# Patient Record
Sex: Male | Born: 1945
Health system: Southern US, Community
[De-identification: ages and names within clinical notes are randomized; demographics above are authoritative.]

## PROBLEM LIST (undated history)

## (undated) DIAGNOSIS — M129 Arthropathy, unspecified: Secondary | ICD-10-CM

## (undated) DIAGNOSIS — M1990 Unspecified osteoarthritis, unspecified site: Secondary | ICD-10-CM

## (undated) DIAGNOSIS — E785 Hyperlipidemia, unspecified: Secondary | ICD-10-CM

## (undated) DIAGNOSIS — G20 Parkinson's disease: Secondary | ICD-10-CM

## (undated) DIAGNOSIS — G20A1 Parkinson's disease without dyskinesia, without mention of fluctuations: Secondary | ICD-10-CM

## (undated) DIAGNOSIS — I10 Essential (primary) hypertension: Secondary | ICD-10-CM

## (undated) HISTORY — DX: Hyperlipidemia, unspecified: E78.5

## (undated) HISTORY — DX: Unspecified osteoarthritis, unspecified site: M19.90

## (undated) HISTORY — DX: Essential (primary) hypertension: I10

## (undated) HISTORY — DX: Arthropathy, unspecified: M12.9

## (undated) HISTORY — PX: OTHER SURGICAL HISTORY: SHX169

---

## 1977-01-14 HISTORY — PX: HERNIA REPAIR: SHX51

## 1997-01-14 HISTORY — PX: SPINE SURGERY: SHX786

## 1998-10-01 ENCOUNTER — Ambulatory Visit (HOSPITAL_COMMUNITY): Admission: RE | Admit: 1998-10-01 | Discharge: 1998-10-01 | Payer: Self-pay | Admitting: Neurosurgery

## 2004-06-27 ENCOUNTER — Emergency Department (HOSPITAL_COMMUNITY): Admission: EM | Admit: 2004-06-27 | Discharge: 2004-06-27 | Payer: Self-pay | Admitting: Emergency Medicine

## 2008-01-25 ENCOUNTER — Encounter: Payer: Self-pay | Admitting: Family Medicine

## 2008-08-01 DIAGNOSIS — M129 Arthropathy, unspecified: Secondary | ICD-10-CM

## 2008-08-01 DIAGNOSIS — I10 Essential (primary) hypertension: Secondary | ICD-10-CM

## 2008-08-01 DIAGNOSIS — M1990 Unspecified osteoarthritis, unspecified site: Secondary | ICD-10-CM

## 2008-08-01 HISTORY — DX: Essential (primary) hypertension: I10

## 2008-08-01 HISTORY — DX: Arthropathy, unspecified: M12.9

## 2008-08-01 HISTORY — DX: Unspecified osteoarthritis, unspecified site: M19.90

## 2008-11-21 ENCOUNTER — Ambulatory Visit: Payer: Self-pay | Admitting: Family Medicine

## 2008-11-21 DIAGNOSIS — E785 Hyperlipidemia, unspecified: Secondary | ICD-10-CM | POA: Insufficient documentation

## 2008-11-21 HISTORY — DX: Hyperlipidemia, unspecified: E78.5

## 2008-11-22 LAB — CONVERTED CEMR LAB
Albumin: 4.2 g/dL (ref 3.5–5.2)
Alkaline Phosphatase: 53 units/L (ref 39–117)
Basophils Relative: 0.1 % (ref 0.0–3.0)
Bilirubin, Direct: 0.1 mg/dL (ref 0.0–0.3)
Calcium: 9.4 mg/dL (ref 8.4–10.5)
Chloride: 103 meq/L (ref 96–112)
Creatinine, Ser: 0.8 mg/dL (ref 0.4–1.5)
Eosinophils Absolute: 0.1 10*3/uL (ref 0.0–0.7)
GFR calc non Af Amer: 104.11 mL/min (ref >60)
Glucose, Bld: 99 mg/dL (ref 70–99)
HCT: 43.4 % (ref 39.0–52.0)
HDL: 53.7 mg/dL (ref >39.00)
MCV: 95.2 fL (ref 78.0–100.0)
Monocytes Absolute: 0.5 10*3/uL (ref 0.1–1.0)
Monocytes Relative: 9.7 % (ref 3.0–12.0)
RDW: 12.1 % (ref 11.5–14.6)
TSH: 0.87 microintl units/mL (ref 0.35–5.50)
Total Bilirubin: 1 mg/dL (ref 0.3–1.2)
Total CHOL/HDL Ratio: 4
Total Protein: 7.5 g/dL (ref 6.0–8.3)
Triglycerides: 92 mg/dL (ref 0.0–149.0)
VLDL: 18.4 mg/dL (ref 0.0–40.0)
WBC: 5 10*3/uL (ref 4.5–10.5)

## 2009-01-09 ENCOUNTER — Telehealth: Payer: Self-pay | Admitting: Family Medicine

## 2009-02-08 ENCOUNTER — Telehealth: Payer: Self-pay | Admitting: Family Medicine

## 2009-03-01 ENCOUNTER — Ambulatory Visit: Payer: Self-pay | Admitting: Family Medicine

## 2009-03-03 ENCOUNTER — Telehealth: Payer: Self-pay | Admitting: Family Medicine

## 2009-12-22 ENCOUNTER — Ambulatory Visit: Payer: Self-pay | Admitting: Family Medicine

## 2009-12-28 LAB — CONVERTED CEMR LAB
ALT: 21 units/L (ref 0–53)
AST: 22 units/L (ref 0–37)
Alkaline Phosphatase: 60 units/L (ref 39–117)
BUN: 20 mg/dL (ref 6–23)
Basophils Absolute: 0 10*3/uL (ref 0.0–0.1)
Basophils Relative: 0.4 % (ref 0.0–3.0)
CO2: 29 meq/L (ref 19–32)
Chloride: 105 meq/L (ref 96–112)
Cholesterol: 178 mg/dL (ref 0–200)
Creatinine, Ser: 0.8 mg/dL (ref 0.4–1.5)
Eosinophils Absolute: 0.1 10*3/uL (ref 0.0–0.7)
GFR calc non Af Amer: 103.74 mL/min (ref >60.00)
Glucose, Bld: 90 mg/dL (ref 70–99)
HCT: 40.7 % (ref 39.0–52.0)
Potassium: 5 meq/L (ref 3.5–5.1)
RDW: 12.9 % (ref 11.5–14.6)
TSH: 1 microintl units/mL (ref 0.35–5.50)
Total Bilirubin: 0.8 mg/dL (ref 0.3–1.2)
Total CHOL/HDL Ratio: 4
Total Protein: 6.5 g/dL (ref 6.0–8.3)
VLDL: 16.8 mg/dL (ref 0.0–40.0)
WBC: 4.8 10*3/uL (ref 4.5–10.5)

## 2010-01-03 ENCOUNTER — Ambulatory Visit: Payer: Self-pay | Admitting: Family Medicine

## 2010-01-16 ENCOUNTER — Telehealth: Payer: Self-pay | Admitting: Family Medicine

## 2010-02-13 NOTE — Progress Notes (Signed)
Summary: REQ FOR REFILL ON MED  Phone Note Call from Patient   Caller: Patient [phone] Reason for Call: Refill Medication Summary of Call: Pt called to adv that he needs a refill on med :  lisinopril 10mg  .....[facility] [phone]..... [facility] adv [facility] that he would need to call drs office to get RX refilled??  Initial call taken by: Debbra Riding,  February 08, 2009 8:52 AM    Prescriptions: LISINOPRIL 10 MG TABS (LISINOPRIL) once daily  #90 x 3   Entered by:   Sid Falcon LPN   Authorized by:   Evelena Peat MD   Signed by:   Sid Falcon LPN on 47/82/9562   Method used:   Electronically to        [facility] [phone][facility] (retail)       [address]       [place], [place]  [address]       Ph: [phone] or [phone]       Fax: [phone]   RxID:   [id number]

## 2010-02-13 NOTE — Progress Notes (Signed)
Summary: ? about meds  Phone Note Call from Patient   Caller: Patient Call For: Evelena Peat MD Summary of Call: C/o diarrhea from Amoxicillin- has taken for several days and sinuses feel better. Can he stop abx? ph- [phone] Initial call taken by: Raechel Ache, RN,  March 03, 2009 8:54 AM  Follow-up for Phone Call        yes Follow-up by: Evelena Peat MD,  March 03, 2009 9:16 AM  Additional Follow-up for Phone Call Additional follow up Details #1::        Phone Call Completed Additional Follow-up by: Raechel Ache, RN,  March 03, 2009 9:21 AM

## 2010-02-13 NOTE — Assessment & Plan Note (Signed)
Summary: SINUSITIS? // RS   Vital Signs:  Patient profile:   65 year old male Temp:     98.2 degrees F oral BP sitting:   120 / 72  (left arm) Cuff size:   regular  Vitals Entered By: Sid Falcon LPN (March 01, 2009 3:08 PM) CC: Sinusitis symptoms X 2 plus weeks   History of Present Illness: Acute visit. Almost two-week history of facial pain and sinus congestion symptoms and postnasal drip. Yellow to bloody tinged mucus intermittently. No fever. Generalized malaise. Has taken Benadryl and Tylenol without much improvement. Wife being scheduled for total hip replacement in March  Allergies (verified): No Known Drug Allergies  Past History:  Past Medical History: Last updated: 11/21/2008 Erectile Dysfunction  Hypertension Osteoarthritis Arthritis Chicken pox Hyperlipidemia PMH reviewed for relevance  Review of Systems      See HPI  Physical Exam  General:  Well-developed,well-nourished,in no acute distress; alert,appropriate and cooperative throughout examination Eyes:  no injection.   Ears:  External ear exam shows no significant lesions or deformities.  Otoscopic examination reveals clear canals, tympanic membranes are intact bilaterally without bulging, retraction, inflammation or discharge. Hearing is grossly normal bilaterally. Nose:  no visible nasal discharge. Nonspecific mucosal erythema Mouth:  Oral mucosa and oropharynx without lesions or exudates.  Teeth in good repair. Neck:  No deformities, masses, or tenderness noted. Lungs:  Normal respiratory effort, chest expands symmetrically. Lungs are clear to auscultation, no crackles or wheezes. Heart:  normal rate, regular rhythm, and no murmur.     Impression & Recommendations:  Problem # 1:  SINUSITIS, ACUTE (ICD-461.9)  His updated medication list for this problem includes:    Amoxicillin-pot Clavulanate 875-125 Mg Tabs (Amoxicillin-pot clavulanate) ..... One by mouth two times a day for 10  days  Complete Medication List: 1)  Lisinopril 10 Mg Tabs (Lisinopril) .... Once daily 2)  Meloxicam 15 Mg Tabs (Meloxicam) .... As needed 3)  Amoxicillin-pot Clavulanate 875-125 Mg Tabs (Amoxicillin-pot clavulanate) .... One by mouth two times a day for 10 days  Patient Instructions: 1)  Consider over-the-counter use of Netti pot for saline irrigation of sinuses. 2)  Acute sinusitis symptoms for less than 10 days are not helped by antibiotics. Use warm moist compresses, and over the counter decongestants( only as directed). Call if no improvement in 5-7 days, sooner if increasing pain, fever, or new symptoms.  Prescriptions: AMOXICILLIN-POT CLAVULANATE 875-125 MG TABS (AMOXICILLIN-POT CLAVULANATE) one by mouth two times a day for 10 days  #20 x 0   Entered and Authorized by:   Evelena Peat MD   Signed by:   Evelena Peat MD on 03/01/2009   Method used:   [facility]ronically to        [facility] [phone][facility] (retail)       [address]       [place], [place]  [address]       Ph: [phone] or [phone]       Fax: [phone]   RxID:   [id number]

## 2010-02-15 NOTE — Assessment & Plan Note (Signed)
Summary: sinuses/ccm   Vital Signs:  Patient profile:   65 year old male Weight:      186 pounds Temp:     98.1 degrees F oral BP sitting:   112 / 78  (left arm) Cuff size:   regular  Vitals Entered By: Sid Falcon LPN (January 03, 2010 1:50 PM)  History of Present Illness: Some bloody discharge.  Saline nasal without relief.       This is a 65 year old man who presents with URI symptoms.  The patient complains of nasal congestion, purulent nasal discharge, productive cough, and earache.  The patient denies fever, stiff neck, and dyspnea.  The patient also reports muscle aches and severe fatigue.  The patient denies response to antihistamine.  Risk factors for Strep sinusitis include tooth pain.     Allergies (verified): No Known Drug Allergies  Past History:  Past Medical History: Last updated: 11/21/2008 Erectile Dysfunction  Hypertension Osteoarthritis Arthritis Chicken pox Hyperlipidemia PMH reviewed for relevance  Physical Exam  General:  Well-developed,well-nourished,in no acute distress; alert,appropriate and cooperative throughout examination Ears:  External ear exam shows no significant lesions or deformities.  Otoscopic examination reveals clear canals, tympanic membranes are intact bilaterally without bulging, retraction, inflammation or discharge. Hearing is grossly normal bilaterally. Nose:  erythema and yellowish d/c. Mouth:  Oral mucosa and oropharynx without lesions or exudates.  Teeth in good repair. Neck:  No deformities, masses, or tenderness noted. Lungs:  Normal respiratory effort, chest expands symmetrically. Lungs are clear to auscultation, no crackles or wheezes. Heart:  Normal rate and regular rhythm. S1 and S2 normal without gallop, murmur, click, rub or other extra sounds.   Impression & Recommendations:  Problem # 1:  SINUSITIS, ACUTE (ICD-461.9)  His updated medication list for this problem includes:    Amoxicillin-pot Clavulanate  875-125 Mg Tabs (Amoxicillin-pot clavulanate) ..... One by mouth two times a day with food for 10 days  Complete Medication List: 1)  Lisinopril 10 Mg Tabs (Lisinopril) .... Once daily 2)  Meloxicam 15 Mg Tabs (Meloxicam) .... As needed 3)  Amoxicillin-pot Clavulanate 875-125 Mg Tabs (Amoxicillin-pot clavulanate) .... One by mouth two times a day with food for 10 days  Anticoagulation Management Assessment/Plan:            Patient Instructions: 1)  Acute sinusitis symptoms for less than 10 days are not helped by antibiotics. Use warm moist compresses, and over the counter decongestants( only as directed). Call if no improvement in 5-7 days, sooner if increasing pain, fever, or new symptoms.  Prescriptions: AMOXICILLIN-POT CLAVULANATE 875-125 MG TABS (AMOXICILLIN-POT CLAVULANATE) one by mouth two times a day with food for 10 days  #20 x 0   Entered and Authorized by:   Evelena Peat MD   Signed by:   Evelena Peat MD on 01/03/2010   Method used:   [facility]ronically to        [facility] [phone][facility] (retail)       [address]       [place], [place]  [address]       Ph: [phone] or [phone]       Fax: [phone]   RxID:   [id number]    Orders Added: 1)  Est. Patient Level III [347[address]]

## 2010-02-15 NOTE — Assessment & Plan Note (Signed)
Summary: CPX (PT WILL COME IN FASTING) // RS   Vital Signs:  Patient profile:   65 year old male Height:      73.5 inches Weight:      189 pounds BMI:     24.69 O2 Sat:      97 % Temp:     97.6 degrees F Pulse rate:   58 / minute BP sitting:   126 / 80  (left arm) Cuff size:   regular  Vitals Entered By: Pura Spice, RN (December 22, 2009 8:45 AM) CC: cpx needs refills fasting for labs    History of Present Illness: Here for CPE.  PMH, SH, and FH reviewed. Pt refuses colonoscopy screening and understands risk of not screeing.  No regular exercise.   Clinical Review Panels:  Prevention   Last PSA:  0.44 (11/21/2008)  Immunizations   Last Tetanus Booster:  Historical (01/14/2006)  Lipid Management   Cholesterol:  188 (11/21/2008)   LDL (bad choesterol):  116 (11/21/2008)   HDL (good cholesterol):  53.70 (11/21/2008)  Diabetes Management   Creatinine:  0.8 (11/21/2008)  CBC   WBC:  5.0 (11/21/2008)   RBC:  4.56 (11/21/2008)   Hgb:  14.6 (11/21/2008)   Hct:  43.4 (11/21/2008)   Platelets:  230.0 (11/21/2008)   MCV  95.2 (11/21/2008)   MCHC  33.7 (11/21/2008)   RDW  12.1 (11/21/2008)   PMN:  60.5 (11/21/2008)   Lymphs:  28.6 (11/21/2008)   Monos:  9.7 (11/21/2008)   Eosinophils:  1.1 (11/21/2008)   Basophil:  0.1 (11/21/2008)  Complete Metabolic Panel   Glucose:  99 (11/21/2008)   Sodium:  142 (11/21/2008)   Potassium:  4.7 (11/21/2008)   Chloride:  103 (11/21/2008)   CO2:  31 (11/21/2008)   BUN:  15 (11/21/2008)   Creatinine:  0.8 (11/21/2008)   Albumin:  4.2 (11/21/2008)   Total Protein:  7.5 (11/21/2008)   Calcium:  9.4 (11/21/2008)   Total Bili:  1.0 (11/21/2008)   Alk Phos:  53 (11/21/2008)   SGPT (ALT):  20 (11/21/2008)   SGOT (AST):  26 (11/21/2008)   Allergies (verified): No Known Drug Allergies  Contraindications/Deferment of Procedures/Staging:    Test/Procedure: FLU VAX    Reason for deferment: patient declined   Past  History:  Past Medical History: Last updated: 11/21/2008 Erectile Dysfunction  Hypertension Osteoarthritis Arthritis Chicken pox Hyperlipidemia  Past Surgical History: Last updated: 08/01/2008 Inguinal Hernia Repair- 1979  lumbosacral dsk surgery- 1999   Family History: Last updated: 11/21/2008 Family history Leukemia  Family history Stroke  Family History of Alcoholism/Addiction Family History of Arthritis Family History Hypertension  Social History: Last updated: 11/21/2008 Never Smoked Alcohol use-no Occupation:  Engineer, mining Regular exercise-yes  Risk Factors: Exercise: yes (11/21/2008)  Risk Factors: Smoking Status: never (08/01/2008) PMH-FH-SH reviewed for relevance  Review of Systems  The patient denies anorexia, fever, weight loss, weight gain, vision loss, decreased hearing, hoarseness, chest pain, syncope, dyspnea on exertion, peripheral edema, prolonged cough, headaches, hemoptysis, abdominal pain, melena, hematochezia, severe indigestion/heartburn, hematuria, incontinence, genital sores, muscle weakness, suspicious skin lesions, transient blindness, difficulty walking, depression, unusual weight change, abnormal bleeding, enlarged lymph nodes, and testicular masses.         has occasional urine urgency but no incontinence. Arthritic pains mostly on the feet and hands. Occasional leg cramps  Physical Exam  General:  Well-developed,well-nourished,in no acute distress; alert,appropriate and cooperative throughout examination Head:  Normocephalic and atraumatic without obvious abnormalities.  No apparent alopecia or balding. Eyes:  No corneal or conjunctival inflammation noted. EOMI. Perrla. Funduscopic exam benign, without hemorrhages, exudates or papilledema. Vision grossly normal. Ears:  External ear exam shows no significant lesions or deformities.  Otoscopic examination reveals clear canals, tympanic membranes are intact bilaterally without  bulging, retraction, inflammation or discharge. Hearing is grossly normal bilaterally. Mouth:  Oral mucosa and oropharynx without lesions or exudates.  Teeth in good repair. Neck:  No deformities, masses, or tenderness noted. Lungs:  Normal respiratory effort, chest expands symmetrically. Lungs are clear to auscultation, no crackles or wheezes. Heart:  Normal rate and regular rhythm. S1 and S2 normal without gallop, murmur, click, rub or other extra sounds. Abdomen:  Bowel sounds positive,abdomen soft and non-tender without masses, organomegaly or hernias noted. Rectal:  No external abnormalities noted. Normal sphincter tone. No rectal masses or tenderness. Prostate:  Prostate gland firm and smooth, no enlargement, nodularity, tenderness, mass, asymmetry or induration. Msk:  No deformity or scoliosis noted of thoracic or lumbar spine.   Extremities:  patient has some hypertrophic changes left foot-related arthritis Neurologic:  alert & oriented X3, cranial nerves II-XII intact, and strength normal in all extremities.   Skin:  no rashes and no suspicious lesions.   Cervical Nodes:  No lymphadenopathy noted Psych:  normally interactive, good eye contact, not anxious appearing, and not depressed appearing.     Impression & Recommendations:  Problem # 1:  ROUTINE GENERAL MEDICAL [email or web address]@[email or web address]  CARE FACL (ICD-V70.0) obtain screening labs.  Discussed Zostavax and he is not interested.  He refuses colonoscopy. Orders: Venipuncture (40347) Specimen Handling (42595) TLB-Lipid Panel (80061-LIPID) TLB-BMP (Basic Metabolic Panel-BMET) (80048-METABOL) TLB-CBC Platelet - w/Differential (85025-CBCD) TLB-Hepatic/Liver Function Pnl (80076-HEPATIC) TLB-TSH (Thyroid Stimulating Hormone) (84443-TSH) TLB-PSA (Prostate Specific Antigen) (84153-PSA)  Complete Medication List: 1)  Lisinopril 10 Mg Tabs (Lisinopril) .... Once daily 2)  Meloxicam 15 Mg Tabs (Meloxicam) .... As needed  Patient Instructions: 1)   Please schedule a follow-up appointment in 1 year.  Prescriptions: MELOXICAM 15 MG TABS (MELOXICAM) as needed  #30 x 6   Entered and Authorized by:   Evelena Peat MD   Signed by:   Evelena Peat MD on 12/22/2009   Method used:   [facility] to        [facility] [phone][facility] (retail)       [address]       [place], [place]  [address]       Ph: [phone] or [phone]       Fax: [phone]   RxID:   [id number] LISINOPRIL 10 MG TABS (LISINOPRIL) once daily  #90 x 3   Entered and Authorized by:   Evelena Peat MD   Signed by:   Evelena Peat MD on 12/22/2009   Method used:   Electronically to        [facility] [phone]* (retail)       [address]       [place], [place]  [address]       Ph: [phone] or [phone]       Fax: [phone]   RxID:   [id number]    Orders Added: 1)  Est. Patient 40-64 years [99396] 2)  Venipuncture [85277] 3)  Specimen Handling [99000] 4)  TLB-Lipid Panel [80061-LIPID] 5)  TLB-BMP (Basic Metabolic Panel-BMET) [80048-METABOL] 6)  TLB-CBC Platelet - w/Differential [85025-CBCD] 7)  TLB-Hepatic/Liver Function Pnl [80076-HEPATIC] 8)  TLB-TSH (Thyroid Stimulating Hormone) [84443-TSH] 9)  TLB-PSA (Prostate Specific Antigen) [82423-NTI]

## 2010-02-15 NOTE — Progress Notes (Signed)
Summary: please advise  of sinuses  Phone Note Call from Patient Call back at Work Phone [phone]   Caller: Patient---live call Summary of Call: was given amoxil for sinuses. not any better. still coughing and draining down throat. please advise.      [facility] Initial call taken by: Warnell Forester,  January 16, 2010 11:32 AM  Follow-up for Phone Call        residual cough is very common following acute sinusitis and further antibiotics might not help.  IF he is having any persistent bloody nasal drainage, purulent secretions, or facial pain start Levaquin 500 mg by mouth once daily for 7 days and needs to be seen if no better after that. Follow-up by: Evelena Peat MD,  January 16, 2010 6:12 PM  Additional Follow-up for Phone Call Additional follow up Details #1::        Pt informed on personally identified VM, Rx called in Additional Follow-up by: Sid Falcon LPN,  January 17, 2010 12:43 PM    New/Updated Medications: LEVAQUIN 500 MG TABS (LEVOFLOXACIN) one tab daily for 7 days Prescriptions: LEVAQUIN 500 MG TABS (LEVOFLOXACIN) one tab daily for 7 days  #7 x 0   Entered by:   Sid Falcon LPN   Authorized by:   Evelena Peat MD   Signed by:   Sid Falcon LPN on 62/13/0865   Method used:   Electronically to        [facility] [phone]* (retail)       [address]       [place], [place]  [address]       Ph: [phone] or [phone]       Fax: [phone]   RxID:   [id number]

## 2011-01-01 ENCOUNTER — Other Ambulatory Visit: Payer: Self-pay | Admitting: *Deleted

## 2011-01-01 MED ORDER — LISINOPRIL 10 MG PO TABS: 10.0000 mg | ORAL_TABLET | Freq: Every day | ORAL | Status: DC

## 2011-01-29 ENCOUNTER — Other Ambulatory Visit (INDEPENDENT_AMBULATORY_CARE_PROVIDER_SITE_OTHER): Payer: BC Managed Care – PPO

## 2011-01-29 DIAGNOSIS — Z0000 Encounter for general adult medical examination without abnormal findings: Secondary | ICD-10-CM

## 2011-01-29 LAB — CBC WITH DIFFERENTIAL/PLATELET
Lymphocytes Relative: 33.9 % (ref 12.0–46.0)
Lymphs Abs: 1.7 10*3/uL (ref 0.7–4.0)
MCV: 93.7 fl (ref 78.0–100.0)
Monocytes Absolute: 0.7 10*3/uL (ref 0.1–1.0)
Monocytes Relative: 13.1 % — ABNORMAL HIGH (ref 3.0–12.0)
Neutro Abs: 2.5 10*3/uL (ref 1.4–7.7)
Neutrophils Relative %: 50.8 % (ref 43.0–77.0)
WBC: 5 10*3/uL (ref 4.5–10.5)

## 2011-01-29 LAB — POCT URINALYSIS DIPSTICK
Bilirubin, UA: NEGATIVE
Blood, UA: NEGATIVE
Glucose, UA: NEGATIVE
Ketones, UA: NEGATIVE
Leukocytes, UA: NEGATIVE
Nitrite, UA: NEGATIVE
Protein, UA: NEGATIVE
Spec Grav, UA: 1.02
Urobilinogen, UA: 0.2
pH, UA: 7

## 2011-01-29 LAB — TSH: TSH: 1.32 u[IU]/mL (ref 0.35–5.50)

## 2011-01-29 LAB — HEPATIC FUNCTION PANEL
ALT: 22 U/L (ref 0–53)
AST: 25 U/L (ref 0–37)
Albumin: 4 g/dL (ref 3.5–5.2)
Alkaline Phosphatase: 63 U/L (ref 39–117)
Bilirubin, Direct: 0.1 mg/dL (ref 0.0–0.3)
Total Bilirubin: 0.7 mg/dL (ref 0.3–1.2)
Total Protein: 6.8 g/dL (ref 6.0–8.3)

## 2011-01-29 LAB — BASIC METABOLIC PANEL
BUN: 18 mg/dL (ref 6–23)
Creatinine, Ser: 1 mg/dL (ref 0.4–1.5)
Sodium: 141 mEq/L (ref 135–145)

## 2011-01-29 LAB — LIPID PANEL
HDL: 49.4 mg/dL (ref >39.00)
LDL Cholesterol: 123 mg/dL — ABNORMAL HIGH (ref 0–99)
Total CHOL/HDL Ratio: 4
Triglycerides: 112 mg/dL (ref 0.0–149.0)
VLDL: 22.4 mg/dL (ref 0.0–40.0)

## 2011-01-29 LAB — PSA: PSA: 0.54 ng/mL (ref 0.10–4.00)

## 2011-02-05 ENCOUNTER — Other Ambulatory Visit: Payer: Self-pay | Admitting: Family Medicine

## 2011-02-07 ENCOUNTER — Encounter: Payer: Self-pay | Admitting: Family Medicine

## 2011-02-07 ENCOUNTER — Ambulatory Visit (INDEPENDENT_AMBULATORY_CARE_PROVIDER_SITE_OTHER): Payer: BC Managed Care – PPO | Admitting: Family Medicine

## 2011-02-07 VITALS — BP 150/80 | HR 80 | Temp 97.5°F | Resp 12 | Ht 71.75 in | Wt 187.0 lb

## 2011-02-07 DIAGNOSIS — Z0000 Encounter for general adult medical examination without abnormal findings: Secondary | ICD-10-CM

## 2011-02-07 DIAGNOSIS — E785 Hyperlipidemia, unspecified: Secondary | ICD-10-CM

## 2011-02-07 DIAGNOSIS — M25511 Pain in right shoulder: Secondary | ICD-10-CM

## 2011-02-07 DIAGNOSIS — R3915 Urgency of urination: Secondary | ICD-10-CM

## 2011-02-07 DIAGNOSIS — M25519 Pain in unspecified shoulder: Secondary | ICD-10-CM

## 2011-02-07 DIAGNOSIS — M1990 Unspecified osteoarthritis, unspecified site: Secondary | ICD-10-CM

## 2011-02-07 DIAGNOSIS — I10 Essential (primary) hypertension: Secondary | ICD-10-CM

## 2011-02-07 MED ORDER — LISINOPRIL 20 MG PO TABS: 20.0000 mg | ORAL_TABLET | Freq: Every day | ORAL | Status: DC

## 2011-02-07 NOTE — Progress Notes (Signed)
Subjective:    Patient ID: Bryce Wood, male    DOB: 06-Jun-1945, 66 y.o.   MRN: [id number]  HPI  Patient seen for complete physical and also to evaluate for some medical problems.  His history of hyperlipidemia, hypertension, and osteoarthritis. He remains on meloxicam 15 mg daily and lisinopril 10 mg daily. Not monitoring blood pressures. Has had some mild headache past couple days. No chest pains or dizziness.  Right shoulder pain for months. Does a lot of lifting. No injury. Increased night pain. Pain occasionally radiates toward deltoid. Pain with internal rotation and abduction. No clear weakness.  No associated neck pain.  Urinary urgency over the past couple of years. No burning with urination. No obstructive symptoms. No history of BPH.  No fever or chills.  Intermittent left foot pain. He has bony hypertrophic changes of the foot and has previously seen podiatrist with x-rays. Osteoarthritic changes. Managing with nonsteroidal. He is reluctant to consider surgery.  Health maintenance issues addressed. He refuses colonoscopy. Needs Pneumovax for next year. Is not sure if he has coverage for shingles vaccine but has previously had chickenpox.  Past Medical History  Diagnosis Date  . HYPERLIPIDEMIA 11/21/2008  . HYPERTENSION 08/01/2008  . OSTEOARTHRITIS 08/01/2008  . ARTHRITIS 08/01/2008   Past Surgical History  Procedure Date  . Hernia repair 1979  . Spine surgery 1999    disk    reports that he has never smoked. He does not have any smokeless tobacco history on file. His alcohol and drug histories not on file. family history includes Alcohol abuse in his other; Arthritis in his other; Cancer (age of onset:65) in his paternal uncle; Hypertension in his other; Leukemia in his other; and Stroke in his other. No Known Allergies    Review of Systems  Constitutional: Negative for fever, activity change, appetite change and fatigue.  HENT: Negative for ear pain, congestion and  trouble swallowing.   Eyes: Negative for pain and visual disturbance.  Respiratory: Negative for cough, shortness of breath and wheezing.   Cardiovascular: Negative for chest pain and palpitations.  Gastrointestinal: Negative for nausea, vomiting, abdominal pain, diarrhea, constipation, blood in stool, abdominal distention and rectal pain.  Genitourinary: Negative for dysuria, hematuria and testicular pain.  Musculoskeletal: Positive for arthralgias (Right shoulder). Negative for joint swelling.  Skin: Negative for rash.  Neurological: Negative for dizziness, syncope and headaches.  Hematological: Negative for adenopathy.  Psychiatric/Behavioral: Negative for confusion and dysphoric mood.       Objective:   Physical Exam  Constitutional: He is oriented to person, place, and time. He appears well-developed and well-nourished. No distress.  HENT:  Head: Normocephalic and atraumatic.  Right Ear: External ear normal.  Left Ear: External ear normal.  Mouth/Throat: Oropharynx is clear and moist.  Eyes: Conjunctivae and EOM are normal. Pupils are equal, round, and reactive to light.  Neck: Normal range of motion. Neck supple. No thyromegaly present.  Cardiovascular: Normal rate, regular rhythm and normal heart sounds.   No murmur heard. Pulmonary/Chest: No respiratory distress. He has no wheezes. He has no rales.  Abdominal: Soft. Bowel sounds are normal. He exhibits no distension and no mass. There is no tenderness. There is no rebound and no guarding.  Genitourinary: Rectum normal and prostate normal.  Musculoskeletal: He exhibits no edema.       No upper extremity muscle atrophy. Full range of motion right shoulder but pain with internal rotation and abduction. No definite weakness. No a.c. joint tenderness. No biceps tenderness.  Lymphadenopathy:    He has no cervical adenopathy.  Neurological: He is alert and oriented to person, place, and time. He displays normal reflexes. No cranial  nerve deficit.  Skin: No rash noted.  Psychiatric: He has a normal mood and affect.          Assessment & Plan:  #1 health maintenance. Labs reviewed with patient and favorable. Check on coverage for shingles vaccine. Patient refuses colonoscopy. Pneumovax next year #2 urinary urgency. Recent urine dipstick unremarkable. Discussed possible medication options but is not interested #3 left foot pain. Hypertrophic osteoarthritic changes. He will consider orthopedic referral if pain progresses  #4 hypertension. Poorly controlled. Titrate lisinopril 20 mg daily and reassess blood pressure one month #5 right shoulder pain. Suspect rotator cuff tendinitis. No definite weakness.  Discussed risks and benefits of corticosteroid injection and patient consented.  After prepping skin with betadine, injected 40 mg depomedrol and 2 cc of plain xylocaine with 23 gauge one and one half inch needle using posterior lateral approach and pt tolerate well. Reassess one month and x-rays then if no improvement

## 2011-03-13 ENCOUNTER — Encounter: Payer: Self-pay | Admitting: Family Medicine

## 2011-03-13 ENCOUNTER — Ambulatory Visit (INDEPENDENT_AMBULATORY_CARE_PROVIDER_SITE_OTHER): Payer: BC Managed Care – PPO | Admitting: Family Medicine

## 2011-03-13 DIAGNOSIS — M19072 Primary osteoarthritis, left ankle and foot: Secondary | ICD-10-CM

## 2011-03-13 DIAGNOSIS — I10 Essential (primary) hypertension: Secondary | ICD-10-CM

## 2011-03-13 DIAGNOSIS — M19079 Primary osteoarthritis, unspecified ankle and foot: Secondary | ICD-10-CM

## 2011-03-13 NOTE — Progress Notes (Signed)
  Subjective:    Patient ID: Bryce Wood, male    DOB: [birth year], 65 y.o.   MRN: [id number]  HPI  Followup hypertension. Recent titration lisinopril 20 mg daily. Tolerating well. Blood pressure is well controlled by home readings. Mostly 120s systolic and 70s diastolic. No dizziness. No headaches.  Patient has hypertrophic osteoarthritis changes left midfoot.  Has previously seen podiatrist. Starting to have more pain with ambulation. Works on his feet several hours per day. Requesting orthopedic referral.  Past Medical History  Diagnosis Date  . HYPERLIPIDEMIA 11/21/2008  . HYPERTENSION 08/01/2008  . OSTEOARTHRITIS 08/01/2008  . ARTHRITIS 08/01/2008   Past Surgical History  Procedure Date  . Hernia repair 1979  . Spine surgery 1999    disk    reports that he has never smoked. He does not have any smokeless tobacco history on file. His alcohol and drug histories not on file. family history includes Alcohol abuse in his other; Arthritis in his other; Cancer (age of onset:65) in his paternal uncle; Hypertension in his other; Leukemia in his other; and Stroke in his other. No Known Allergies   Review of Systems  Constitutional: Negative for fatigue.  Eyes: Negative for visual disturbance.  Respiratory: Negative for cough, chest tightness and shortness of breath.   Cardiovascular: Negative for chest pain, palpitations and leg swelling.  Neurological: Negative for dizziness, syncope, weakness, light-headedness and headaches.       Objective:   Physical Exam  Constitutional: He appears well-developed and well-nourished.  Cardiovascular: Normal rate and regular rhythm.   No murmur heard. Pulmonary/Chest: Effort normal. No respiratory distress. He has no wheezes. He has no rales.  Musculoskeletal:       Patient has hypertrophic bony changes left midfoot. No erythema. No warmth. Nontender.          Assessment & Plan:  #1 hypertension improved continue lisinopril 20 mg daily  continue home monitoring #2 hypertrophic ?osteoarthritis changes left foot refer to orthopedist

## 2011-03-20 ENCOUNTER — Encounter: Payer: Self-pay | Admitting: Family Medicine

## 2011-03-20 ENCOUNTER — Ambulatory Visit (INDEPENDENT_AMBULATORY_CARE_PROVIDER_SITE_OTHER): Payer: BC Managed Care – PPO | Admitting: Family Medicine

## 2011-03-20 VITALS — BP 138/90 | Temp 98.0°F | Wt 190.0 lb

## 2011-03-20 DIAGNOSIS — J329 Chronic sinusitis, unspecified: Secondary | ICD-10-CM

## 2011-03-20 MED ORDER — AMOXICILLIN 875 MG PO TABS: 875.0000 mg | ORAL_TABLET | Freq: Two times a day (BID) | ORAL | Status: DC

## 2011-03-20 NOTE — Patient Instructions (Signed)

## 2011-03-20 NOTE — Progress Notes (Signed)
  Subjective:    Patient ID: Bryce Wood, male    DOB: [birth year], 66 y.o.   MRN: [id number]  HPI  Acute visit. Patient relates progressive sinus symptoms. He's had almost 2 weeks of frontal sinus pressure occasional bloody nasal discharge. Occasional yellow tinged mucus. Mild intermittent headaches. Has taken Allegra without much relief. Inconsistent use of nasal saline. Minimal cough. No fever or chills. Has not tried any decongestants  Review of Systems  Constitutional: Negative for fever and chills.  HENT: Positive for congestion and sinus pressure. Negative for sore throat and voice change.   Respiratory: Negative for cough and shortness of breath.   Neurological: Positive for headaches.       Objective:   Physical Exam  Constitutional: He appears well-developed and well-nourished.  HENT:  Right Ear: External ear normal.  Left Ear: External ear normal.  Mouth/Throat: Oropharynx is clear and moist.  Neck: Neck supple.  Cardiovascular: Normal rate and regular rhythm.   Pulmonary/Chest: Effort normal and breath sounds normal. No respiratory distress. He has no wheezes. He has no rales.          Assessment & Plan:  Acute sinusitis. Amoxicillin 875 mg twice daily for 10 days. Continue saline nasal irrigation

## 2011-03-28 ENCOUNTER — Telehealth: Payer: Self-pay | Admitting: Family Medicine

## 2011-03-28 MED ORDER — AZITHROMYCIN 250 MG PO TABS
ORAL_TABLET | ORAL | Status: AC
Start: 2011-03-28 — End: 2011-04-02

## 2011-03-28 NOTE — Telephone Encounter (Signed)
Pt was give Amoxil on 3-6, #20 BID Please advise

## 2011-03-28 NOTE — Telephone Encounter (Signed)
Pt informed and voiced understanding

## 2011-03-28 NOTE — Telephone Encounter (Signed)
Okay to try decongestant.  Change to Zithromax (Z-pack)

## 2011-03-28 NOTE — Telephone Encounter (Signed)
Was was in a week ago and was put on amoxicillin (AMOXIL) 875 MG tablet, pt has one pill left and still is not feeling better, pt is still experiencing a lot of congestion and would like to know he another rx could be called in for him. Pt also would like to know if he shouldl be taking a decongestant. Please contact  [facility]

## 2011-05-16 ENCOUNTER — Encounter: Payer: Self-pay | Admitting: Family Medicine

## 2011-05-16 ENCOUNTER — Ambulatory Visit (INDEPENDENT_AMBULATORY_CARE_PROVIDER_SITE_OTHER): Payer: BC Managed Care – PPO | Admitting: Family Medicine

## 2011-05-16 VITALS — BP 110/68 | Temp 97.8°F | Wt 190.0 lb

## 2011-05-16 DIAGNOSIS — M25511 Pain in right shoulder: Secondary | ICD-10-CM

## 2011-05-16 DIAGNOSIS — M25519 Pain in unspecified shoulder: Secondary | ICD-10-CM

## 2011-05-16 NOTE — Progress Notes (Addendum)
  Subjective:    Patient ID: Bryce Wood, male    DOB: 03-Oct-1945, 66 y.o.   MRN: [id number]  HPI  Recurrent right shoulder pain. No injury. Pain radiates toward the right deltoid. Worse with abduction. Back in January with his physical we had injected this with cortisone he had great improvement and has only recently had some recurrence. Occasional night pain. No recent injury. No radiculopathy features. No weakness. No alleviating factors. Exacerbated by reaching overhead. No numbness or right upper extremity weakness.   Review of Systems  Constitutional: Negative for fever and appetite change.  HENT: Negative for neck pain.   Hematological: Negative for adenopathy.       Objective:   Physical Exam  Constitutional: He appears well-developed and well-nourished.  Cardiovascular: Normal rate and regular rhythm.   Pulmonary/Chest: Effort normal and breath sounds normal. No respiratory distress. He has no wheezes. He has no rales.  Musculoskeletal:       Right shoulder reveals no visible swelling. No localized tenderness. Full range of motion. Pain with abduction greater than 80 and 90 with resistance. No biceps tenderness. No pain with internal rotation or external rotation          Assessment & Plan:  Recurrent right shoulder pain. Suspect recurrent tendinitis.  Discussed risks and benefits of corticosteroid injection and patient consented.  After prepping skin with betadine, injected 40 mg depomedrol and 2 cc of plain xylocaine with 22 gauge one and one half inch needle using posterior lateral approach and pt tolerate well.  Discussed some simple rotator cuff exercises for stretching and strengthening. X-rays if not greatly improved in 2 weeks   Ongoing shoulder pain.  Will refer to [facility] ortho group.

## 2011-06-07 ENCOUNTER — Ambulatory Visit (INDEPENDENT_AMBULATORY_CARE_PROVIDER_SITE_OTHER)
Admission: RE | Admit: 2011-06-07 | Discharge: 2011-06-07 | Disposition: A | Discharge status: Home / Self Care | Payer: BC Managed Care – PPO | Source: Ambulatory Visit | Attending: Family Medicine | Admitting: Family Medicine

## 2011-06-07 DIAGNOSIS — M25519 Pain in unspecified shoulder: Secondary | ICD-10-CM

## 2011-06-07 DIAGNOSIS — M25511 Pain in right shoulder: Secondary | ICD-10-CM

## 2011-06-11 NOTE — Progress Notes (Signed)
Addended by: Kristian Covey on: 06/11/2011 04:08 PM   Modules accepted: Orders

## 2011-07-04 ENCOUNTER — Other Ambulatory Visit: Payer: Self-pay | Admitting: Orthopedic Surgery

## 2011-07-04 DIAGNOSIS — M25511 Pain in right shoulder: Secondary | ICD-10-CM

## 2011-07-08 ENCOUNTER — Ambulatory Visit
Admission: RE | Admit: 2011-07-08 | Discharge: 2011-07-08 | Disposition: A | Discharge status: Home / Self Care | Payer: BC Managed Care – PPO | Source: Ambulatory Visit | Attending: Orthopedic Surgery | Admitting: Orthopedic Surgery

## 2011-07-08 DIAGNOSIS — M25511 Pain in right shoulder: Secondary | ICD-10-CM

## 2011-07-11 ENCOUNTER — Other Ambulatory Visit: Payer: BC Managed Care – PPO

## 2012-02-04 ENCOUNTER — Other Ambulatory Visit (INDEPENDENT_AMBULATORY_CARE_PROVIDER_SITE_OTHER): Payer: BC Managed Care – PPO

## 2012-02-04 DIAGNOSIS — D649 Anemia, unspecified: Secondary | ICD-10-CM

## 2012-02-04 DIAGNOSIS — N400 Enlarged prostate without lower urinary tract symptoms: Secondary | ICD-10-CM

## 2012-02-04 DIAGNOSIS — E039 Hypothyroidism, unspecified: Secondary | ICD-10-CM

## 2012-02-04 DIAGNOSIS — E785 Hyperlipidemia, unspecified: Secondary | ICD-10-CM

## 2012-02-04 DIAGNOSIS — Z0000 Encounter for general adult medical examination without abnormal findings: Secondary | ICD-10-CM

## 2012-02-04 DIAGNOSIS — I10 Essential (primary) hypertension: Secondary | ICD-10-CM

## 2012-02-04 LAB — LIPID PANEL: VLDL: 26.6 mg/dL (ref 0.0–40.0)

## 2012-02-04 LAB — POCT URINALYSIS DIPSTICK
Bilirubin, UA: NEGATIVE
Blood, UA: NEGATIVE
Glucose, UA: NEGATIVE
Urobilinogen, UA: 0.2
pH, UA: 7

## 2012-02-04 LAB — CBC WITH DIFFERENTIAL/PLATELET
Basophils Relative: 0.6 % (ref 0.0–3.0)
Eosinophils Relative: 1.6 % (ref 0.0–5.0)
MCV: 91.4 fl (ref 78.0–100.0)
Monocytes Absolute: 0.6 10*3/uL (ref 0.1–1.0)
Platelets: 281 10*3/uL (ref 150.0–400.0)
WBC: 5.9 10*3/uL (ref 4.5–10.5)

## 2012-02-04 LAB — HEPATIC FUNCTION PANEL
ALT: 20 U/L (ref 0–53)
Alkaline Phosphatase: 64 U/L (ref 39–117)
Bilirubin, Direct: 0 mg/dL (ref 0.0–0.3)
Total Bilirubin: 0.6 mg/dL (ref 0.3–1.2)

## 2012-02-04 LAB — BASIC METABOLIC PANEL
BUN: 19 mg/dL (ref 6–23)
CO2: 28 mEq/L (ref 19–32)
Calcium: 9.3 mg/dL (ref 8.4–10.5)
Creatinine, Ser: 0.8 mg/dL (ref 0.4–1.5)
Glucose, Bld: 98 mg/dL (ref 70–99)

## 2012-02-11 ENCOUNTER — Other Ambulatory Visit: Payer: Self-pay | Admitting: Family Medicine

## 2012-02-11 ENCOUNTER — Encounter: Payer: Self-pay | Admitting: Family Medicine

## 2012-02-11 ENCOUNTER — Ambulatory Visit (INDEPENDENT_AMBULATORY_CARE_PROVIDER_SITE_OTHER): Payer: BC Managed Care – PPO | Admitting: Family Medicine

## 2012-02-11 VITALS — BP 120/70 | HR 72 | Temp 97.6°F | Resp 12 | Ht 72.5 in | Wt 199.0 lb

## 2012-02-11 DIAGNOSIS — E785 Hyperlipidemia, unspecified: Secondary | ICD-10-CM

## 2012-02-11 DIAGNOSIS — Z23 Encounter for immunization: Secondary | ICD-10-CM

## 2012-02-11 DIAGNOSIS — Z0000 Encounter for general adult medical examination without abnormal findings: Secondary | ICD-10-CM

## 2012-02-11 DIAGNOSIS — I10 Essential (primary) hypertension: Secondary | ICD-10-CM

## 2012-02-11 MED ORDER — LISINOPRIL 20 MG PO TABS: 20.0000 mg | ORAL_TABLET | Freq: Every day | ORAL | Status: DC

## 2012-02-11 NOTE — Progress Notes (Signed)
Subjective:    Patient ID: Bryce Wood, male    DOB: [birth year], 67 y.o.   MRN: [id number]  HPI Patient here for complete physical. He has hypertension treated with lisinopril 20 mg daily. Blood pressure stable. Mild hyperlipidemia. No hx of CAD or PVD .  Osteoarthritis and takes glucosamine Chondroitin which helps.No Pneumovax.:  No hx of Shingles vaccine. He has declined colonoscopy screening in the past. Tetanus is up-to-date.  Past Medical History  Diagnosis Date  . HYPERLIPIDEMIA 11/21/2008  . HYPERTENSION 08/01/2008  . OSTEOARTHRITIS 08/01/2008  . ARTHRITIS 08/01/2008   Past Surgical History  Procedure Date  . Hernia repair 1979  . Spine surgery 1999    disk    reports that he has never smoked. He does not have any smokeless tobacco history on file. His alcohol and drug histories not on file. family history includes Alcohol abuse in his other; Arthritis in his other; Cancer (age of onset:65) in his paternal uncle; Hypertension in his other; Leukemia in his other; and Stroke in his other. No Known Allergies  1.  Risk factors based on Past Medical , Social, and Family history reviewed and as above. 2.  Limitations in physical activities None.  Works full time.  Walks some for exercise.  Low risk for falls. 3.  Depression/mood no mood or anxiety issues. 4.  Hearing No significant deficits. 5.  ADLs independent in all. 6.  Cognitive function (orientation to time and place, language, writing, speech,memory) no deficits. 7.  Home Safety no issues identified. 8.  Height, weight, and visual acuity. All stable. 9.  Counseling discussed reduction of sugars and starches and more regular exercise. 10. Recommendation of preventive services. Discussed colonoscopy, shingles vaccine and flu vaccine and he declines all. 11. Labs based on risk factors lipids, hepatic, PSA, TSH, CBC, BMP all reviewed with pt. 12. Care Plan as above.    Review of Systems  Constitutional: Negative for fever,  activity change, appetite change and fatigue.  HENT: Negative for ear pain, congestion and trouble swallowing.   Eyes: Negative for pain and visual disturbance.  Respiratory: Negative for cough, shortness of breath and wheezing.   Cardiovascular: Negative for chest pain and palpitations.  Gastrointestinal: Negative for nausea, vomiting, abdominal pain, diarrhea, constipation, blood in stool, abdominal distention and rectal pain.  Genitourinary: Negative for dysuria, hematuria and testicular pain.  Musculoskeletal: Negative for joint swelling and arthralgias.  Skin: Negative for rash.  Neurological: Negative for dizziness, syncope and headaches.  Hematological: Negative for adenopathy.  Psychiatric/Behavioral: Negative for confusion and dysphoric mood.       Objective:   Physical Exam  Constitutional: He is oriented to person, place, and time. He appears well-developed and well-nourished. No distress.  HENT:  Head: Normocephalic and atraumatic.  Right Ear: External ear normal.  Left Ear: External ear normal.  Mouth/Throat: Oropharynx is clear and moist.  Eyes: Conjunctivae normal and EOM are normal. Pupils are equal, round, and reactive to light.  Neck: Normal range of motion. Neck supple. No thyromegaly present.  Cardiovascular: Normal rate, regular rhythm and normal heart sounds.   No murmur heard. Pulmonary/Chest: No respiratory distress. He has no wheezes. He has no rales.  Abdominal: Soft. Bowel sounds are normal. He exhibits no distension and no mass. There is no tenderness. There is no rebound and no guarding.  Genitourinary: Rectum normal and prostate normal.  Musculoskeletal: He exhibits no edema.  Lymphadenopathy:    He has no cervical adenopathy.  Neurological: He is alert and  oriented to person, place, and time. He displays normal reflexes. No cranial nerve deficit.  Skin: No rash noted.  Psychiatric: He has a normal mood and affect.          Assessment & Plan:    Healthy 67 year old male. Pneumovax given. We've recommended yearly flu vaccine and he declines. He also declined shingles vaccine and colonoscopy. He declines Hemoccult cards. We discussed regular exercise.  Refill lisinopril for one year Hypertension-stable.  Refilled lisinopril for one year. Hyperlipidemia- discussed pros and cons of statin therapy and pt has decided he does not wish to pursue statins.

## 2012-02-11 NOTE — Patient Instructions (Addendum)
Let me know if you change your mind regarding colonoscopy screening

## 2012-07-13 ENCOUNTER — Ambulatory Visit (INDEPENDENT_AMBULATORY_CARE_PROVIDER_SITE_OTHER): Payer: BC Managed Care – PPO | Admitting: Family Medicine

## 2012-07-13 ENCOUNTER — Encounter: Payer: Self-pay | Admitting: Family Medicine

## 2012-07-13 VITALS — BP 130/76 | HR 76 | Temp 97.9°F | Ht 73.0 in | Wt 194.0 lb

## 2012-07-13 DIAGNOSIS — H8110 Benign paroxysmal vertigo, unspecified ear: Secondary | ICD-10-CM

## 2012-07-13 NOTE — Progress Notes (Signed)
  Subjective:    Patient ID: Bryce Wood, male    DOB: [birth year], 67 y.o.   MRN: [id number]  HPI Patient seen with vertigo symptoms. Onset couple days ago. Symptoms are early morning and then improves as the day goes on Very similar symptoms in the past Denies any headaches, confusion, ataxia, focal weakness, speech changes, or dysphagia. No recent hearing changes. Patient for years has taken Bonine which he thinks helps. Currently does not have any symptoms. Denies any recent chest pains. No orthostatic symptoms.  His only regular medication is lisinopril for hypertension. Blood pressures been well controlled  Past Medical History  Diagnosis Date  . HYPERLIPIDEMIA 11/21/2008  . HYPERTENSION 08/01/2008  . OSTEOARTHRITIS 08/01/2008  . ARTHRITIS 08/01/2008   Past Surgical History  Procedure Laterality Date  . Hernia repair  1979  . Spine surgery  1999    disk    reports that he has never smoked. He does not have any smokeless tobacco history on file. His alcohol and drug histories are not on file. family history includes Alcohol abuse in his other; Arthritis in his other; Cancer (age of onset: 13) in his paternal uncle; Hypertension in his other; Leukemia in his other; and Stroke in his other. No Known Allergies    Review of Systems  Constitutional: Negative for fatigue.  Eyes: Negative for visual disturbance.  Respiratory: Negative for cough, chest tightness and shortness of breath.   Cardiovascular: Negative for chest pain, palpitations and leg swelling.  Neurological: Positive for dizziness. Negative for syncope, weakness, light-headedness and headaches.       Objective:   Physical Exam  Constitutional: He is oriented to person, place, and time. He appears well-developed and well-nourished.  HENT:  Right Ear: External ear normal.  Left Ear: External ear normal.  Mouth/Throat: Oropharynx is clear and moist.  Eyes: Pupils are equal, round, and reactive to light.  Neck:  Neck supple.  No carotid bruits  Cardiovascular: Normal rate and regular rhythm.  Exam reveals no gallop.   Pulmonary/Chest: Effort normal and breath sounds normal. No respiratory distress. He has no wheezes. He has no rales.  Neurological: He is alert and oriented to person, place, and time. No cranial nerve deficit. Coordination normal.  Full-strength throughout. Normal gait. Normal cerebellar function          Assessment & Plan:  Transient vertigo. Suspect benign positional vertigo. Nonfocal exam. Reassurance. Consider vestibular rehabilitation if symptoms persist

## 2012-07-13 NOTE — Patient Instructions (Addendum)

## 2012-07-16 ENCOUNTER — Telehealth: Payer: Self-pay | Admitting: Family Medicine

## 2012-07-16 DIAGNOSIS — R42 Dizziness and giddiness: Secondary | ICD-10-CM

## 2012-07-16 NOTE — Telephone Encounter (Signed)
Pt is requesting ENT referral for vertigo.

## 2012-07-20 NOTE — Telephone Encounter (Signed)
Pt calling in to check status of requested referral.

## 2012-07-20 NOTE — Telephone Encounter (Signed)
Spoke to pt he is aware that the referral has been placed and that [facility] will be contacting him

## 2012-07-20 NOTE — Telephone Encounter (Signed)
Let pt know I have made referral.

## 2012-08-31 ENCOUNTER — Telehealth: Payer: Self-pay | Admitting: Family Medicine

## 2012-08-31 NOTE — Telephone Encounter (Signed)
Patient brought in a patient screening form 2wks ago. This had to be completed by MD and faxed to [facility]. Patient states we did this. However, he received a letter from [facility] stating the form was not completed correctly. I see no copy of this form in the chart, or anywhere. Please advise.

## 2012-08-31 NOTE — Telephone Encounter (Signed)
Will resend the form back to [employer]

## 2012-09-24 ENCOUNTER — Telehealth: Payer: Self-pay | Admitting: Family Medicine

## 2012-09-24 MED ORDER — MELOXICAM 15 MG PO TABS: 15.0000 mg | ORAL_TABLET | Freq: Every day | ORAL | Status: DC

## 2012-09-24 NOTE — Telephone Encounter (Signed)
Ok to refill Meloxicam 15 mg po once daily prn

## 2012-09-24 NOTE — Telephone Encounter (Signed)
Is it okay, currently not on patient current medication list. Mobic 15mg  take by mouth as needed

## 2012-09-24 NOTE — Telephone Encounter (Signed)
RX sent to pharmacy  

## 2012-09-24 NOTE — Telephone Encounter (Signed)
PT called and stated that he previously requested to end his meloxicam (MOBIC) 15 MG tablet prescription. But has since then, found that his arthritis has worsened. [facility] [address]. Please assist.

## 2012-10-14 ENCOUNTER — Encounter: Payer: Self-pay | Admitting: Family Medicine

## 2012-10-14 ENCOUNTER — Ambulatory Visit (INDEPENDENT_AMBULATORY_CARE_PROVIDER_SITE_OTHER): Payer: Medicare Other | Admitting: Family Medicine

## 2012-10-14 VITALS — BP 122/80 | HR 76 | Temp 98.1°F | Wt 195.0 lb

## 2012-10-14 DIAGNOSIS — IMO0002 Reserved for concepts with insufficient information to code with codable children: Secondary | ICD-10-CM

## 2012-10-14 DIAGNOSIS — M5416 Radiculopathy, lumbar region: Secondary | ICD-10-CM

## 2012-10-14 MED ORDER — MELOXICAM 15 MG PO TABS: 15.0000 mg | ORAL_TABLET | Freq: Every day | ORAL | Status: DC

## 2012-10-14 NOTE — Patient Instructions (Addendum)
Sciatica °Sciatica is pain, weakness, numbness, or tingling along the path of the sciatic nerve. The nerve starts in the lower back and runs down the back of each leg. The nerve controls the muscles in the lower leg and in the back of the knee, while also providing sensation to the back of the thigh, lower leg, and the sole of your foot. Sciatica is a symptom of another medical condition. For instance, nerve damage or certain conditions, such as a herniated disk or bone spur on the spine, pinch or put pressure on the sciatic nerve. This causes the pain, weakness, or other sensations normally associated with sciatica. Generally, sciatica only affects one side of the body. °CAUSES  °· Herniated or slipped disc. °· Degenerative disk disease. °· A pain disorder involving the narrow muscle in the buttocks (piriformis syndrome). °· Pelvic injury or fracture. °· Pregnancy. °· Tumor (rare). °SYMPTOMS  °Symptoms can vary from mild to very severe. The symptoms usually travel from the low back to the buttocks and down the back of the leg. Symptoms can include: °· Mild tingling or dull aches in the lower back, leg, or hip. °· Numbness in the back of the calf or sole of the foot. °· Burning sensations in the lower back, leg, or hip. °· Sharp pains in the lower back, leg, or hip. °· Leg weakness. °· Severe back pain inhibiting movement. °These symptoms may get worse with coughing, sneezing, laughing, or prolonged sitting or standing. Also, being overweight may worsen symptoms. °DIAGNOSIS  °Your caregiver will perform a physical exam to look for common symptoms of sciatica. He or she may ask you to do certain movements or activities that would trigger sciatic nerve pain. Other tests may be performed to find the cause of the sciatica. These may include: °· Blood tests. °· X-rays. °· Imaging tests, such as an MRI or CT scan. °TREATMENT  °Treatment is directed at the cause of the sciatic pain. Sometimes, treatment is not necessary  and the pain and discomfort goes away on its own. If treatment is needed, your caregiver may suggest: °· Over-the-counter medicines to relieve pain. °· Prescription medicines, such as anti-inflammatory medicine, muscle relaxants, or narcotics. °· Applying heat or ice to the painful area. °· Steroid injections to lessen pain, irritation, and inflammation around the nerve. °· Reducing activity during periods of pain. °· Exercising and stretching to strengthen your abdomen and improve flexibility of your spine. Your caregiver may suggest losing weight if the extra weight makes the back pain worse. °· Physical therapy. °· Surgery to eliminate what is pressing or pinching the nerve, such as a bone spur or part of a herniated disk. °HOME CARE INSTRUCTIONS  °· Only take over-the-counter or prescription medicines for pain or discomfort as directed by your caregiver. °· Apply ice to the affected area for 20 minutes, 3 4 times a day for the first 48 72 hours. Then try heat in the same way. °· Exercise, stretch, or perform your usual activities if these do not aggravate your pain. °· Attend physical therapy sessions as directed by your caregiver. °· Keep all follow-up appointments as directed by your caregiver. °· Do not wear high heels or shoes that do not provide proper support. °· Check your mattress to see if it is too soft. A firm mattress may lessen your pain and discomfort. °SEEK IMMEDIATE MEDICAL CARE IF:  °· You lose control of your bowel or bladder (incontinence). °· You have increasing weakness in the lower back,   pelvis, buttocks, or legs. °· You have redness or swelling of your back. °· You have a burning sensation when you urinate. °· You have pain that gets worse when you lie down or awakens you at night. °· Your pain is worse than you have experienced in the past. °· Your pain is lasting longer than 4 weeks. °· You are suddenly losing weight without reason. °MAKE SURE YOU: °· Understand these  instructions. °· Will watch your condition. °· Will get help right away if you are not doing well or get worse. °Document Released: 12/25/2000 Document Revised: 07/02/2011 Document Reviewed: 05/12/2011 °[employer]® Patient Information ©2014 [employer]. ° °

## 2012-10-15 NOTE — Progress Notes (Signed)
  Subjective:    Patient ID: Bryce Wood, male    DOB: 14-Nov-1945, 67 y.o.   MRN: [id number]  HPI Patient seen with intermittent dysesthesias and pain right lower leg. Location is right knee down to the foot. Symptoms have been present for about 2 weeks. He has had somewhat similar symptoms in the past. Denies any fever, chills, appetite or weight changes, dysuria. He describes a tingling sensation with location right lateral leg. Possibly some mild weakness with dorsiflexion on the right. Minimal lumbar back pain off and on. He has Mobic but has not taken of the past week or so.  Past Medical History  Diagnosis Date  . HYPERLIPIDEMIA 11/21/2008  . HYPERTENSION 08/01/2008  . OSTEOARTHRITIS 08/01/2008  . ARTHRITIS 08/01/2008   Past Surgical History  Procedure Laterality Date  . Hernia repair  1979  . Spine surgery  1999    disk    reports that he has never smoked. He does not have any smokeless tobacco history on file. His alcohol and drug histories are not on file. family history includes Alcohol abuse in his other; Arthritis in his other; Cancer (age of onset: 44) in his paternal uncle; Hypertension in his other; Leukemia in his other; Stroke in his other. No Known Allergies    Review of Systems  Constitutional: Negative for fever, activity change and appetite change.  Respiratory: Negative for cough and shortness of breath.   Cardiovascular: Negative for chest pain and leg swelling.  Gastrointestinal: Negative for vomiting and abdominal pain.  Genitourinary: Negative for dysuria, hematuria and flank pain.  Musculoskeletal: Positive for back pain. Negative for joint swelling.  Neurological: Positive for numbness.  Hematological: Negative for adenopathy.       Objective:   Physical Exam  Constitutional: He appears well-developed and well-nourished.  Cardiovascular: Normal rate and regular rhythm.   Pulmonary/Chest: Effort normal and breath sounds normal. No respiratory  distress. He has no wheezes. He has no rales.  Musculoskeletal: He exhibits no edema.  Straight leg raises are negative bilaterally  Neurological:  Knee reflexes are 2+ bilaterally. He has slightly diminished right ankle reflex compared to the left. Also very mild weakness with right dorsiflexion compared to the left, otherwise strength intact          Assessment & Plan:  Right lumbar radiculopathy symptoms. Suggested starting back Mobic 15 mg daily. Avoid active back flexion. Try some extension stretches. Touch base in 2-3 weeks if not improving

## 2012-11-02 ENCOUNTER — Telehealth: Payer: Self-pay | Admitting: Family Medicine

## 2012-11-02 NOTE — Telephone Encounter (Signed)
Pt was seen on 10/1 for right lumbar radiculitis. Pt is still having the problem. Pt would like to know the next step ?mri

## 2012-11-03 NOTE — Telephone Encounter (Signed)
We can set up MRI but if no weakness and symptoms are relatively mild, I would give this another few weeks as abnormal MRI finding would not change management unless the above noted.

## 2012-11-03 NOTE — Telephone Encounter (Signed)
Left message for patient to return call.

## 2012-11-04 NOTE — Telephone Encounter (Signed)
Pt informed

## 2012-11-16 ENCOUNTER — Encounter: Payer: Self-pay | Admitting: Family Medicine

## 2012-11-16 ENCOUNTER — Ambulatory Visit (INDEPENDENT_AMBULATORY_CARE_PROVIDER_SITE_OTHER): Payer: BC Managed Care – PPO | Admitting: Family Medicine

## 2012-11-16 VITALS — BP 134/80 | HR 66 | Temp 97.5°F | Wt 198.0 lb

## 2012-11-16 DIAGNOSIS — IMO0002 Reserved for concepts with insufficient information to code with codable children: Secondary | ICD-10-CM

## 2012-11-16 DIAGNOSIS — M5416 Radiculopathy, lumbar region: Secondary | ICD-10-CM

## 2012-11-16 NOTE — Progress Notes (Signed)
  Subjective:    Patient ID: Bryce Wood, male    DOB: [birth year], 67 y.o.   MRN: [id number]  HPI  Patient presents with persistent right lower extremity lumbar radiculopathy symptoms. Onset over 6 weeks ago. He has taken meloxicam which helps but minimally. Symptoms have been progressive. He has intermittent numbness from the right knee down to the foot. Question weakness off and on. He has chronic decreased ankle reflex on the right compared to the left. Remote surgery- question L4-L5 many years ago. Pain is with ambulation but also some at rest. He's tried some home stretches without much improvement. No loss of bladder or bowel control. No fever. No appetite or weight changes.  Past Medical History  Diagnosis Date  . HYPERLIPIDEMIA 11/21/2008  . HYPERTENSION 08/01/2008  . OSTEOARTHRITIS 08/01/2008  . ARTHRITIS 08/01/2008   Past Surgical History  Procedure Laterality Date  . Hernia repair  1979  . Spine surgery  1999    disk    reports that he has never smoked. He does not have any smokeless tobacco history on file. His alcohol and drug histories are not on file. family history includes Alcohol abuse in his other; Arthritis in his other; Cancer (age of onset: 98) in his paternal uncle; Hypertension in his other; Leukemia in his other; Stroke in his other. No Known Allergies   Review of Systems  Constitutional: Negative for fever, chills, appetite change, fatigue and unexpected weight change.  Musculoskeletal: Positive for back pain.  Neurological: Positive for weakness and numbness.       Objective:   Physical Exam  Constitutional: He appears well-developed and well-nourished.  Cardiovascular: Normal rate and regular rhythm.   Pulmonary/Chest: Effort normal and breath sounds normal. No respiratory distress. He has no wheezes. He has no rales.  Musculoskeletal: He exhibits no edema.  Straight leg raises are negative  Neurological:  Diminished right ankle reflex compared to  left. Symmetric knee reflexes. Full-strength with plantar flexion, dorsiflexion, and knee extension bilaterally          Assessment & Plan:  Progressive right lumbar radiculopathy symptoms with diminished right ankle reflex (?chronic). Plan- set up MRI to further assess

## 2012-11-16 NOTE — Patient Instructions (Signed)

## 2012-11-17 ENCOUNTER — Telehealth: Payer: Self-pay | Admitting: Family Medicine

## 2012-11-17 NOTE — Telephone Encounter (Signed)
Pt states he will need something for pain due to mri not being scheduled for another week . Pain in back and leg Pharm  [facility]

## 2012-11-17 NOTE — Telephone Encounter (Addendum)
Pt states dr Caryl Never advised pt [facility] had a more open machine for mri and pt is claustrophobic Can that be changed to [facility]? Pt would prefer.

## 2012-11-18 ENCOUNTER — Other Ambulatory Visit: Payer: Self-pay

## 2012-11-18 MED ORDER — HYDROCODONE-ACETAMINOPHEN 5-325 MG PO TABS: 1.0000 | ORAL_TABLET | Freq: Four times a day (QID) | ORAL | Status: DC | PRN

## 2012-11-18 NOTE — Telephone Encounter (Signed)
Vicodin 5/325 mg 1-2 po q 6 hours prn pain disp #30 with no refill.

## 2012-11-18 NOTE — Telephone Encounter (Signed)
Patient is aware that RX is ready for pick-up. °

## 2012-11-18 NOTE — Telephone Encounter (Signed)
I dont see that patient is currently taking any pain meds on his current medication list

## 2012-11-21 ENCOUNTER — Other Ambulatory Visit: Payer: BC Managed Care – PPO

## 2012-11-23 ENCOUNTER — Telehealth: Payer: Self-pay | Admitting: Family Medicine

## 2012-11-23 DIAGNOSIS — M5416 Radiculopathy, lumbar region: Secondary | ICD-10-CM

## 2012-11-23 NOTE — Telephone Encounter (Signed)
Call [facility] and have faxed over. Let pt know we have not received his report yet.

## 2012-11-23 NOTE — Telephone Encounter (Signed)
The report is in your folder.

## 2012-11-23 NOTE — Telephone Encounter (Signed)
Pt would like MRI results. Pt had mri at [facility] on 11-19-12

## 2012-11-23 NOTE — Telephone Encounter (Signed)
Patient notified. MRI reveals posterior focal right paracentral disc extrusion versus scar L3-L4 impinging right neural foramen and corresponding nerve roots. He has some degenerative changes at other levels. His pain remains quite severe. We discussed setting up neurosurgical referral and he agrees with this plan

## 2012-11-25 ENCOUNTER — Telehealth: Payer: Self-pay

## 2012-11-25 ENCOUNTER — Other Ambulatory Visit: Payer: BC Managed Care – PPO

## 2012-11-25 NOTE — Telephone Encounter (Signed)
Patient a VM on phone asking for you to give him a call in reference to a referral for his back. His number is [phone] and patient stated that you can call him later on today

## 2012-11-26 ENCOUNTER — Telehealth: Payer: Self-pay

## 2012-11-26 NOTE — Telephone Encounter (Signed)
I called and left message for pt to call back.  See what his specific concern is.  We have made referral to neurosurgeon and we need to make  Sure he has appt.

## 2012-11-26 NOTE — Telephone Encounter (Signed)
Geremy Rister just wants you to call him back at [phone]

## 2012-11-26 NOTE — Telephone Encounter (Signed)
Already called pt back and answered his questions.

## 2013-02-03 ENCOUNTER — Other Ambulatory Visit: Payer: Self-pay | Admitting: Family Medicine

## 2013-02-24 ENCOUNTER — Ambulatory Visit: Payer: BC Managed Care – PPO | Admitting: Family Medicine

## 2013-02-25 ENCOUNTER — Encounter: Payer: Self-pay | Admitting: Family Medicine

## 2013-02-25 ENCOUNTER — Ambulatory Visit (INDEPENDENT_AMBULATORY_CARE_PROVIDER_SITE_OTHER): Payer: BC Managed Care – PPO | Admitting: Family Medicine

## 2013-02-25 VITALS — BP 130/80 | HR 87 | Temp 97.3°F | Wt 196.0 lb

## 2013-02-25 DIAGNOSIS — G252 Other specified forms of tremor: Secondary | ICD-10-CM

## 2013-02-25 DIAGNOSIS — G250 Essential tremor: Secondary | ICD-10-CM

## 2013-02-25 NOTE — Progress Notes (Signed)
Pre visit review using our clinic review tool, if applicable. No additional management support is needed unless otherwise documented below in the visit note. 

## 2013-02-25 NOTE — Patient Instructions (Signed)
Tremor Tremor is a rhythmic, involuntary muscular contraction characterized by oscillations (to-and-fro movements) of a part of the body. The most common of all involuntary movements, tremor can affect various body parts such as the hands, head, facial structures, vocal cords, trunk, and legs; most tremors, however, occur in the hands. Tremor often accompanies neurological disorders associated with aging. Although the disorder is not life-threatening, it can be responsible for functional disability and social embarrassment. TREATMENT  There are many types of tremor and several ways in which tremor is classified. The most common classification is by behavioral context or position. There are five categories of tremor within this classification: resting, postural, kinetic, task-specific, and psychogenic. Resting or static tremor occurs when the muscle is at rest, for example when the hands are lying on the lap. This type of tremor is often seen in patients with Parkinson's disease. Postural tremor occurs when a patient attempts to maintain posture, such as holding the hands outstretched. Postural tremors include physiological tremor, essential tremor, tremor with basal ganglia disease (also seen in patients with Parkinson's disease), cerebellar postural tremor, tremor with peripheral neuropathy, post-traumatic tremor, and alcoholic tremor. Kinetic or intention (action) tremor occurs during purposeful movement, for example during finger-to-nose testing. Task-specific tremor appears when performing goal-oriented tasks such as handwriting, speaking, or standing. This group consists of primary writing tremor, vocal tremor, and orthostatic tremor. Psychogenic tremor occurs in both older and younger patients. The key feature of this tremor is that it dramatically lessens or disappears when the patient is distracted. PROGNOSIS There are some treatment options available for tremor; the appropriate treatment depends on  accurate diagnosis of the cause. Some tremors respond to treatment of the underlying condition, for example in some cases of psychogenic tremor treating the patient's underlying mental problem may cause the tremor to disappear. Also, patients with tremor due to Parkinson's disease may be treated with Levodopa drug therapy. Symptomatic drug therapy is available for several other tremors as well. For those cases of tremor in which there is no effective drug treatment, physical measures such as teaching the patient to brace the affected limb during the tremor are sometimes useful. Surgical intervention such as thalamotomy or deep brain stimulation may be useful in certain cases. Document Released: 12/21/2001 Document Revised: 03/25/2011 Document Reviewed: 12/31/2004 [facility] Patient Information 2014 [place].  Try to minimize caffeine intake. If tremor becomes more bothersome, we can explore other medication options.

## 2013-02-25 NOTE — Progress Notes (Signed)
   Subjective:    Patient ID: Bryce Wood, male    DOB: October 14, 1945, 68 y.o.   MRN: [id number]  HPI Patient seen with bilateral upper extremity tremor mostly involving the hands. First noted a few months ago. Apparently his mother and maternal grandmother had similar symptoms. He drinks caffeine in the form of about 2 cokes per day. No other medications that would cause tremor. He has not had any recent appetite or weight changes. No tachycardia. No diarrhea. No alcohol use. Denies any head or neck symptoms. No lower extremity tremor. No weakness. Denies any other neurologic symptoms. No clear exacerbating factors. No alleviating factors. Symptoms are relatively mild.  He has noticed that his tremor seems to be worse with fine motor skills such as turning fingernails Previous thyroid testing normal. He does not have any history of weight loss, diarrhea, tachycardia to suggest likely hyperthyroidism.  Past Medical History  Diagnosis Date  . HYPERLIPIDEMIA 11/21/2008  . HYPERTENSION 08/01/2008  . OSTEOARTHRITIS 08/01/2008  . ARTHRITIS 08/01/2008   Past Surgical History  Procedure Laterality Date  . Hernia repair  1979  . Spine surgery  1999    disk    reports that he has never smoked. He does not have any smokeless tobacco history on file. His alcohol and drug histories are not on file. family history includes Alcohol abuse in his other; Arthritis in his other; Cancer (age of onset: 94) in his paternal uncle; Hypertension in his other; Leukemia in his other; Stroke in his other. No Known Allergies    Review of Systems  Constitutional: Negative for appetite change, fatigue and unexpected weight change.  Respiratory: Negative for shortness of breath.   Cardiovascular: Negative for chest pain.  Musculoskeletal: Positive for arthralgias and back pain.  Neurological: Positive for tremors. Negative for syncope, weakness and numbness.  Hematological: Negative for adenopathy.         Objective:   Physical Exam  Constitutional: He is oriented to person, place, and time. He appears well-developed and well-nourished.  Neck: Neck supple. No thyromegaly present.  Cardiovascular: Normal rate and regular rhythm.   Pulmonary/Chest: Effort normal and breath sounds normal. No respiratory distress. He has no wheezes. He has no rales.  Musculoskeletal: He exhibits no edema.  Neurological: He is alert and oriented to person, place, and time. He has normal reflexes. No cranial nerve deficit.  Patient has very subtle tremor upper extremities involving the hands. This does not extinguish with fine motor use.  No focal weakness. No cogwheel rigidity. Gait is normal          Assessment & Plan:  Bilateral hand tremor. Symptoms relatively mild. This is very likely essential tremor with positive family history and history and exam as above.Marland Kitchen He does not have any clinical findings to suggest Parkinson's disease. Minimize caffeine use. We discussed possible medical treatment options including beta blockers and Mysoline at this point symptoms are mild and he wishes to observe

## 2013-03-19 ENCOUNTER — Other Ambulatory Visit (INDEPENDENT_AMBULATORY_CARE_PROVIDER_SITE_OTHER): Payer: BC Managed Care – PPO

## 2013-03-19 DIAGNOSIS — Z0000 Encounter for general adult medical examination without abnormal findings: Secondary | ICD-10-CM

## 2013-03-19 DIAGNOSIS — E785 Hyperlipidemia, unspecified: Secondary | ICD-10-CM

## 2013-03-19 DIAGNOSIS — I10 Essential (primary) hypertension: Secondary | ICD-10-CM

## 2013-03-19 DIAGNOSIS — R351 Nocturia: Secondary | ICD-10-CM

## 2013-03-19 LAB — CBC WITH DIFFERENTIAL/PLATELET
Basophils Absolute: 0 10*3/uL (ref 0.0–0.1)
Basophils Relative: 0.6 % (ref 0.0–3.0)
EOS ABS: 0.1 10*3/uL (ref 0.0–0.7)
Eosinophils Relative: 1 % (ref 0.0–5.0)
HEMATOCRIT: 41.5 % (ref 39.0–52.0)
Hemoglobin: 13.7 g/dL (ref 13.0–17.0)
Lymphocytes Relative: 24.7 % (ref 12.0–46.0)
Lymphs Abs: 1.4 10*3/uL (ref 0.7–4.0)
MCHC: 32.9 g/dL (ref 30.0–36.0)
MCV: 94.6 fl (ref 78.0–100.0)
MONO ABS: 0.6 10*3/uL (ref 0.1–1.0)
Monocytes Relative: 10.6 % (ref 3.0–12.0)
NEUTROS PCT: 63.1 % (ref 43.0–77.0)
Neutro Abs: 3.5 10*3/uL (ref 1.4–7.7)
PLATELETS: 269 10*3/uL (ref 150.0–400.0)
RBC: 4.39 Mil/uL (ref 4.22–5.81)
RDW: 13.9 % (ref 11.5–14.6)
WBC: 5.5 10*3/uL (ref 4.5–10.5)

## 2013-03-19 LAB — HEPATIC FUNCTION PANEL
ALT: 18 U/L (ref 0–53)
AST: 19 U/L (ref 0–37)
Albumin: 4 g/dL (ref 3.5–5.2)
Alkaline Phosphatase: 54 U/L (ref 39–117)
BILIRUBIN TOTAL: 0.8 mg/dL (ref 0.3–1.2)
Bilirubin, Direct: 0.1 mg/dL (ref 0.0–0.3)
TOTAL PROTEIN: 6.6 g/dL (ref 6.0–8.3)

## 2013-03-19 LAB — POCT URINALYSIS DIPSTICK
BILIRUBIN UA: NEGATIVE
Glucose, UA: NEGATIVE
Ketones, UA: NEGATIVE
LEUKOCYTES UA: NEGATIVE
Nitrite, UA: NEGATIVE
PH UA: 6
PROTEIN UA: NEGATIVE
RBC UA: NEGATIVE
Spec Grav, UA: 1.025
Urobilinogen, UA: 0.2

## 2013-03-19 LAB — BASIC METABOLIC PANEL
BUN: 18 mg/dL (ref 6–23)
CO2: 27 meq/L (ref 19–32)
CREATININE: 0.9 mg/dL (ref 0.4–1.5)
Calcium: 9.7 mg/dL (ref 8.4–10.5)
Chloride: 107 mEq/L (ref 96–112)
GFR: 87.4 mL/min (ref >60.00)
GLUCOSE: 88 mg/dL (ref 70–99)
Potassium: 4.9 mEq/L (ref 3.5–5.1)
SODIUM: 141 meq/L (ref 135–145)

## 2013-03-19 LAB — LIPID PANEL
CHOL/HDL RATIO: 4
Cholesterol: 199 mg/dL (ref 0–200)
HDL: 55.5 mg/dL (ref >39.00)
LDL Cholesterol: 122 mg/dL — ABNORMAL HIGH (ref 0–99)
TRIGLYCERIDES: 109 mg/dL (ref 0.0–149.0)
VLDL: 21.8 mg/dL (ref 0.0–40.0)

## 2013-03-19 LAB — PSA: PSA: 0.55 ng/mL (ref 0.10–4.00)

## 2013-03-19 LAB — TSH: TSH: 1.36 u[IU]/mL (ref 0.35–5.50)

## 2013-04-05 ENCOUNTER — Encounter: Payer: Self-pay | Admitting: Family Medicine

## 2013-04-05 ENCOUNTER — Ambulatory Visit (INDEPENDENT_AMBULATORY_CARE_PROVIDER_SITE_OTHER): Payer: BC Managed Care – PPO | Admitting: Family Medicine

## 2013-04-05 VITALS — BP 130/74 | HR 76 | Temp 97.7°F | Ht 72.0 in | Wt 195.0 lb

## 2013-04-05 DIAGNOSIS — Z0000 Encounter for general adult medical examination without abnormal findings: Secondary | ICD-10-CM

## 2013-04-05 NOTE — Progress Notes (Signed)
Pre visit review using our clinic review tool, if applicable. No additional management support is needed unless otherwise documented below in the visit note. 

## 2013-04-05 NOTE — Progress Notes (Signed)
Subjective:    Patient ID: Marguerite Jarboe, male    DOB: 1945/06/21, 68 y.o.   MRN: [id number]  HPI Patient here for complete physical. He has history of hypertension treated with lisinopril. He's had some issues with osteoarthritis and chronic left foot pain with hypertrophic bony changes related to that. Works on hard floors most of the day. He takes meloxicam 15 mg once daily which helps. He's had some recent lumbar radiculopathy symptoms and is seen orthopedic specialists and had recent epidurals. His back pain is slowly improving.  He has never had colonoscopy and has refused these. His immunizations otherwise are up to date with exception of no shingles vaccine he does not wish to pursue that either.  Past Medical History  Diagnosis Date  . HYPERLIPIDEMIA 11/21/2008  . HYPERTENSION 08/01/2008  . OSTEOARTHRITIS 08/01/2008  . ARTHRITIS 08/01/2008   Past Surgical History  Procedure Laterality Date  . Hernia repair  1979  . Spine surgery  1999    disk    reports that he has never smoked. He does not have any smokeless tobacco history on file. His alcohol and drug histories are not on file. family history includes Alcohol abuse in his other; Arthritis in his other; Cancer (age of onset: 39) in his paternal uncle; Hypertension in his other; Leukemia in his other; Stroke in his other. No Known Allergies    Review of Systems  Constitutional: Negative for fever, activity change, appetite change and fatigue.  HENT: Negative for congestion, ear pain and trouble swallowing.   Eyes: Negative for pain and visual disturbance.  Respiratory: Negative for cough, shortness of breath and wheezing.   Cardiovascular: Negative for chest pain and palpitations.  Gastrointestinal: Negative for nausea, vomiting, abdominal pain, diarrhea, constipation, blood in stool, abdominal distention and rectal pain.  Endocrine: Negative for polydipsia and polyuria.  Genitourinary: Negative for dysuria, hematuria and  testicular pain.  Musculoskeletal: Positive for arthralgias and back pain. Negative for joint swelling.  Skin: Negative for rash.  Neurological: Negative for dizziness, syncope and headaches.  Hematological: Negative for adenopathy.  Psychiatric/Behavioral: Negative for confusion and dysphoric mood.       Objective:   Physical Exam  Constitutional: He is oriented to person, place, and time. He appears well-developed and well-nourished. No distress.  HENT:  Head: Normocephalic and atraumatic.  Right Ear: External ear normal.  Left Ear: External ear normal.  Mouth/Throat: Oropharynx is clear and moist.  Eyes: Conjunctivae and EOM are normal. Pupils are equal, round, and reactive to light.  Neck: Normal range of motion. Neck supple. No thyromegaly present.  Cardiovascular: Normal rate, regular rhythm and normal heart sounds.   No murmur heard. Pulmonary/Chest: No respiratory distress. He has no wheezes. He has no rales.  Abdominal: Soft. Bowel sounds are normal. He exhibits no distension and no mass. There is no tenderness. There is no rebound and no guarding.  Genitourinary: Rectum normal and prostate normal.  Musculoskeletal: He exhibits no edema.  Patient has some hypertrophic bony changes on the medial aspect of left foot which been chronic for 20 years  Lymphadenopathy:    He has no cervical adenopathy.  Neurological: He is alert and oriented to person, place, and time. He displays normal reflexes. No cranial nerve deficit.  Skin: No rash noted.  Psychiatric: He has a normal mood and affect.          Assessment & Plan:  Complete physical. Labs reviewed. No major concerns. Offered colonoscopy and patient declines. Offered shingles  vaccine and patient declines. Recommend yearly flu vaccine.

## 2013-06-03 ENCOUNTER — Other Ambulatory Visit: Payer: Self-pay | Admitting: Family Medicine

## 2013-09-15 ENCOUNTER — Ambulatory Visit (INDEPENDENT_AMBULATORY_CARE_PROVIDER_SITE_OTHER): Payer: BC Managed Care – PPO | Admitting: Family Medicine

## 2013-09-15 ENCOUNTER — Encounter: Payer: Self-pay | Admitting: Family Medicine

## 2013-09-15 VITALS — BP 132/80 | HR 70 | Temp 98.0°F | Wt 194.0 lb

## 2013-09-15 DIAGNOSIS — M25569 Pain in unspecified knee: Secondary | ICD-10-CM

## 2013-09-15 DIAGNOSIS — M25562 Pain in left knee: Secondary | ICD-10-CM

## 2013-09-15 NOTE — Patient Instructions (Signed)
Continue with Meloxicam Follow up for any persistent knee pain or new symptoms such as swelling

## 2013-09-15 NOTE — Progress Notes (Signed)
   Subjective:    Patient ID: Bryce Wood, male    DOB: 1945-05-07, 68 y.o.   MRN: [id number]  Leg Pain    Acute visit for left knee pain. Onset was yesterday. He was noted in of his walk and had sudden pain along the posterior aspect of the left knee. Get some limp afterwards. His pain is improved but not resolved today. No swelling. No locking or giving way. No injury reported. He takes meloxicam daily for osteoarthritis pains. No alleviating factors  Past Medical History  Diagnosis Date  . HYPERLIPIDEMIA 11/21/2008  . HYPERTENSION 08/01/2008  . OSTEOARTHRITIS 08/01/2008  . ARTHRITIS 08/01/2008   Past Surgical History  Procedure Laterality Date  . Hernia repair  1979  . Spine surgery  1999    disk    reports that he has never smoked. He does not have any smokeless tobacco history on file. His alcohol and drug histories are not on file. family history includes Alcohol abuse in his other; Arthritis in his other; Cancer (age of onset: 42) in his paternal uncle; Hypertension in his other; Leukemia in his other; Stroke in his other. No Known Allergies    Review of Systems  Respiratory: Negative for shortness of breath.   Cardiovascular: Negative for leg swelling.  Neurological: Negative for weakness.       Objective:   Physical Exam  Constitutional: He appears well-developed and well-nourished.  Cardiovascular: Normal rate and regular rhythm.   Pulmonary/Chest: Effort normal and breath sounds normal. No respiratory distress. He has no wheezes. He has no rales.  Musculoskeletal:  Left knee reveals full range of motion. No erythema. No ecchymosis. No effusion. No localized tenderness. No hamstring or calf tenderness. No pain with dorsiflexion or plantarflexion. He denies any pain with knee extension or flexion against resistance. Ligament testing is normal. He has very prominent varicose veins left lower extremity which are chronic.          Assessment & Plan:  Sudden acute  left posterior knee pain yesterday while walking. Question pinched synovium versus posterior meniscus. He does not have any effusion and is much better today. We've recommended continued meloxicam and observation. Touch base in one to 2 weeks if not continuing to improve.

## 2013-09-15 NOTE — Progress Notes (Signed)
Pre visit review using our clinic review tool, if applicable. No additional management support is needed unless otherwise documented below in the visit note. 

## 2013-09-24 ENCOUNTER — Ambulatory Visit (INDEPENDENT_AMBULATORY_CARE_PROVIDER_SITE_OTHER): Payer: BC Managed Care – PPO | Admitting: Family Medicine

## 2013-09-24 ENCOUNTER — Ambulatory Visit (INDEPENDENT_AMBULATORY_CARE_PROVIDER_SITE_OTHER)
Admission: RE | Admit: 2013-09-24 | Discharge: 2013-09-24 | Disposition: A | Discharge status: Home / Self Care | Payer: BC Managed Care – PPO | Source: Ambulatory Visit | Attending: Family Medicine | Admitting: Family Medicine

## 2013-09-24 ENCOUNTER — Encounter: Payer: Self-pay | Admitting: Family Medicine

## 2013-09-24 VITALS — BP 130/82 | HR 64 | Temp 98.1°F | Wt 194.0 lb

## 2013-09-24 DIAGNOSIS — M25561 Pain in right knee: Secondary | ICD-10-CM

## 2013-09-24 DIAGNOSIS — M25562 Pain in left knee: Principal | ICD-10-CM

## 2013-09-24 DIAGNOSIS — M25569 Pain in unspecified knee: Secondary | ICD-10-CM

## 2013-09-24 NOTE — Progress Notes (Signed)
Pre visit review using our clinic review tool, if applicable. No additional management support is needed unless otherwise documented below in the visit note. 

## 2013-09-24 NOTE — Progress Notes (Signed)
   Subjective:    Patient ID: Bryce Wood, male    DOB: [birth year], 68 y.o.   MRN: [id number]  Knee Pain   Shoulder Pain  Pertinent negatives include no fever.   Progressive bilateral knee pains. He was seen recently with acute left knee pain.   That episode of pain was more posterior and is actually resolved. He's been working long hours on his feet lately. Progressive stiffness and bilateral knee pains mostly medial. No effusion. No erythema. No warmth. No specific injury. He takes meloxicam daily. He's not had any recent x-rays  Past Medical History  Diagnosis Date  . HYPERLIPIDEMIA 11/21/2008  . HYPERTENSION 08/01/2008  . OSTEOARTHRITIS 08/01/2008  . ARTHRITIS 08/01/2008   Past Surgical History  Procedure Laterality Date  . Hernia repair  1979  . Spine surgery  1999    disk    reports that he has never smoked. He does not have any smokeless tobacco history on file. His alcohol and drug histories are not on file. family history includes Alcohol abuse in his other; Arthritis in his other; Cancer (age of onset: 38) in his paternal uncle; Hypertension in his other; Leukemia in his other; Stroke in his other. No Known Allergies      Review of Systems  Constitutional: Negative for fever, chills, appetite change and unexpected weight change.  Respiratory: Negative for cough and shortness of breath.   Skin: Negative for rash.       Objective:   Physical Exam  Constitutional: He appears well-developed and well-nourished.  Cardiovascular: Normal rate and regular rhythm.   Musculoskeletal: He exhibits no edema.  He has good range of motion both knees. No erythema. No warmth. No ecchymosis. No specific point tenderness. No effusion          Assessment & Plan:  Progressive bilateral knee pain. ? degenerative osteoarthritis. He does not have evidence clinically to suggest acute inflammatory arthritis.  Obtain x-rays right and left knee. Consider orthopedic referral

## 2013-10-06 ENCOUNTER — Ambulatory Visit: Payer: BC Managed Care – PPO | Admitting: Family Medicine

## 2013-10-11 ENCOUNTER — Ambulatory Visit (INDEPENDENT_AMBULATORY_CARE_PROVIDER_SITE_OTHER): Payer: BC Managed Care – PPO | Admitting: Family Medicine

## 2013-10-11 ENCOUNTER — Encounter: Payer: Self-pay | Admitting: Family Medicine

## 2013-10-11 ENCOUNTER — Other Ambulatory Visit (INDEPENDENT_AMBULATORY_CARE_PROVIDER_SITE_OTHER): Payer: BC Managed Care – PPO

## 2013-10-11 VITALS — BP 126/82 | HR 71 | Ht 74.0 in | Wt 195.0 lb

## 2013-10-11 DIAGNOSIS — M25561 Pain in right knee: Secondary | ICD-10-CM

## 2013-10-11 DIAGNOSIS — M25562 Pain in left knee: Principal | ICD-10-CM

## 2013-10-11 DIAGNOSIS — M23201 Derangement of unspecified lateral meniscus due to old tear or injury, left knee: Secondary | ICD-10-CM

## 2013-10-11 DIAGNOSIS — M23302 Other meniscus derangements, unspecified lateral meniscus, unspecified knee: Secondary | ICD-10-CM

## 2013-10-11 DIAGNOSIS — M23209 Derangement of unspecified meniscus due to old tear or injury, unspecified knee: Secondary | ICD-10-CM | POA: Insufficient documentation

## 2013-10-11 DIAGNOSIS — M23207 Derangement of unspecified meniscus due to old tear or injury, left knee: Secondary | ICD-10-CM

## 2013-10-11 DIAGNOSIS — M25569 Pain in unspecified knee: Secondary | ICD-10-CM

## 2013-10-11 DIAGNOSIS — M23204 Derangement of unspecified medial meniscus due to old tear or injury, left knee: Secondary | ICD-10-CM

## 2013-10-11 DIAGNOSIS — M1710 Unilateral primary osteoarthritis, unspecified knee: Secondary | ICD-10-CM

## 2013-10-11 NOTE — Patient Instructions (Signed)
Good to meet you Ice 20 minutes 2 times daily. Usually after activity and before bed. Exercises 3 times a week.  Walk 3 times a week Consider cross training with biking or swimming.  Bronwen Betters, Dansko, or new balance greater  Take tylenol 650 mg three times a day is the best evidence based medicine we have for arthritis.  Glucosamine sulfate 750mg  twice a day is a supplement that has been shown to help moderate to severe arthritis. Vitamin D 2000 IU daily Fish oil 2 grams daily.  Tumeric 500mg  twice daily.  Capsaicin topically up to four times a day may also help with pain. Cortisone injections are an option if these interventions do not seem to make a difference or need more relief.  If cortisone injections do not help, there are different types of shots that may help but they take longer to take effect.  We can discuss this at follow up.  It's important that you continue to stay active. Controlling your weight is important.  Consider physical therapy to strengthen muscles around the joint that hurts to take pressure off of the joint itself. Water aerobics and cycling with low resistance are the best two types of exercise for arthritis. Come back and see me in 3 weeks.

## 2013-10-11 NOTE — Progress Notes (Signed)
[name] [address] [place], [place] [address] Phone: [phone] Subjective:    I'm seeing this patient by the request  of:  Eulas Post, MD   CC: Bilateral knee pain SEG:[id number] Bryce Wood is a 68 y.o. male coming in with complaint of bilateral knee pain. Patient states he has had this pain intermittently for quite some time it seems to be worsening over the course last month. Patient does remember one episode when he is walking he had severe pain in the posterior aspect of his left knee. Patient states the pain is worse in his left knee. Patient states that it is not stopping him from any activities but does notice some discomfort after a long day. Denies any numbness or tingling. Denies any radiation down the leg. That he notices. Patient does have a past medical history significant for varicose veins did seem to be worse after the injury but denies any redness of the skin or any difficulty with breathing or any history of clotting disorders. Patient describes the pain as more of a dull aching pain it does not stop him from any activities of daily living it may be improving with him taken meloxicam. Patient does have a past medical history also significant for degenerative disc disease with herniated disc status post microdiscectomy but states that this is a different pain as well. Patient does state that from time to time when he does a twisting sensation he can have severe pain. Patient rates the severity of 5/10 in severity. No nighttime awakening.  X-rays were ordered on September 24, 2013 for review by me today. X-rays show that patient does have mild osteoarthritic changes bilaterally mostly over the medial and patellofemoral compartments.     Past medical history, social, surgical and family history all reviewed in electronic medical record.   Review of Systems: No headache, visual changes, nausea, vomiting, diarrhea, constipation,  dizziness, abdominal pain, skin rash, fevers, chills, night sweats, weight loss, swollen lymph nodes, body aches, joint swelling, muscle aches, chest pain, shortness of breath, mood changes.   Objective Blood pressure 126/82, pulse 71, height 6\' 2"  (1.88 m), weight 195 lb (88.451 kg), SpO2 90.00%.  General: No apparent distress alert and oriented x3 mood and affect normal, dressed appropriately.  HEENT: Pupils equal, extraocular movements intact  Respiratory: Patient's speak in full sentences and does not appear short of breath  Cardiovascular: No lower extremity edema, non tender, no erythema patient does have severe varicose veins of the legs bilaterally left greater than right Skin: Warm dry intact with no signs of infection or rash on extremities or on axial skeleton.  Abdomen: Soft nontender  Neuro: Cranial nerves II through XII are intact, neurovascularly intact in all extremities with 2+ DTRs and 2+ pulses.  Lymph: No lymphadenopathy of posterior or anterior cervical chain or axillae bilaterally.  Gait normal with good balance and coordination.  MSK:  Non tender with full range of motion and good stability and symmetric strength and tone of shoulders, elbows, wrist, hip, and ankles bilaterally.  Knee: Bilaterally Inspection shows the patient does have severe varicose veins even proximal to the knees bilaterally. The patient is tenderness to the knees mostly of the medial aspect bilaterally her ROM full in flexion and extension and lower leg rotation. Ligaments with solid consistent endpoints including ACL, PCL, LCL, MCL. Positive Mcmurray's, Apley's, and Thessalonian tests on left side negative on right side. Non painful patellar compression. Patellar glide without crepitus. Patellar and  quadriceps tendons unremarkable. Hamstring and quadriceps strength is normal.    MSK US performed of: Left knee This study was ordered, performed, and interpreted by Charlann Boxer D.O.  Knee: All  structures visualized.  posterior medial meniscal does have a very large acute on chronic tear with mild hypoechoic changes and moderately displaced. Patient is tenderness area. Mild medial joint line narrowing noted. Anteromedial, anterolateral, and posterolateral menisci unremarkable without tearing, fraying, effusion, or displacement. Patellar Tendon unremarkable on long and transverse views without effusion. No abnormality of prepatellar bursa. LCL and MCL unremarkable on long and transverse views. No abnormality of origin of medial or lateral head of the gastrocnemius.  IMPRESSION:  Acute on chronic meniscal tear with moderate narrowing of the joint space.     Impression and Recommendations:     This case required medical decision making of moderate complexity.

## 2013-10-11 NOTE — Assessment & Plan Note (Signed)
Patient is going to try some over-the-counter medications overall. Patient does have significant osteophytic changes at multiple different joints I do think vitamin D could be playing a role. We discussed stain active in other exercises that could be pulled beneficial to his joints and walking. We discussed icing protocol and patient will come back again in 3-4 weeks for further evaluation.

## 2013-10-11 NOTE — Assessment & Plan Note (Signed)
Discussed with patient at great length. Patient does have what appears to be an acute on chronic meniscal tear. Patient was given home exercise program, topical anti-inflammatories, as well as over-the-counter medications I think will be beneficial. Patient will try to slowly decrease his meloxicam over the course of the next several weeks. Patient was given an icing protocol as well but I think would be helpful. Patient declined corticosteroid, formal physical therapy, or bracing today. We will see patient again in 3-4 weeks and is continuing to have any difficulties we'll consider some of these other alternative treatment options.

## 2013-11-02 ENCOUNTER — Ambulatory Visit (INDEPENDENT_AMBULATORY_CARE_PROVIDER_SITE_OTHER): Payer: BC Managed Care – PPO | Admitting: Family Medicine

## 2013-11-02 ENCOUNTER — Encounter: Payer: Self-pay | Admitting: Family Medicine

## 2013-11-02 VITALS — BP 140/72 | HR 80 | Ht 74.0 in | Wt 194.0 lb

## 2013-11-02 DIAGNOSIS — M23207 Derangement of unspecified meniscus due to old tear or injury, left knee: Secondary | ICD-10-CM

## 2013-11-02 DIAGNOSIS — M23204 Derangement of unspecified medial meniscus due to old tear or injury, left knee: Secondary | ICD-10-CM

## 2013-11-02 DIAGNOSIS — M23201 Derangement of unspecified lateral meniscus due to old tear or injury, left knee: Secondary | ICD-10-CM

## 2013-11-02 NOTE — Progress Notes (Signed)
  [name] [address], [place] [address] Phone: [phone] Subjective:     CC: Bilateral knee pain followup [id number] Bryce Wood is a 68 y.o. male coming in with complaint of bilateral knee pain. She was having significant more pain on his left knee previously. Patient did have a chronic meniscal tear and decided on conservative therapy. Patient has been doing icing, home exercises, as well as over-the-counter supplementation. Patient states that he is approximately 90% better. Patient has been able to play golf without any significant discomfort. Patient denies any clicking, popping or giving on him. Patient denies any nighttime awakening secondary to pain. Patient is no longer even taking any pain medications for that problem. Patient would like to get off the Tylenol completely and discussed even stopping the other supplementations. Patient denies any new symptoms.     Past medical history, social, surgical and family history all reviewed in electronic medical record.   Review of Systems: No headache, visual changes, nausea, vomiting, diarrhea, constipation, dizziness, abdominal pain, skin rash, fevers, chills, night sweats, weight loss, swollen lymph nodes, body aches, joint swelling, muscle aches, chest pain, shortness of breath, mood changes.   Objective Blood pressure 140/72, pulse 80, height 6\' 2"  (1.88 m), weight 194 lb (87.998 kg), SpO2 97.00%.  General: No apparent distress alert and oriented x3 mood and affect normal, dressed appropriately.  HEENT: Pupils equal, extraocular movements intact  Respiratory: Patient's speak in full sentences and does not appear short of breath  Cardiovascular: No lower extremity edema, non tender, no erythema patient does have severe varicose veins of the legs bilaterally left greater than right Skin: Warm dry intact with no signs of infection or rash on extremities or on axial skeleton.  Abdomen: Soft  nontender  Neuro: Cranial nerves II through XII are intact, neurovascularly intact in all extremities with 2+ DTRs and 2+ pulses.  Lymph: No lymphadenopathy of posterior or anterior cervical chain or axillae bilaterally.  Gait normal with good balance and coordination.  MSK:  Non tender with full range of motion and good stability and symmetric strength and tone of shoulders, elbows, wrist, hip, and ankles bilaterally.  Knee: Bilaterally Inspection shows the patient does have severe varicose veins even proximal to the knees bilaterally. Improved tenderness ROM full in flexion and extension and lower leg rotation. Ligaments with solid consistent endpoints including ACL, PCL, LCL, MCL. Negative Mcmurray's, Apley's, and Thessalonian tests  Non painful patellar compression. Patellar glide without crepitus. Patellar and quadriceps tendons unremarkable. Hamstring and quadriceps strength is normal.      Impression and Recommendations:     This case required medical decision making of moderate complexity.

## 2013-11-02 NOTE — Assessment & Plan Note (Signed)
Patient is doing remarkably well with conservative therapy. We discussed an icing regimen and continuing home exercises 3 times a week for another 6 weeks. We discussed proper shoe wear and avoid any significant twisting or deep flexion for another 2-3 weeks. I saw the patient as well he can follow up on an as-needed basis. Patient was given a tapered to get off Tylenol.

## 2013-11-02 NOTE — Patient Instructions (Signed)
Good to see you Ice is still your friend after activity.  Tylenol we will drop to 325mg  three times daily for 1 week and then 2 times daily for 1 week then daily for 1 week and then discontinue.  If pain worsens go baack to previous week.  Continue the vitamin D and the turmeric Discontinue the meloxicam and then only use as needed.  Continue the exercisers  See me again when you need me.

## 2014-01-04 ENCOUNTER — Encounter: Payer: Self-pay | Admitting: Family Medicine

## 2014-01-04 ENCOUNTER — Ambulatory Visit (INDEPENDENT_AMBULATORY_CARE_PROVIDER_SITE_OTHER): Payer: BC Managed Care – PPO | Admitting: Family Medicine

## 2014-01-04 VITALS — BP 110/70 | HR 73 | Ht 74.0 in | Wt 196.0 lb

## 2014-01-04 DIAGNOSIS — M2142 Flat foot [pes planus] (acquired), left foot: Secondary | ICD-10-CM

## 2014-01-04 DIAGNOSIS — M23204 Derangement of unspecified medial meniscus due to old tear or injury, left knee: Secondary | ICD-10-CM

## 2014-01-04 DIAGNOSIS — M23207 Derangement of unspecified meniscus due to old tear or injury, left knee: Secondary | ICD-10-CM

## 2014-01-04 DIAGNOSIS — M23201 Derangement of unspecified lateral meniscus due to old tear or injury, left knee: Secondary | ICD-10-CM

## 2014-01-04 DIAGNOSIS — M2141 Flat foot [pes planus] (acquired), right foot: Secondary | ICD-10-CM

## 2014-01-04 NOTE — Assessment & Plan Note (Signed)
Patient does have severe osteo-arthritis of the midfoot. Patient also has significant changes of the navicular bone that likely is contribute to possibly early avascular necrosis that occurred. Patient has seen other providers for this previously indeed discussed surgical intervention which patient wanted to avoid. We discussed different treatment options. Patient will try conservative therapy with icing, we discussed shoes, the patient does need wide shoes. We discussed avoiding being barefoot. We discussed over-the-counter medications he can help with the daily pain. Patient continues to have difficulty we can consider custom orthotics. We will discuss that further evaluation.  Spent greater than 25 minutes with patient face-to-face and had greater than 50% of counseling including as described above in assessment and plan.

## 2014-01-04 NOTE — Patient Instructions (Addendum)
Good to see you Ice bath to the foot for 10 minutes at night Continue the medicines as we discussed.  12 on Thursday to make orthotics.  For knee change seat bringing it down and raise handlebars.  Increase resistance 1-2 levels.  consider some weight lifting 2 times a week.  Try the pennsaid up to 2 times a day for any pain.  Happy holidays!

## 2014-01-04 NOTE — Assessment & Plan Note (Signed)
I believe the patient's pain in the knees is likely still to chronic meniscal tear but patient is compensating for the foot pain. Encourage him to continue the home exercises more on a regular basis as well as the icing protocol. Patient has any worsening symptoms I would consider an injection in the knee again. We discussed possible formal physical therapy which patient declined. Patient will come back and see me again if any exacerbation occurs.

## 2014-01-04 NOTE — Progress Notes (Signed)
[name] [address] [address], [place] [address] Phone: [phone] Subjective:    CC: Bilateral knee pain follow up  [id number] Bryce Wood is a 68 y.o. male coming in with complaint of bilateral knee pain. She was having significant more pain on his left knee previously. Patient did have a chronic meniscal tear and decided on conservative therapy. Patient was doing significantly better at first and then unfortunately started having worsening discomfort again. Patient states though it still not stopping him from activities of daily living. Just more of a sore sensation. Denies any clicking, popping or locking that causes him pain. Has not given out on him. Denies nighttime awakening. More of a dull throbbing aching sensation. X-rays only showed mild osteophytic changes of the knee.  Patient though is unfortunately having more of a left foot pain. Patient states that the pain is mostly over the medial aspect of the midfoot. Patient has seen other providers for this and told that has been arthritis. Patient has had custom orthotics previously with no significant improvement. Patient states that it is more of a dull throbbing pain that is worse when he is on his feet for long amount of time. Patient states there is a strong family history of this problem. Denies any numbness in the toes. Denies any swelling. Unable to walk barefoot without extreme pain. Puts the severity of 7 out of 10.     Past medical history, social, surgical and family history all reviewed in electronic medical record.   Review of Systems: No headache, visual changes, nausea, vomiting, diarrhea, constipation, dizziness, abdominal pain, skin rash, fevers, chills, night sweats, weight loss, swollen lymph nodes, body aches, joint swelling, muscle aches, chest pain, shortness of breath, mood changes.   Objective Blood pressure 110/70, pulse 73, height 6\' 2"  (1.88 m), weight 196 lb (88.905 kg),  SpO2 97 %.  General: No apparent distress alert and oriented x3 mood and affect normal, dressed appropriately.  HEENT: Pupils equal, extraocular movements intact  Respiratory: Patient's speak in full sentences and does not appear short of breath  Cardiovascular: No lower extremity edema, non tender, no erythema patient does have severe varicose veins of the legs bilaterally left greater than right Skin: Warm dry intact with no signs of infection or rash on extremities or on axial skeleton.  Abdomen: Soft nontender  Neuro: Cranial nerves II through XII are intact, neurovascularly intact in all extremities with 2+ DTRs and 2+ pulses.  Lymph: No lymphadenopathy of posterior or anterior cervical chain or axillae bilaterally.  Gait normal with good balance and coordination.  MSK:  Non tender with full range of motion and good stability and symmetric strength and tone of shoulders, elbows, wrist, hip, and ankles bilaterally.  Knee: Bilaterally Inspection shows the patient does have severe varicose veins even proximal to the knees bilaterally. No change from previous exam Continued tenderness mostly over the medial joint line bilaterally left greater than right ROM full in flexion and extension and lower leg rotation. Ligaments with solid consistent endpoints including ACL, PCL, LCL, MCL. Negative Mcmurray's, Apley's, and Thessalonian tests  Non painful patellar compression. Patellar glide without crepitus. Patellar and quadriceps tendons unremarkable. Hamstring and quadriceps strength is normal.  Foot exam shows the patient does have severe pes planus bilaterally. Patient also has severe osteophytic changes of the navicular bone as well as of the midfoot. No movement whatsoever in the midfoot. Neurovascularly intact distally.    Impression and Recommendations:  This case required medical decision making of moderate complexity.

## 2014-01-06 ENCOUNTER — Ambulatory Visit (INDEPENDENT_AMBULATORY_CARE_PROVIDER_SITE_OTHER): Payer: BC Managed Care – PPO | Admitting: Family Medicine

## 2014-01-06 ENCOUNTER — Encounter: Payer: Self-pay | Admitting: Family Medicine

## 2014-01-06 VITALS — BP 132/80 | HR 75 | Ht 74.0 in | Wt 195.0 lb

## 2014-01-06 DIAGNOSIS — M2141 Flat foot [pes planus] (acquired), right foot: Secondary | ICD-10-CM

## 2014-01-06 DIAGNOSIS — M2142 Flat foot [pes planus] (acquired), left foot: Secondary | ICD-10-CM

## 2014-01-06 NOTE — Assessment & Plan Note (Signed)
Severe changes mostly over the left foot. Patient was given custom orthotics today. Patient is going to increase wearing them slowly over the course of time. We discussed icing as well as with proper shoes patient should have. Patient will make these changes and come back and see me again in 3-4 weeks for further evaluation.

## 2014-01-06 NOTE — Progress Notes (Signed)
  [name] [address] [place], [place] [address] Phone: [phone] Subjective:    [id number] foot pain  [id number] Kylie Gros is a 68 y.o. male coming in with complaint of worsening foot pain. Patient was seen previously and did have severe pes planus of the foot as well as severe osteophytic changes of the midfoot. I do believe the patient had an avascular necrosis of the navicular bone and has significant difficulty since then. Patient was seen previously and attempted conservative therapy. Patient has not made any significant improvement. Continues to have significant pain that is now affecting his daily activities. Please see previous note for more information.     Past medical history, social, surgical and family history all reviewed in electronic medical record.   Review of Systems: No headache, visual changes, nausea, vomiting, diarrhea, constipation, dizziness, abdominal pain, skin rash, fevers, chills, night sweats, weight loss, swollen lymph nodes, body aches, joint swelling, muscle aches, chest pain, shortness of breath, mood changes.   Objective Blood pressure 132/80, pulse 75, height 6\' 2"  (1.88 m), weight 195 lb (88.451 kg), SpO2 97 %.  General: No apparent distress alert and oriented x3 mood and affect normal, dressed appropriately.  HEENT: Pupils equal, extraocular movements intact  Respiratory: Patient's speak in full sentences and does not appear short of breath  Cardiovascular: No lower extremity edema, non tender, no erythema patient does have severe varicose veins of the legs bilaterally left greater than right Skin: Warm dry intact with no signs of infection or rash on extremities or on axial skeleton.  Abdomen: Soft nontender  Neuro: Cranial nerves II through XII are intact, neurovascularly intact in all extremities with 2+ DTRs and 2+ pulses.  Lymph: No lymphadenopathy of posterior or anterior cervical chain or axillae  bilaterally.  Gait normal with good balance and coordination.  MSK:  Non tender with full range of motion and good stability and symmetric strength and tone of shoulders, elbows, wrist, hip, and ankles bilaterally.   Foot exam shows the patient does have severe pes planus bilaterally. Patient also has severe osteophytic changes of the navicular bone as well as of the midfoot.patient is tender to palpation over this area. No movement whatsoever in the midfoot. Neurovascularly intact distally.   Patient was fitted for a : standard, cushioned, semi-rigid orthotic. The orthotic was heated and afterward the patient stood on the orthotic blank positioned on the orthotic stand. The patient was positioned in subtalar neutral position and 10 degrees of ankle dorsiflexion in a weight bearing stance. After completion of molding, a stable base was applied to the orthotic blank. The blank was ground to a stable position for weight bearing. Size: 11 Base: Blue EVA Additional Posting and Padding: None The patient ambulated these, and they were very comfortable.  I spent 45 minutes with this patient, greater than 50% was face-to-face time counseling regarding the below diagnosis.     Impression and Recommendations:

## 2014-01-06 NOTE — Patient Instructions (Signed)
Good to see you The sports Gene Wear for 2 hours today and increase 1 hour daily until 8 hours then can wear full day In next 2 weeks if something not right drop them off and I will make changes .Happy New year!

## 2014-01-22 ENCOUNTER — Other Ambulatory Visit: Payer: Self-pay | Admitting: Family Medicine

## 2014-04-27 ENCOUNTER — Encounter: Payer: Self-pay | Admitting: Family Medicine

## 2014-06-21 ENCOUNTER — Other Ambulatory Visit (INDEPENDENT_AMBULATORY_CARE_PROVIDER_SITE_OTHER): Payer: Self-pay

## 2014-06-21 DIAGNOSIS — Z0000 Encounter for general adult medical examination without abnormal findings: Secondary | ICD-10-CM

## 2014-06-21 LAB — CBC WITH DIFFERENTIAL/PLATELET
BASOS PCT: 0.6 % (ref 0.0–3.0)
Basophils Absolute: 0 10*3/uL (ref 0.0–0.1)
EOS ABS: 0.1 10*3/uL (ref 0.0–0.7)
EOS PCT: 0.8 % (ref 0.0–5.0)
HEMATOCRIT: 42 % (ref 39.0–52.0)
Hemoglobin: 14 g/dL (ref 13.0–17.0)
LYMPHS PCT: 27.7 % (ref 12.0–46.0)
Lymphs Abs: 1.7 10*3/uL (ref 0.7–4.0)
MCHC: 33.3 g/dL (ref 30.0–36.0)
MCV: 91.8 fl (ref 78.0–100.0)
Monocytes Absolute: 0.7 10*3/uL (ref 0.1–1.0)
Monocytes Relative: 11.5 % (ref 3.0–12.0)
NEUTROS ABS: 3.6 10*3/uL (ref 1.4–7.7)
NEUTROS PCT: 59.4 % (ref 43.0–77.0)
PLATELETS: 277 10*3/uL (ref 150.0–400.0)
RBC: 4.57 Mil/uL (ref 4.22–5.81)
RDW: 13.1 % (ref 11.5–15.5)
WBC: 6.1 10*3/uL (ref 4.0–10.5)

## 2014-06-21 LAB — BASIC METABOLIC PANEL
BUN: 22 mg/dL (ref 6–23)
CO2: 27 mEq/L (ref 19–32)
Calcium: 9.6 mg/dL (ref 8.4–10.5)
Chloride: 105 mEq/L (ref 96–112)
Creatinine, Ser: 0.95 mg/dL (ref 0.40–1.50)
GFR: 83.9 mL/min (ref >60.00)
GLUCOSE: 98 mg/dL (ref 70–99)
Potassium: 5 mEq/L (ref 3.5–5.1)
SODIUM: 139 meq/L (ref 135–145)

## 2014-06-21 LAB — LIPID PANEL
CHOL/HDL RATIO: 4
Cholesterol: 188 mg/dL (ref 0–200)
HDL: 47.4 mg/dL (ref >39.00)
LDL Cholesterol: 104 mg/dL — ABNORMAL HIGH (ref 0–99)
NonHDL: 140.6
Triglycerides: 184 mg/dL — ABNORMAL HIGH (ref 0.0–149.0)
VLDL: 36.8 mg/dL (ref 0.0–40.0)

## 2014-06-21 LAB — HEPATIC FUNCTION PANEL
ALBUMIN: 4.4 g/dL (ref 3.5–5.2)
ALK PHOS: 61 U/L (ref 39–117)
ALT: 14 U/L (ref 0–53)
AST: 17 U/L (ref 0–37)
BILIRUBIN TOTAL: 0.6 mg/dL (ref 0.2–1.2)
Bilirubin, Direct: 0.1 mg/dL (ref 0.0–0.3)
TOTAL PROTEIN: 6.8 g/dL (ref 6.0–8.3)

## 2014-06-21 LAB — TSH: TSH: 1.35 u[IU]/mL (ref 0.35–4.50)

## 2014-06-21 LAB — PSA: PSA: 0.36 ng/mL (ref 0.10–4.00)

## 2014-06-24 ENCOUNTER — Telehealth: Payer: Self-pay | Admitting: Family Medicine

## 2014-06-24 ENCOUNTER — Ambulatory Visit (INDEPENDENT_AMBULATORY_CARE_PROVIDER_SITE_OTHER): Payer: BLUE CROSS/BLUE SHIELD | Admitting: Family Medicine

## 2014-06-24 ENCOUNTER — Encounter: Payer: Self-pay | Admitting: Family Medicine

## 2014-06-24 VITALS — BP 134/82 | HR 68 | Temp 97.8°F | Ht 73.0 in | Wt 191.8 lb

## 2014-06-24 DIAGNOSIS — Z0000 Encounter for general adult medical examination without abnormal findings: Secondary | ICD-10-CM

## 2014-06-24 MED ORDER — LISINOPRIL 20 MG PO TABS: 20.0000 mg | ORAL_TABLET | Freq: Every day | ORAL | Status: DC

## 2014-06-24 MED ORDER — MELOXICAM 15 MG PO TABS: 15.0000 mg | ORAL_TABLET | ORAL | Status: DC | PRN

## 2014-06-24 NOTE — Patient Instructions (Signed)
Let us know if you change your mind regarding colonoscopy, shingles vaccine, or Prevnar 13.

## 2014-06-24 NOTE — Telephone Encounter (Signed)
Please refill his meds for one year through local pharmacy.

## 2014-06-24 NOTE — Telephone Encounter (Signed)
Done

## 2014-06-24 NOTE — Telephone Encounter (Signed)
Pt said he will not be using the mail order service for his rx. He said he will stay with [facility] . Pt said he had a conversation with Dr Elease Hashimoto about mail order but he changed his mind

## 2014-06-24 NOTE — Progress Notes (Signed)
Pre visit review using our clinic review tool, if applicable. No additional management support is needed unless otherwise documented below in the visit note. 

## 2014-06-24 NOTE — Progress Notes (Signed)
   Subjective:    Patient ID: Bryce Wood, male    DOB: 08-04-1945, 69 y.o.   MRN: [id number]  HPI Patient seen for complete physical. He has hypertension treated with lisinopril.  Mild dyslipidemia. Osteoarthritis involving multiple joints. Currently stable on Tylenol. He supplements with meloxicam. He has not had previous colonoscopy or shingles vaccine and declines both. Also no history of Prevnar 13.  Has previously walked for exercise. Limited by osteoarthritis and looking at alternatives.  Past Medical History  Diagnosis Date  . HYPERLIPIDEMIA 11/21/2008  . HYPERTENSION 08/01/2008  . OSTEOARTHRITIS 08/01/2008  . ARTHRITIS 08/01/2008   Past Surgical History  Procedure Laterality Date  . Hernia repair  1979  . Spine surgery  1999    disk    reports that he has never smoked. He does not have any smokeless tobacco history on file. His alcohol and drug histories are not on file. family history includes Alcohol abuse in his other; Arthritis in his other; Cancer (age of onset: 53) in his paternal uncle; Hypertension in his other; Leukemia in his other; Stroke in his other. No Known Allergies    Review of Systems  Constitutional: Negative for fever, activity change, appetite change and fatigue.  HENT: Negative for congestion, ear pain and trouble swallowing.   Eyes: Negative for pain and visual disturbance.  Respiratory: Negative for cough, shortness of breath and wheezing.   Cardiovascular: Negative for chest pain and palpitations.  Gastrointestinal: Negative for nausea, vomiting, abdominal pain, diarrhea, constipation, blood in stool, abdominal distention and rectal pain.  Endocrine: Negative for polydipsia and polyuria.  Genitourinary: Negative for dysuria, hematuria and testicular pain.  Musculoskeletal: Positive for arthralgias. Negative for joint swelling.  Skin: Negative for rash.  Neurological: Negative for dizziness, syncope and headaches.  Hematological: Negative for  adenopathy.  Psychiatric/Behavioral: Negative for confusion and dysphoric mood.       Objective:   Physical Exam  Constitutional: He is oriented to person, place, and time. He appears well-developed and well-nourished. No distress.  HENT:  Head: Normocephalic and atraumatic.  Right Ear: External ear normal.  Left Ear: External ear normal.  Mouth/Throat: Oropharynx is clear and moist.  Eyes: Conjunctivae and EOM are normal. Pupils are equal, round, and reactive to light.  Neck: Normal range of motion. Neck supple. No thyromegaly present.  Cardiovascular: Normal rate, regular rhythm and normal heart sounds.   No murmur heard. Pulmonary/Chest: No respiratory distress. He has no wheezes. He has no rales.  Abdominal: Soft. Bowel sounds are normal. He exhibits no distension and no mass. There is no tenderness. There is no rebound and no guarding.  Musculoskeletal: He exhibits no edema.  Lymphadenopathy:    He has no cervical adenopathy.  Neurological: He is alert and oriented to person, place, and time. He displays normal reflexes. No cranial nerve deficit.  Skin: No rash noted.  Psychiatric: He has a normal mood and affect.          Assessment & Plan:  Complete physical. Labs reviewed. No major concerns. He has not had previous colonoscopy, shingles vaccine, or Prevnar 13. He declines all 3. We'll encourage him to let us know if he changes his mind

## 2014-06-24 NOTE — Telephone Encounter (Signed)
Noted  

## 2014-06-24 NOTE — Addendum Note (Signed)
Addended by: Santiago Bumpers on: 06/24/2014 01:42 PM   Modules accepted: Orders

## 2014-07-15 ENCOUNTER — Other Ambulatory Visit: Payer: Self-pay | Admitting: Family Medicine

## 2014-07-27 ENCOUNTER — Telehealth: Payer: Self-pay | Admitting: Family Medicine

## 2014-07-27 MED ORDER — LISINOPRIL 20 MG PO TABS: 20.0000 mg | ORAL_TABLET | Freq: Every day | ORAL | Status: DC

## 2014-07-27 NOTE — Telephone Encounter (Signed)
Rx sent to mail order

## 2014-07-27 NOTE — Telephone Encounter (Signed)
Pt needs new rx lisinopril 20 mg #90 w/refills  fax to [facility].

## 2014-07-28 ENCOUNTER — Other Ambulatory Visit: Payer: Self-pay | Admitting: Family Medicine

## 2014-08-16 ENCOUNTER — Other Ambulatory Visit: Payer: Self-pay | Admitting: Family Medicine

## 2014-08-16 MED ORDER — MELOXICAM 15 MG PO TABS: 15.0000 mg | ORAL_TABLET | ORAL | Status: DC | PRN

## 2014-08-16 NOTE — Telephone Encounter (Signed)
Pt request refill of the following: meloxicam (MOBIC) 15 MG tablet     Phamacy: [facility]   [phone]

## 2014-08-16 NOTE — Telephone Encounter (Signed)
Rx done. 

## 2014-09-24 ENCOUNTER — Other Ambulatory Visit: Payer: Self-pay | Admitting: Family Medicine

## 2014-10-09 ENCOUNTER — Other Ambulatory Visit: Payer: Self-pay | Admitting: Family Medicine

## 2014-11-28 ENCOUNTER — Encounter: Payer: Self-pay | Admitting: Family Medicine

## 2014-11-28 ENCOUNTER — Ambulatory Visit (INDEPENDENT_AMBULATORY_CARE_PROVIDER_SITE_OTHER): Payer: BLUE CROSS/BLUE SHIELD | Admitting: Family Medicine

## 2014-11-28 ENCOUNTER — Other Ambulatory Visit (INDEPENDENT_AMBULATORY_CARE_PROVIDER_SITE_OTHER): Payer: BLUE CROSS/BLUE SHIELD

## 2014-11-28 ENCOUNTER — Other Ambulatory Visit: Payer: BLUE CROSS/BLUE SHIELD

## 2014-11-28 VITALS — BP 130/80 | HR 60 | Ht 73.0 in | Wt 194.0 lb

## 2014-11-28 DIAGNOSIS — M25512 Pain in left shoulder: Secondary | ICD-10-CM

## 2014-11-28 DIAGNOSIS — M2142 Flat foot [pes planus] (acquired), left foot: Secondary | ICD-10-CM | POA: Diagnosis not present

## 2014-11-28 DIAGNOSIS — M129 Arthropathy, unspecified: Secondary | ICD-10-CM

## 2014-11-28 DIAGNOSIS — M25511 Pain in right shoulder: Secondary | ICD-10-CM

## 2014-11-28 DIAGNOSIS — M2141 Flat foot [pes planus] (acquired), right foot: Secondary | ICD-10-CM | POA: Diagnosis not present

## 2014-11-28 DIAGNOSIS — M19012 Primary osteoarthritis, left shoulder: Secondary | ICD-10-CM

## 2014-11-28 MED ORDER — TRIAMCINOLONE ACETONIDE 0.5 % EX CREA: 1.0000 "application " | TOPICAL_CREAM | Freq: Two times a day (BID) | CUTANEOUS | Status: DC

## 2014-11-28 MED ORDER — DICLOFENAC SODIUM 2 % TD SOLN: TRANSDERMAL | Status: DC

## 2014-11-28 NOTE — Assessment & Plan Note (Signed)
Severe in nature. Patient's custom orthotics seem to be breaking down molding after one year. We were hoping for 2 years. Patient would like to try over-the-counter orthotics and given suggestions. We discussed going back to the icing protocol. Patient given topical anti-inflammatories. We discussed which activities to do an which was to avoid. Patient will avoid being barefoot. Patient come back and see me again in 4 weeks to make sure he is responding to conservative therapy. If not we would need to refer him for special orthotics.

## 2014-11-28 NOTE — Progress Notes (Signed)
Pre visit review using our clinic review tool, if applicable. No additional management support is needed unless otherwise documented below in the visit note. 

## 2014-11-28 NOTE — Patient Instructions (Addendum)
Good to see you Ice 20 minutes 2 times daily. Usually after activity and before bed. Exercises 3 times a week.  For the shoulder For the foot try spenco orthotics "total support" pennsaid pinkie amount topically 2 times daily as needed.  Continue the tylenol See me again in 4 weeks if any pain not improving.

## 2014-11-28 NOTE — Assessment & Plan Note (Signed)
Patient given injection today and tolerated the procedure very well. We discussed icing regimen and home exercises. We discussed which activities to do an which ones to potentially avoid. he'll try topical anti-inflammatories. Patient will come back and see me again in 4 weeks for further evaluation.

## 2014-11-28 NOTE — Assessment & Plan Note (Signed)
Patient we'll try topical anti-inflammatory for the hands and will continue with over-the-counter natural supplementation to help decrease the pain too much more bearable level.

## 2014-11-28 NOTE — Progress Notes (Signed)
[facility] [address], [place] [address] Phone: [phone] Subjective:    [id number] foot pain  [id number] Bryce Wood is a 69 y.o. male coming in with complaint of worsening foot pain. Patient was seen previously and did have severe pes planus of the foot as well as severe osteophytic changes of the midfoot. Patient will need custom orthotics previously. Has been doing very well for the last year. Feels that the pain is coming back mostly in the left foot. Giving her more pain with even daily activities. Starting to walk with a limp.  Patient is also complaining of left shoulder pain. Has known arthritic changes of this left shoulder. Has not had any other intervention previously. No radiation or weakness. Patient states that it as a dull throbbing aching sensation that is starting to affect his daily activities. Notices that it seems that arm fatigues with overhead activity. Some sharp pain with certain movements.   chronic also her arthralgia noted mostly in the hands. Not doing anything other than scheduled Tylenol and tumeric   Past medical history, social, surgical and family history all reviewed in electronic medical record.   Review of Systems: No headache, visual changes, nausea, vomiting, diarrhea, constipation, dizziness, abdominal pain, skin rash, fevers, chills, night sweats, weight loss, swollen lymph nodes, body aches, joint swelling, muscle aches, chest pain, shortness of breath, mood changes.   Objective Blood pressure 130/80, pulse 60, height 6\' 1"  (1.854 m), weight 194 lb (87.998 kg), SpO2 97 %.  General: No apparent distress alert and oriented x3 mood and affect normal, dressed appropriately.  HEENT: Pupils equal, extraocular movements intact  Respiratory: Patient's speak in full sentences and does not appear short of breath  Cardiovascular: No lower extremity edema, non tender, no erythema patient does have severe varicose  veins of the legs bilaterally left greater than right Skin: Warm dry intact with no signs of infection or rash on extremities or on axial skeleton.  Abdomen: Soft nontender  Neuro: Cranial nerves II through XII are intact, neurovascularly intact in all extremities with 2+ DTRs and 2+ pulses.  Lymph: No lymphadenopathy of posterior or anterior cervical chain or axillae bilaterally.  Gait normal with good balance and coordination.  MSK:  Non tender with full range of motion and good stability and symmetric strength and tone of , elbows, wrist, hip, and ankles bilaterally.   Foot exam shows the patient does have severe pes planus bilaterally. Patient also has severe osteophytic changes of the navicular bone as well as of the midfoot.patient is tender to palpation over this area. No movement whatsoever in the midfoot. Neurovascularly intact distally.   Shoulder: left Inspection reveals no abnormalities, atrophy or asymmetry. Palpation is normal with no tenderness over AC joint or bicipital groove. ROM is full in all planes passively. Rotator cuff strength normal throughout. signs of impingement with positive Neer and Hawkin's tests, but negative empty can sign. Speeds and Yergason's tests normal. No labral pathology noted with negative Obrien's, negative clunk and good stability. Normal scapular function observed. No painful arc and no drop arm sign. No apprehension sign    Procedure: Real-time Ultrasound Guided Injection of left glenohumeral joint Device: GE Logiq E  Ultrasound guided injection is preferred based studies that show increased duration, increased effect, greater accuracy, decreased procedural pain, increased response rate with ultrasound guided versus blind injection.  Verbal informed consent obtained.  Time-out conducted.  Noted no overlying erythema, induration, or other signs of local  infection.  Skin prepped in a sterile fashion.  Local anesthesia: Topical Ethyl  chloride.  With sterile technique and under real time ultrasound guidance:  Joint visualized.  23g 1  inch needle inserted posterior approach. Pictures taken for needle placement. Patient did have injection of 2 cc of 1% lidocaine, 2 cc of 0.5% Marcaine, and 1.0 cc of Kenalog 40 mg/dL. Completed without difficulty  Pain immediately resolved suggesting accurate placement of the medication.  Advised to call if fevers/chills, erythema, induration, drainage, or persistent bleeding.  Images permanently stored and available for review in the ultrasound unit.  Impression: Technically successful ultrasound guided injection.     Impression and Recommendations:

## 2014-12-26 ENCOUNTER — Ambulatory Visit: Payer: BLUE CROSS/BLUE SHIELD | Admitting: Family Medicine

## 2015-03-23 ENCOUNTER — Telehealth: Payer: Self-pay | Admitting: Family Medicine

## 2015-03-23 NOTE — Telephone Encounter (Signed)
Called pt back, dr Tamala Julian does not have anything open this afternoon. Pt scheduled for 3.13.17 @ 3pm,

## 2015-03-23 NOTE — Telephone Encounter (Signed)
Pt was hoping to get worked in as late as possible today. Please advise

## 2015-03-25 ENCOUNTER — Other Ambulatory Visit: Payer: Self-pay | Admitting: Family Medicine

## 2015-03-27 ENCOUNTER — Encounter: Payer: Self-pay | Admitting: Family Medicine

## 2015-03-27 ENCOUNTER — Ambulatory Visit (INDEPENDENT_AMBULATORY_CARE_PROVIDER_SITE_OTHER): Payer: BLUE CROSS/BLUE SHIELD | Admitting: Family Medicine

## 2015-03-27 VITALS — BP 122/82 | HR 63 | Wt 193.0 lb

## 2015-03-27 DIAGNOSIS — M129 Arthropathy, unspecified: Secondary | ICD-10-CM

## 2015-03-27 DIAGNOSIS — M19012 Primary osteoarthritis, left shoulder: Secondary | ICD-10-CM

## 2015-03-27 NOTE — Progress Notes (Signed)
[name] [place], [name] [address] Phone: [phone] Subjective:    [id number] shoulder pain follow-up  [id number] Bryce Wood is a 70 y.o. male coming in with complaint of left shoulder pain.  Though did respond very well 5 months ago to an injection. Patient states that it is stated wake him up at night again over the course last week here. Patient is working 60 hours a week and thinks that this is contributing. Not doing the exercises regularly and not doing the icing. When he was it seemed to be doing better. Continues to take Tylenol fairly regularly. No new symptoms is worsening a previous symptoms.  Past Medical History  Diagnosis Date  . HYPERLIPIDEMIA 11/21/2008  . HYPERTENSION 08/01/2008  . OSTEOARTHRITIS 08/01/2008  . ARTHRITIS 08/01/2008   Past Surgical History  Procedure Laterality Date  . Hernia repair  1979  . Spine surgery  1999    disk   Social History  Substance Use Topics  . Smoking status: Never Smoker   . Smokeless tobacco: None  . Alcohol Use: None   No Known Allergies no known drug allergies Family History  Problem Relation Age of Onset  . Leukemia Other   . Stroke Other   . Alcohol abuse Other   . Arthritis Other   . Hypertension Other   . Cancer Paternal Uncle 66    prostate cancer        Past medical history, social, surgical and family history all reviewed in electronic medical record.   Review of Systems: No headache, visual changes, nausea, vomiting, diarrhea, constipation, dizziness, abdominal pain, skin rash, fevers, chills, night sweats, weight loss, swollen lymph nodes, body aches, joint swelling, muscle aches, chest pain, shortness of breath, mood changes.   Objective Blood pressure 122/82, pulse 63, weight 193 lb (87.544 kg), SpO2 99 %.  General: No apparent distress alert and oriented x3 mood and affect normal, dressed appropriately.  HEENT: Pupils equal, extraocular movements intact   Respiratory: Patient's speak in full sentences and does not appear short of breath  Cardiovascular: No lower extremity edema, non tender, no erythema patient does have severe varicose veins of the legs bilaterally left greater than right Skin: Warm dry intact with no signs of infection or rash on extremities or on axial skeleton.  Abdomen: Soft nontender  Neuro: Cranial nerves II through XII are intact, neurovascularly intact in all extremities with 2+ DTRs and 2+ pulses.  Lymph: No lymphadenopathy of posterior or anterior cervical chain or axillae bilaterally.  Gait normal with good balance and coordination.  MSK:  Non tender with full range of motion and good stability and symmetric strength and tone of , elbows, wrist, hip, and ankles bilaterally.     Shoulder: left Inspection reveals mild atrophy of the posterior musculature of the shoulders bilaterally. Palpation is normal with no tenderness over AC joint or bicipital groove. ROM is full in all planes passively. Rotator cuff strength normal throughout. signs of impingement with positive Neer and Hawkin's tests, but negative empty can sign. Mild crepitus noted which is new from previous exam Speeds and Yergason's tests normal. No labral pathology noted with negative Obrien's, negative clunk and good stability. Normal scapular function observed. No painful arc and no drop arm sign. No apprehension sign Contralateral side has some crepitus is well but no significant pain. No impingement signs.   Procedure: Real-time Ultrasound Guided Injection of left glenohumeral joint Device: GE Logiq E  Ultrasound guided injection  is preferred based studies that show increased duration, increased effect, greater accuracy, decreased procedural pain, increased response rate with ultrasound guided versus blind injection.  Verbal informed consent obtained.  Time-out conducted.  Noted no overlying erythema, induration, or other signs of local  infection.  Skin prepped in a sterile fashion.  Local anesthesia: Topical Ethyl chloride.  With sterile technique and under real time ultrasound guidance:  Joint visualized.  23g 1  inch needle inserted posterior approach. Pictures taken for needle placement. Patient did have injection of 2 cc of 1% lidocaine, 2 cc of 0.5% Marcaine, and 1.0 cc of Kenalog 40 mg/dL. Completed without difficulty  Pain immediately resolved suggesting accurate placement of the medication.  Advised to call if fevers/chills, erythema, induration, drainage, or persistent bleeding.  Images permanently stored and available for review in the ultrasound unit.  Impression: Technically successful ultrasound guided injection.     Impression and Recommendations:

## 2015-03-27 NOTE — Patient Instructions (Signed)
Good to see  Exercises 3 times a week. Do this religiously for next 6 weeks Ice 20 minutes 2 times daily. Usually after activity and before bed. Continue with the pennsaid pennsaid pinkie amount topically 2 times daily as needed.   Keep hands within peripheral vision.   Vitamin D 2000 Iu daily  Stay active See me again in 6 weeks to make sure you are doing well.

## 2015-03-27 NOTE — Assessment & Plan Note (Signed)
Patient given injection today. Tolerated procedure well. We discussed icing regimen and home exercises. We discussed continuing the over-the-counter medications for arthritic changes. We also discussed possible formal physical therapy which patient declined secondary to the amount of work he is doing. Patient given home exercises in an exercise prescription. Patient will come back and see me again in 6 weeks for further evaluation and treatment. If worsening symptoms possible x-rays and advance imaging would be warranted.

## 2015-05-04 ENCOUNTER — Other Ambulatory Visit: Payer: Self-pay | Admitting: Family Medicine

## 2015-05-04 MED ORDER — LISINOPRIL 20 MG PO TABS: 20.0000 mg | ORAL_TABLET | Freq: Every day | ORAL | Status: DC

## 2015-05-04 MED ORDER — MELOXICAM 15 MG PO TABS: ORAL_TABLET | ORAL | Status: DC

## 2015-05-08 ENCOUNTER — Ambulatory Visit: Payer: BLUE CROSS/BLUE SHIELD | Admitting: Family Medicine

## 2015-06-19 ENCOUNTER — Other Ambulatory Visit (INDEPENDENT_AMBULATORY_CARE_PROVIDER_SITE_OTHER): Payer: BLUE CROSS/BLUE SHIELD

## 2015-06-19 DIAGNOSIS — Z0000 Encounter for general adult medical examination without abnormal findings: Secondary | ICD-10-CM

## 2015-06-19 LAB — LIPID PANEL
Cholesterol: 188 mg/dL (ref 0–200)
HDL: 47 mg/dL (ref >39.00)
LDL Cholesterol: 110 mg/dL — ABNORMAL HIGH (ref 0–99)
NonHDL: 140.68
Total CHOL/HDL Ratio: 4
Triglycerides: 152 mg/dL — ABNORMAL HIGH (ref 0.0–149.0)
VLDL: 30.4 mg/dL (ref 0.0–40.0)

## 2015-06-19 LAB — HEPATIC FUNCTION PANEL
ALT: 14 U/L (ref 0–53)
AST: 20 U/L (ref 0–37)
Albumin: 4.3 g/dL (ref 3.5–5.2)
Alkaline Phosphatase: 57 U/L (ref 39–117)
Bilirubin, Direct: 0.1 mg/dL (ref 0.0–0.3)
Total Bilirubin: 0.6 mg/dL (ref 0.2–1.2)
Total Protein: 6.6 g/dL (ref 6.0–8.3)

## 2015-06-19 LAB — CBC WITH DIFFERENTIAL/PLATELET
Basophils Absolute: 0 10*3/uL (ref 0.0–0.1)
Basophils Relative: 0.7 % (ref 0.0–3.0)
Eosinophils Absolute: 0.1 10*3/uL (ref 0.0–0.7)
Eosinophils Relative: 1 % (ref 0.0–5.0)
HCT: 40.5 % (ref 39.0–52.0)
Hemoglobin: 13.8 g/dL (ref 13.0–17.0)
Lymphocytes Relative: 33.6 % (ref 12.0–46.0)
Lymphs Abs: 1.8 10*3/uL (ref 0.7–4.0)
MCHC: 34 g/dL (ref 30.0–36.0)
MCV: 91.9 fl (ref 78.0–100.0)
Monocytes Absolute: 0.7 10*3/uL (ref 0.1–1.0)
Monocytes Relative: 12.5 % — ABNORMAL HIGH (ref 3.0–12.0)
Neutro Abs: 2.7 10*3/uL (ref 1.4–7.7)
Neutrophils Relative %: 52.2 % (ref 43.0–77.0)
Platelets: 257 10*3/uL (ref 150.0–400.0)
RBC: 4.41 Mil/uL (ref 4.22–5.81)
RDW: 13.4 % (ref 11.5–15.5)
WBC: 5.2 10*3/uL (ref 4.0–10.5)

## 2015-06-19 LAB — BASIC METABOLIC PANEL
BUN: 20 mg/dL (ref 6–23)
CHLORIDE: 104 meq/L (ref 96–112)
CO2: 30 mEq/L (ref 19–32)
Calcium: 9.6 mg/dL (ref 8.4–10.5)
Creatinine, Ser: 0.92 mg/dL (ref 0.40–1.50)
GFR: 86.81 mL/min (ref >60.00)
Glucose, Bld: 97 mg/dL (ref 70–99)
POTASSIUM: 4.8 meq/L (ref 3.5–5.1)
Sodium: 140 mEq/L (ref 135–145)

## 2015-06-19 LAB — TSH: TSH: 1.11 u[IU]/mL (ref 0.35–4.50)

## 2015-06-19 LAB — PSA: PSA: 0.4 ng/mL (ref 0.10–4.00)

## 2015-07-03 ENCOUNTER — Encounter: Payer: Self-pay | Admitting: Family Medicine

## 2015-07-03 ENCOUNTER — Ambulatory Visit (INDEPENDENT_AMBULATORY_CARE_PROVIDER_SITE_OTHER): Payer: BLUE CROSS/BLUE SHIELD | Admitting: Family Medicine

## 2015-07-03 VITALS — BP 140/90 | HR 66 | Temp 98.0°F | Ht 73.0 in | Wt 192.0 lb

## 2015-07-03 DIAGNOSIS — M79672 Pain in left foot: Secondary | ICD-10-CM | POA: Diagnosis not present

## 2015-07-03 DIAGNOSIS — Z0000 Encounter for general adult medical examination without abnormal findings: Secondary | ICD-10-CM | POA: Diagnosis not present

## 2015-07-03 MED ORDER — LISINOPRIL 20 MG PO TABS: 20.0000 mg | ORAL_TABLET | Freq: Every day | ORAL | Status: DC

## 2015-07-03 MED ORDER — MELOXICAM 15 MG PO TABS: ORAL_TABLET | ORAL | Status: DC

## 2015-07-03 NOTE — Progress Notes (Signed)
Pre visit review using our clinic review tool, if applicable. No additional management support is needed unless otherwise documented below in the visit note. 

## 2015-07-03 NOTE — Patient Instructions (Addendum)
Check on Cologuard and let me know if interested in [place] ordering for you. We will set up referral to Dr Doran Durand.

## 2015-07-03 NOTE — Progress Notes (Signed)
Subjective:    Patient ID: Bryce Wood, male    DOB: [birth year], 70 y.o.   MRN: [id number]  HPI Patient is here for physical exam. He has history of osteoarthritis, hypertension, mild hyperlipidemia. Has never had colonoscopy. He has declined repeatedly in the past. He is willing to consider Cologuard- if he has insurance coverage He declines shingles vaccine and also Prevnar 13. He's had prior Pneumovax Nonsmoker. Still works full-time as a Freight forwarder at [employer].  He's had some problems with chronic left foot pain. He's tried various inserts and orthotics without much improvement. Like to consider orthopedic referral. Spends much of his time each day on his feet on hard surfaces  Past Medical History  Diagnosis Date  . HYPERLIPIDEMIA 11/21/2008  . HYPERTENSION 08/01/2008  . OSTEOARTHRITIS 08/01/2008  . ARTHRITIS 08/01/2008   Past Surgical History  Procedure Laterality Date  . Hernia repair  1979  . Spine surgery  1999    disk    reports that he has never smoked. He does not have any smokeless tobacco history on file. His alcohol and drug histories are not on file. family history includes Alcohol abuse in his other; Arthritis in his other; Cancer (age of onset: 52) in his paternal uncle; Heart attack in his mother; Heart disease in his mother; Hypertension in his father and other; Leukemia in his other; Stroke in his father and other. No Known Allergies    Review of Systems  Constitutional: Negative for fever, activity change, appetite change and fatigue.  HENT: Negative for congestion, ear pain and trouble swallowing.   Eyes: Negative for pain and visual disturbance.  Respiratory: Negative for cough, shortness of breath and wheezing.   Cardiovascular: Negative for chest pain and palpitations.  Gastrointestinal: Negative for nausea, vomiting, abdominal pain, diarrhea, constipation, blood in stool, abdominal distention and rectal pain.  Genitourinary: Negative for dysuria,  hematuria and testicular pain.  Musculoskeletal: Positive for arthralgias. Negative for joint swelling.  Skin: Negative for rash.  Neurological: Negative for dizziness, syncope and headaches.  Hematological: Negative for adenopathy.  Psychiatric/Behavioral: Negative for confusion and dysphoric mood.       Objective:   Physical Exam  Constitutional: He is oriented to person, place, and time. He appears well-developed and well-nourished. No distress.  HENT:  Head: Normocephalic and atraumatic.  Right Ear: External ear normal.  Left Ear: External ear normal.  Mouth/Throat: Oropharynx is clear and moist.  Eyes: Conjunctivae and EOM are normal. Pupils are equal, round, and reactive to light.  Neck: Normal range of motion. Neck supple. No thyromegaly present.  Cardiovascular: Normal rate, regular rhythm and normal heart sounds.   No murmur heard. Pulmonary/Chest: No respiratory distress. He has no wheezes. He has no rales.  Abdominal: Soft. Bowel sounds are normal. He exhibits no distension and no mass. There is no tenderness. There is no rebound and no guarding.  Musculoskeletal: He exhibits no edema.  He has some hypertrophic bony changes of the left foot in the mid portion of the foot Prominent varicose veins left lower extremity  Lymphadenopathy:    He has no cervical adenopathy.  Neurological: He is alert and oriented to person, place, and time. He displays normal reflexes. No cranial nerve deficit.  Skin: No rash noted.  Psychiatric: He has a normal mood and affect.          Assessment & Plan:  Physical exam. Labs reviewed. 10 year risk of CAD event 21%. He declines statin or aspirin use. Patient is  willing to consider DNA base stool testing. He will check on insurance coverage first. Will set up referral to see foot orthopedic specialist regarding his chronic left foot pain  Eulas Post MD [facility]

## 2015-07-28 ENCOUNTER — Ambulatory Visit: Payer: BLUE CROSS/BLUE SHIELD | Admitting: Family Medicine

## 2015-08-21 DIAGNOSIS — M5136 Other intervertebral disc degeneration, lumbar region: Secondary | ICD-10-CM | POA: Diagnosis not present

## 2015-08-21 DIAGNOSIS — M5417 Radiculopathy, lumbosacral region: Secondary | ICD-10-CM | POA: Diagnosis not present

## 2015-08-22 ENCOUNTER — Telehealth: Payer: Self-pay | Admitting: Family Medicine

## 2015-08-22 NOTE — Telephone Encounter (Signed)
° °  Pt called in to say that he would like to have the colon guard test. He checked with his insurance and they will cover Would like a call back

## 2015-08-23 NOTE — Telephone Encounter (Signed)
Order for colo guard has been faxed. They will contact pt with details.

## 2015-08-28 DIAGNOSIS — Z1211 Encounter for screening for malignant neoplasm of colon: Secondary | ICD-10-CM | POA: Diagnosis not present

## 2015-08-28 DIAGNOSIS — Z1212 Encounter for screening for malignant neoplasm of rectum: Secondary | ICD-10-CM | POA: Diagnosis not present

## 2015-08-30 ENCOUNTER — Telehealth: Payer: Self-pay | Admitting: Family Medicine

## 2015-08-30 MED ORDER — LISINOPRIL 20 MG PO TABS: 20.0000 mg | ORAL_TABLET | Freq: Every day | ORAL | 3 refills | Status: DC

## 2015-08-30 NOTE — Telephone Encounter (Signed)
Medication sent in for patient. 

## 2015-08-30 NOTE — Telephone Encounter (Signed)
Pt want his RX for Lisinopril 20 MG Capsule sent to [facility] at [place].

## 2015-09-06 LAB — COLOGUARD: COLOGUARD: NEGATIVE

## 2015-09-07 ENCOUNTER — Encounter: Payer: Self-pay | Admitting: Family Medicine

## 2015-10-17 NOTE — Progress Notes (Signed)
[name] [address] [place], [place] [address] Phone: [phone] Subjective:    [id number] shoulder pain follow-up  [id number]  Bryce Wood is a 70 y.o. male coming in with complaint of left shoulder pain.  Patient does have arthritis of the left shoulder. Last seen 6 months ago and was given an injection. Patient has continued with conservative therapy since then. Patient states Worsen previous exam. Patient is having pain that is waking him up at night. An states that certain activities such as golfing has become more difficult as well. Patient states where he has the injections he seems to do better though.  Also has a new problem. Has noticed that he has been having more of a tremor in his hands. Seems to be bilateral. Only with certain activities. When asked he states that it does seem to be related with his caffeine intake. Past Medical History:  Diagnosis Date  . ARTHRITIS 08/01/2008  . HYPERLIPIDEMIA 11/21/2008  . HYPERTENSION 08/01/2008  . OSTEOARTHRITIS 08/01/2008   Past Surgical History:  Procedure Laterality Date  . HERNIA REPAIR  1979  . SPINE SURGERY  1999   disk   Social History  Substance Use Topics  . Smoking status: Never Smoker  . Smokeless tobacco: Not on file  . Alcohol use Not on file   No Known Allergies no known drug allergies Family History  Problem Relation Age of Onset  . Leukemia Other   . Stroke Other   . Alcohol abuse Other   . Arthritis Other   . Hypertension Other   . Cancer Paternal Uncle 61    prostate cancer  . Heart disease Mother   . Heart attack Mother   . Hypertension Father   . Stroke Father         Past medical history, social, surgical and family history all reviewed in electronic medical record.   Review of Systems: No headache, visual changes, nausea, vomiting, diarrhea, constipation, dizziness, abdominal pain, skin rash, fevers, chills, night sweats, weight loss, swollen lymph nodes,  body aches, joint swelling, muscle aches, chest pain, shortness of breath, mood changes.   Objective  There were no vitals taken for this visit.  General: No apparent distress alert and oriented x3 mood and affect normal, dressed appropriately.  HEENT: Pupils equal, extraocular movements intact  Respiratory: Patient's speak in full sentences and does not appear short of breath  Cardiovascular: No lower extremity edema, non tender, no erythema patient does have severe varicose veins of the legs bilaterally left greater than right Skin: Warm dry intact with no signs of infection or rash on extremities or on axial skeleton.  Abdomen: Soft nontender  Neuro: Cranial nerves II through XII are intact, neurovascularly intact in all extremities with 2+ DTRs and 2+ pulses.  Lymph: No lymphadenopathy of posterior or anterior cervical chain or axillae bilaterally.  Gait normal with good balance and coordination.  MSK:  Non tender with full range of motion and good stability and symmetric strength and tone of , elbows, wrist, hip, and ankles bilaterally. No significant tremor noted today    Shoulder: left Continued atrophy of the musculature of the shoulder but seems stable Palpation is normal with no tenderness over AC joint or bicipital groove. Lacking the last 5 of external rotation Rotator cuff strength normal throughout. signs of impingement with positive Neer and Hawkin's tests, but negative empty can sign. Mild crepitus noted  Speeds and Yergason's tests normal. Mild positive crossover which is  new Normal scapular function observed. No painful arc and no drop arm sign. No apprehension sign Contralateral side has some crepitus is well but no significant pain. No impingement signs. Worsening symptoms from previous exam   Procedure: Real-time Ultrasound Guided Injection of left glenohumeral joint Device: GE Logiq E  Ultrasound guided injection is preferred based studies that show increased  duration, increased effect, greater accuracy, decreased procedural pain, increased response rate with ultrasound guided versus blind injection.  Verbal informed consent obtained.  Time-out conducted.  Noted no overlying erythema, induration, or other signs of local infection.  Skin prepped in a sterile fashion.  Local anesthesia: Topical Ethyl chloride.  With sterile technique and under real time ultrasound guidance:  Joint visualized.  23g 1  inch needle inserted posterior approach. Pictures taken for needle placement. Patient did have injection of 2 cc of 1% lidocaine, 2 cc of 0.5% Marcaine, and 1.0 cc of Kenalog 40 mg/dL. Completed without difficulty  Pain immediately resolved suggesting accurate placement of the medication.  Advised to call if fevers/chills, erythema, induration, drainage, or persistent bleeding.  Images permanently stored and available for review in the ultrasound unit.  Impression: Technically successful ultrasound guided injection.     Impression and Recommendations:

## 2015-10-18 ENCOUNTER — Ambulatory Visit (INDEPENDENT_AMBULATORY_CARE_PROVIDER_SITE_OTHER): Payer: PPO | Admitting: Family Medicine

## 2015-10-18 ENCOUNTER — Other Ambulatory Visit: Payer: Self-pay

## 2015-10-18 ENCOUNTER — Encounter: Payer: Self-pay | Admitting: Family Medicine

## 2015-10-18 VITALS — BP 128/80 | HR 70 | Wt 188.0 lb

## 2015-10-18 DIAGNOSIS — G250 Essential tremor: Secondary | ICD-10-CM

## 2015-10-18 DIAGNOSIS — Z23 Encounter for immunization: Secondary | ICD-10-CM | POA: Diagnosis not present

## 2015-10-18 DIAGNOSIS — M19012 Primary osteoarthritis, left shoulder: Secondary | ICD-10-CM

## 2015-10-18 DIAGNOSIS — M25512 Pain in left shoulder: Secondary | ICD-10-CM | POA: Diagnosis not present

## 2015-10-18 NOTE — Patient Instructions (Signed)
Great to see you  Bryce Wood is your friend Stay active pennsaid pinkie amount topically 2 times daily as needed.  Cut down on the caffeine and see if that helps the tremor.  Make an appointment in 4 weeks just in case tremor is still around or otherwise see me when you need me.

## 2015-10-18 NOTE — Assessment & Plan Note (Signed)
Given another injection today. Worsening symptoms. Has meloxicam for break through pain. We discussed icing regimen. Patient come back again on an as-needed basis. We did discuss once again the possibility of x-rays which patient declined. Patient also declined formal physical therapy.

## 2015-10-18 NOTE — Assessment & Plan Note (Signed)
Discussed diet changes first

## 2015-11-02 ENCOUNTER — Other Ambulatory Visit: Payer: PPO

## 2015-11-02 DIAGNOSIS — H5203 Hypermetropia, bilateral: Secondary | ICD-10-CM | POA: Diagnosis not present

## 2015-11-02 DIAGNOSIS — H25813 Combined forms of age-related cataract, bilateral: Secondary | ICD-10-CM | POA: Diagnosis not present

## 2015-11-02 DIAGNOSIS — H01002 Unspecified blepharitis right lower eyelid: Secondary | ICD-10-CM | POA: Diagnosis not present

## 2015-11-02 DIAGNOSIS — M25512 Pain in left shoulder: Secondary | ICD-10-CM

## 2015-11-02 DIAGNOSIS — H35033 Hypertensive retinopathy, bilateral: Secondary | ICD-10-CM | POA: Diagnosis not present

## 2015-11-02 DIAGNOSIS — H52223 Regular astigmatism, bilateral: Secondary | ICD-10-CM | POA: Diagnosis not present

## 2015-11-02 DIAGNOSIS — H01005 Unspecified blepharitis left lower eyelid: Secondary | ICD-10-CM | POA: Diagnosis not present

## 2015-11-02 DIAGNOSIS — H11153 Pinguecula, bilateral: Secondary | ICD-10-CM | POA: Diagnosis not present

## 2015-11-02 DIAGNOSIS — I10 Essential (primary) hypertension: Secondary | ICD-10-CM | POA: Diagnosis not present

## 2015-11-27 DIAGNOSIS — M5417 Radiculopathy, lumbosacral region: Secondary | ICD-10-CM | POA: Diagnosis not present

## 2015-11-27 DIAGNOSIS — M5136 Other intervertebral disc degeneration, lumbar region: Secondary | ICD-10-CM | POA: Diagnosis not present

## 2015-12-19 NOTE — Progress Notes (Addendum)
Subjective:   Bryce Wood is a 70 y.o. male who presents for an Initial Medicare Annual Wellness Visit.  The Patient was informed that the wellness visit is to identify future health risk and educate and initiate measures that can reduce risk for increased disease through the lifespan.    NO ROS; Medicare Wellness Visit  Describes health as good, fair or great? Good States it would be great if his OA was better Now being tx for herniated disc but  Not interfering with exercise or life   Preventive Scr/eening -Counseling & Management   Cologuard: NEG 2017  Current smoking/ tobacco status/never smoked  Second Hand Smoke status; No Smokers in the home  ETOH NO  MEDS No issues; still uses [facility] on [address]   RISK FACTORS Regular exercise  Loves to play golf 3 to 5 times per week Is starting to add planks, educated regarding vertical planks Also thinking about swimming due to having pain when walking Also likes biking  Diet; very healthy at present BMI is WNL; monitors sugar Only drinks one soda now where he did drink 4 a day  Fall risk due to back issues and radiating pain but has not fallen  Mobility of Functional changes this year? No  Safety; community, wears sunscreen ( will start placing sunscreen on hears and nose; sees a dermatologist regularly)   Cardiac Risk Factors:  Advanced aged > 59 in men;  Hyperlipidemia - chol 108; trig 152; HDL 47; LDL 110  HTN medically controlled Diabetes: neg  Family History (mother had HD; MI; father had HTN; stroke Obesity/ BMI 25  Concerned about family hx but he has been doing well  Educated regarding lifestyle risk as well as hereditary. States he notices a "fast" heart rate at time and educated to come in if this occurs to discuss with Dr. Elease Hashimoto as he may decide to run a few test. Educated regarding abnormal HR; as well as atrial fib needs to be ruled out; experiences on pain, dizziness or other symptoms/.   Eye  exam in October by Marica Otter /no issues    Depression Screen PhQ 2: negative  Activities of Daily Living - See functional screen   Cognitive testing; Ad8 score; 0 or less than 2  MMSE deferred or completed if AD8 + 2 issues  Advanced Directives states he is completed  Has spoke to his only dtr and has planned his funeral, last wishes, etc.   Patient Care Team: Eulas Post, MD as PCP - General   Immunization History  Administered Date(s) Administered  . Influenza, High Dose Seasonal PF 10/18/2015  . Pneumococcal Polysaccharide-23 02/11/2012  . Td 09/18/2001, 01/14/2006   Required Immunizations needed today  Screening test up to date or reviewed for plan of completion Health Maintenance Due  Topic Date Due  . Hepatitis C Screening  05/09/1945    Medicare now request all "baby boomers" test for possible exposure to Hepatitis C. Many may have been exposed due to dental work, tatoo's, vaccinations when young. The Hepatitis C virus is dormant for many years and then sometimes will cause liver cancer. If you gave blood in the past 15 years, you were most likely checked for Hep C. If you rec'd blood; you may want to consider testing or if you are high risk for any other reason.    Cardiac Risk Factors include: advanced age (>60men, >4 women);family history of premature cardiovascular disease;hypertension;male genderreviewed and educated regarding shingles;  New vaccine next  year;  Also discussed coverage  Educated regarding the tdap with pertusses, thinks he has had one; computer was done and could not check but educated regarding tdap  Declined prevnar and did review facts per CDC in regard to boosting his immunity against viral and bacterial pneumonia Stated he may take this next year      Objective:    Today's Vitals   12/20/15 0803  BP: 138/80  Pulse: 72  SpO2: 99%  Weight: 189 lb (85.7 kg)  Height: 6\' 1"  (1.854 m)   Body mass index is 24.94  kg/m.  Current Medications (verified) Outpatient Encounter Prescriptions as of 12/20/2015  Medication Sig  . acetaminophen (TYLENOL) 650 MG CR tablet Take 650 mg by mouth 3 (three) times daily.  . cholecalciferol (VITAMIN D) 1000 UNITS tablet Take 2,000 Units by mouth daily.  . Diclofenac Sodium 2 % SOLN Apply 1 pump twice daily as needed.  . fish oil-omega-3 fatty acids 1000 MG capsule Take 1 g by mouth daily.   Marland Kitchen lisinopril (PRINIVIL,ZESTRIL) 20 MG tablet Take 1 tablet (20 mg total) by mouth daily.  . meloxicam (MOBIC) 15 MG tablet Take 1 tablet by mouth once a day as needed  . OVER THE COUNTER MEDICATION Tumeric 1 cap twice daily.  Marland Kitchen triamcinolone cream (KENALOG) 0.5 % Apply 1 application topically 2 (two) times daily. To affected areas.   No facility-administered encounter medications on file as of 12/20/2015.     Allergies (verified) Patient has no known allergies.   History: Past Medical History:  Diagnosis Date  . ARTHRITIS 08/01/2008  . HYPERLIPIDEMIA 11/21/2008  . HYPERTENSION 08/01/2008  . OSTEOARTHRITIS 08/01/2008   Past Surgical History:  Procedure Laterality Date  . HERNIA REPAIR  1979  . SPINE SURGERY  1999   disk   Family History  Problem Relation Age of Onset  . Heart disease Mother   . Heart attack Mother   . Hypertension Father   . Stroke Father   . Leukemia Other   . Stroke Other   . Alcohol abuse Other   . Arthritis Other   . Hypertension Other   . Cancer Paternal Uncle 25    prostate cancer   Social History   Occupational History  . Not on file.   Social History Main Topics  . Smoking status: Never Smoker  . Smokeless tobacco: Not on file  . Alcohol use No  . Drug use: Unknown  . Sexual activity: Not on file   Tobacco Counseling Counseling given: Yes   Activities of Daily Living In your present state of health, do you have any difficulty performing the following activities: 12/20/2015  Hearing? N  Vision? N  Difficulty concentrating  or making decisions? N  Walking or climbing stairs? N  Dressing or bathing? N  Doing errands, shopping? N  Preparing Food and eating ? N  Using the Toilet? N  In the past six months, have you accidently leaked urine? N  Do you have problems with loss of bowel control? N  Managing your Medications? N  Managing your Finances? N  Housekeeping or managing your Housekeeping? N  Some recent data might be hidden    Immunizations and Health Maintenance Immunization History  Administered Date(s) Administered  . Influenza, High Dose Seasonal PF 10/18/2015  . Pneumococcal Polysaccharide-23 02/11/2012  . Td 09/18/2001, 01/14/2006   Health Maintenance Due  Topic Date Due  . Hepatitis C Screening  [birth year]    Patient Care Team: Eulas Post, MD  as PCP - General  Indicate any recent Medical Services you may have received from other than [facility] providers in the past year (date may be approximate).    Assessment:   This is a routine wellness examination for Laiken.   Hearing/Vision screen/   Hearing Screening   125Hz  250Hz  500Hz  1000Hz  2000Hz  3000Hz  4000Hz  6000Hz  8000Hz   Right ear:       100    Left ear:       100    Vision Screening Comments: Dr. Marica Otter / no issues  Vision completed by Dr. In October 2017  Dietary issues and exercise activities discussed: Current Exercise Habits: Home exercise routine, Time (Minutes): 60, Frequency (Times/Week): 5, Weekly Exercise (Minutes/Week): 300, Intensity: Moderate  Goals    . patient          Stay in shape, fup pool work or other of your choice       Depression Screen PHQ 2/9 Scores 12/20/2015 06/24/2014 06/24/2014 04/05/2013  PHQ - 2 Score 0 0 0 0    Fall Risk Fall Risk  12/20/2015 06/24/2014 06/24/2014 04/05/2013 02/25/2013  Falls in the past year? No No No No No    Cognitive Function: MMSE - Mini Mental State Exam 12/20/2015  Not completed: (No Data)    Ad8 score 0 ; no issues; alert and plays word games with friends     Screening Tests Health Maintenance  Topic Date Due  . Hepatitis C Screening  08-05-1945  . ZOSTAVAX  07/02/2017 (Originally 11/23/2006)  . PNA vac Low Risk Adult (2 of 2 - PCV13) 07/02/2017 (Originally 02/10/2013)  . COLONOSCOPY  06/24/2019 (Originally 11/22/1996)  . TETANUS/TDAP  01/15/2016  . INFLUENZA VACCINE  Completed   Agreed to have Hep c at next blood draw; labs generally drawn in June. Hep c postponed to June but order placed      Plan:   PCP Notes  Health Maintenance Declined tdap but educated on need for one with pertussis Declined prevnar but may consider next year May also consider shingles next year  Abnormal Screens / none  Patient concerns; patient noted occasional fast heart rate not associated with dizziness or other symptoms. Educated on PVC vs atrial fib and the need to come into the office if this occurs again as Dr. Elease Hashimoto may decide to run a few test. The patient agreed to fup as directed;   Nurse Concerns;   Next PCP apt TBS or fup as needed.     During the course of the visit Jatin was educated and counseled about the following appropriate screening and preventive services:   Vaccines to include Pneumoccal, Influenza, Hepatitis B, Td, Zostavax, HCV  Electrocardiogram  Colorectal cancer screening cologuard completed eng  Cardiovascular disease screening/ bp medically managed   Chol levels reveiwed  Diabetes screening/ neg  Glaucoma screening/neg Oct 2017 with Dr. Marica Otter  Nutrition counseling/ adequate  Prostate cancer screening- deferred   Smoking cessation counseling - n/a   Patient Instructions (the written plan) were given to the patient.   [id number], RN   12/20/2015   Above notes reviewed.  Agree with assessment and recommendations as per Wynetta Fines, RN  Eulas Post MD [facility]

## 2015-12-20 ENCOUNTER — Ambulatory Visit (INDEPENDENT_AMBULATORY_CARE_PROVIDER_SITE_OTHER): Payer: PPO

## 2015-12-20 VITALS — BP 138/80 | HR 72 | Ht 73.0 in | Wt 189.0 lb

## 2015-12-20 DIAGNOSIS — Z0000 Encounter for general adult medical examination without abnormal findings: Secondary | ICD-10-CM

## 2015-12-20 DIAGNOSIS — Z7289 Other problems related to lifestyle: Secondary | ICD-10-CM

## 2015-12-20 NOTE — Patient Instructions (Addendum)
[name] , Thank you for taking time to come for your Medicare Wellness Visit. I appreciate your ongoing commitment to your health goals. Please review the following plan we discussed and let me know if I can assist you in the future.   Will consider shingles but check benefit for 2018 for Part B vs Part D   Will take hep c at next blood draw  If you take a tetanus vaccine, you will need a Tdap; the tetanus with pertussis    These are the goals we discussed: Goals    . patient          Stay in shape, fup pool work or other of your choice        This is a list of the screening recommended for you and due dates:  Health Maintenance  Topic Date Due  .  Hepatitis C: One time screening is recommended by Center for Disease Control  (CDC) for  adults born from [birth year] through 1964.   Apr 13, 1945  . Shingles Vaccine  07/02/2017*  . Pneumonia vaccines (2 of 2 - PCV13) 07/02/2017*  . Colon Cancer Screening  06/24/2019*  . Tetanus Vaccine  01/15/2016  . Flu Shot  Completed  *Topic was postponed. The date shown is not the original due date.     Fall Prevention in the Home Introduction Falls can cause injuries. They can happen to people of all ages. There are many things you can do to make your home safe and to help prevent falls. What can I do on the outside of my home?  Regularly fix the edges of walkways and driveways and fix any cracks.  Remove anything that might make you trip as you walk through a door, such as a raised step or threshold.  Trim any bushes or trees on the path to your home.  Use bright outdoor lighting.  Clear any walking paths of anything that might make someone trip, such as rocks or tools.  Regularly check to see if handrails are loose or broken. Make sure that both sides of any steps have handrails.  Any raised decks and porches should have guardrails on the edges.  Have any leaves, snow, or ice cleared regularly.  Use sand or salt on walking paths  during winter.  Clean up any spills in your garage right away. This includes oil or grease spills. What can I do in the bathroom?  Use night lights.  Install grab bars by the toilet and in the tub and shower. Do not use towel bars as grab bars.  Use non-skid mats or decals in the tub or shower.  If you need to sit down in the shower, use a plastic, non-slip stool.  Keep the floor dry. Clean up any water that spills on the floor as soon as it happens.  Remove soap buildup in the tub or shower regularly.  Attach bath mats securely with double-sided non-slip rug tape.  Do not have throw rugs and other things on the floor that can make you trip. What can I do in the bedroom?  Use night lights.  Make sure that you have a light by your bed that is easy to reach.  Do not use any sheets or blankets that are too big for your bed. They should not hang down onto the floor.  Have a firm chair that has side arms. You can use this for support while you get dressed.  Do not have throw rugs and  other things on the floor that can make you trip. What can I do in the kitchen?  Clean up any spills right away.  Avoid walking on wet floors.  Keep items that you use a lot in easy-to-reach places.  If you need to reach something above you, use a strong step stool that has a grab bar.  Keep electrical cords out of the way.  Do not use floor polish or wax that makes floors slippery. If you must use wax, use non-skid floor wax.  Do not have throw rugs and other things on the floor that can make you trip. What can I do with my stairs?  Do not leave any items on the stairs.  Make sure that there are handrails on both sides of the stairs and use them. Fix handrails that are broken or loose. Make sure that handrails are as long as the stairways.  Check any carpeting to make sure that it is firmly attached to the stairs. Fix any carpet that is loose or worn.  Avoid having throw rugs at the top  or bottom of the stairs. If you do have throw rugs, attach them to the floor with carpet tape.  Make sure that you have a light switch at the top of the stairs and the bottom of the stairs. If you do not have them, ask someone to add them for you. What else can I do to help prevent falls?  Wear shoes that:  Do not have high heels.  Have rubber bottoms.  Are comfortable and fit you well.  Are closed at the toe. Do not wear sandals.  If you use a stepladder:  Make sure that it is fully opened. Do not climb a closed stepladder.  Make sure that both sides of the stepladder are locked into place.  Ask someone to hold it for you, if possible.  Clearly mark and make sure that you can see:  Any grab bars or handrails.  First and last steps.  Where the edge of each step is.  Use tools that help you move around (mobility aids) if they are needed. These include:  Canes.  Walkers.  Scooters.  Crutches.  Turn on the lights when you go into a dark area. Replace any light bulbs as soon as they burn out.  Set up your furniture so you have a clear path. Avoid moving your furniture around.  If any of your floors are uneven, fix them.  If there are any pets around you, be aware of where they are.  Review your medicines with your doctor. Some medicines can make you feel dizzy. This can increase your chance of falling. Ask your doctor what other things that you can do to help prevent falls. This information is not intended to replace advice given to you by your health care provider. Make sure you discuss any questions you have with your health care provider. Document Released: 10/27/2008 Document Revised: 06/08/2015 Document Reviewed: 02/04/2014  2017 Elsevier  Health Maintenance, Male A healthy lifestyle and preventative care can promote health and wellness.  Maintain regular health, dental, and eye exams.  Eat a healthy diet. Foods like vegetables, fruits, whole grains,  low-fat dairy products, and lean protein foods contain the nutrients you need and are low in calories. Decrease your intake of foods high in solid fats, added sugars, and salt. Get information about a proper diet from your health care provider, if necessary.  Regular physical exercise is one of the  most important things you can do for your health. Most adults should get at least 150 minutes of moderate-intensity exercise (any activity that increases your heart rate and causes you to sweat) each week. In addition, most adults need muscle-strengthening exercises on 2 or more days a week.   Maintain a healthy weight. The body mass index (BMI) is a screening tool to identify possible weight problems. It provides an estimate of body fat based on height and weight. Your health care provider can find your BMI and can help you achieve or maintain a healthy weight. For males 20 years and older:  A BMI below 18.5 is considered underweight.  A BMI of 18.5 to 24.9 is normal.  A BMI of 25 to 29.9 is considered overweight.  A BMI of 30 and above is considered obese.  Maintain normal blood lipids and cholesterol by exercising and minimizing your intake of saturated fat. Eat a balanced diet with plenty of fruits and vegetables. Blood tests for lipids and cholesterol should begin at age 9 and be repeated every 5 years. If your lipid or cholesterol levels are high, you are over age 40, or you are at high risk for heart disease, you may need your cholesterol levels checked more frequently.Ongoing high lipid and cholesterol levels should be treated with medicines if diet and exercise are not working.  If you smoke, find out from your health care provider how to quit. If you do not use tobacco, do not start.  Lung cancer screening is recommended for adults aged 64-80 years who are at high risk for developing lung cancer because of a history of smoking. A yearly low-dose CT scan of the lungs is recommended for people  who have at least a 30-pack-year history of smoking and are current smokers or have quit within the past 15 years. A pack year of smoking is smoking an average of 1 pack of cigarettes a day for 1 year (for example, a 30-pack-year history of smoking could mean smoking 1 pack a day for 30 years or 2 packs a day for 15 years). Yearly screening should continue until the smoker has stopped smoking for at least 15 years. Yearly screening should be stopped for people who develop a health problem that would prevent them from having lung cancer treatment.  If you choose to drink alcohol, do not have more than 2 drinks per day. One drink is considered to be 12 oz (360 mL) of beer, 5 oz (150 mL) of wine, or 1.5 oz (45 mL) of liquor.  Avoid the use of street drugs. Do not share needles with anyone. Ask for help if you need support or instructions about stopping the use of drugs.  High blood pressure causes heart disease and increases the risk of stroke. High blood pressure is more likely to develop in:  People who have blood pressure in the end of the normal range (100-139/85-89 mm Hg).  People who are overweight or obese.  People who are African American.  If you are 40-61 years of age, have your blood pressure checked every 3-5 years. If you are 35 years of age or older, have your blood pressure checked every year. You should have your blood pressure measured twice-once when you are at a hospital or clinic, and once when you are not at a hospital or clinic. Record the average of the two measurements. To check your blood pressure when you are not at a hospital or clinic, you can use:  An  automated blood pressure machine at a pharmacy.  A home blood pressure monitor.  If you are 36-68 years old, ask your health care provider if you should take aspirin to prevent heart disease.  Diabetes screening involves taking a blood sample to check your fasting blood sugar level. This should be done once every 3 years  after age 17 if you are at a normal weight and without risk factors for diabetes. Testing should be considered at a younger age or be carried out more frequently if you are overweight and have at least 1 risk factor for diabetes.  Colorectal cancer can be detected and often prevented. Most routine colorectal cancer screening begins at the age of 47 and continues through age 26. However, your health care provider may recommend screening at an earlier age if you have risk factors for colon cancer. On a yearly basis, your health care provider may provide home test kits to check for hidden blood in the stool. A small camera at the end of a tube may be used to directly examine the colon (sigmoidoscopy or colonoscopy) to detect the earliest forms of colorectal cancer. Talk to your health care provider about this at age 10 when routine screening begins. A direct exam of the colon should be repeated every 5-10 years through age 17, unless early forms of precancerous polyps or small growths are found.  People who are at an increased risk for hepatitis B should be screened for this virus. You are considered at high risk for hepatitis B if:  You were born in a country where hepatitis B occurs often. Talk with your health care provider about which countries are considered high risk.  Your parents were born in a high-risk country and you have not received a shot to protect against hepatitis B (hepatitis B vaccine).  You have HIV or AIDS.  You use needles to inject street drugs.  You live with, or have sex with, someone who has hepatitis B.  You are a man who has sex with other men (MSM).  You get hemodialysis treatment.  You take certain medicines for conditions like cancer, organ transplantation, and autoimmune conditions.  Hepatitis C blood testing is recommended for all people born from [birth year] through 1965 and any individual with known risk factors for hepatitis C.  Healthy men should no longer receive  prostate-specific antigen (PSA) blood tests as part of routine cancer screening. Talk to your health care provider about prostate cancer screening.  Testicular cancer screening is not recommended for adolescents or adult males who have no symptoms. Screening includes self-exam, a health care provider exam, and other screening tests. Consult with your health care provider about any symptoms you have or any concerns you have about testicular cancer.  Practice safe sex. Use condoms and avoid high-risk sexual practices to reduce the spread of sexually transmitted infections (STIs).  You should be screened for STIs, including gonorrhea and chlamydia if:  You are sexually active and are younger than 24 years.  You are older than 24 years, and your health care provider tells you that you are at risk for this type of infection.  Your sexual activity has changed since you were last screened, and you are at an increased risk for chlamydia or gonorrhea. Ask your health care provider if you are at risk.  If you are at risk of being infected with HIV, it is recommended that you take a prescription medicine daily to prevent HIV infection. This is called pre-exposure prophylaxis (  PrEP). You are considered at risk if:  You are a man who has sex with other men (MSM).  You are a heterosexual man who is sexually active with multiple partners.  You take drugs by injection.  You are sexually active with a partner who has HIV.  Talk with your health care provider about whether you are at high risk of being infected with HIV. If you choose to begin PrEP, you should first be tested for HIV. You should then be tested every 3 months for as long as you are taking PrEP.  Use sunscreen. Apply sunscreen liberally and repeatedly throughout the day. You should seek shade when your shadow is shorter than you. Protect yourself by wearing long sleeves, pants, a wide-brimmed hat, and sunglasses year round whenever you are  outdoors.  Tell your health care provider of new moles or changes in moles, especially if there is a change in shape or color. Also, tell your health care provider if a mole is larger than the size of a pencil eraser.  A one-time screening for abdominal aortic aneurysm (AAA) and surgical repair of large AAAs by ultrasound is recommended for men aged 17-75 years who are current or former smokers.  Stay current with your vaccines (immunizations). This information is not intended to replace advice given to you by your health care provider. Make sure you discuss any questions you have with your health care provider. Document Released: 06/29/2007 Document Revised: 01/21/2014 Document Reviewed: 10/04/2014 Elsevier Interactive Patient Education  2017 [employer].

## 2015-12-25 DIAGNOSIS — M5417 Radiculopathy, lumbosacral region: Secondary | ICD-10-CM | POA: Diagnosis not present

## 2015-12-25 DIAGNOSIS — M5416 Radiculopathy, lumbar region: Secondary | ICD-10-CM | POA: Diagnosis not present

## 2015-12-25 DIAGNOSIS — I10 Essential (primary) hypertension: Secondary | ICD-10-CM | POA: Diagnosis not present

## 2016-01-03 DIAGNOSIS — D225 Melanocytic nevi of trunk: Secondary | ICD-10-CM | POA: Diagnosis not present

## 2016-01-03 DIAGNOSIS — L570 Actinic keratosis: Secondary | ICD-10-CM | POA: Diagnosis not present

## 2016-01-03 DIAGNOSIS — L814 Other melanin hyperpigmentation: Secondary | ICD-10-CM | POA: Diagnosis not present

## 2016-01-03 DIAGNOSIS — L821 Other seborrheic keratosis: Secondary | ICD-10-CM | POA: Diagnosis not present

## 2016-01-03 DIAGNOSIS — D1801 Hemangioma of skin and subcutaneous tissue: Secondary | ICD-10-CM | POA: Diagnosis not present

## 2016-04-09 NOTE — Progress Notes (Signed)
[facility] [address], [place] [address] Phone: [phone] Subjective:    IZ:TIWP shoulder pain follow-up  [id number]  Bryce Wood is a 71 y.o. male coming in with complaint of left shoulder pain.  Patient does have arthritis of the left shoulder. Last seen 5 months ago and was given an injection. Patient was doing very well until last month. Started having increasing pain again. Starting to affect his daily activities. Can wake him up at night. Worsening symptoms. No new symptoms.  Patient is also complaining of intermittent right ankle pain. States that there sometimes feels to be more of a burning sensation or something biting him on the lateral aspect of the ankle. This is happen 2 or 3 times and only last seconds. One time at night and one time when walking on a regular basis.  Past Medical History:  Diagnosis Date  . ARTHRITIS 08/01/2008  . HYPERLIPIDEMIA 11/21/2008  . HYPERTENSION 08/01/2008  . OSTEOARTHRITIS 08/01/2008   Past Surgical History:  Procedure Laterality Date  . HERNIA REPAIR  1979  . SPINE SURGERY  1999   disk   Social History  Substance Use Topics  . Smoking status: Never Smoker  . Smokeless tobacco: Not on file  . Alcohol use No   No Known Allergies no known drug allergies Family History  Problem Relation Age of Onset  . Heart disease Mother   . Heart attack Mother   . Hypertension Father   . Stroke Father   . Leukemia Other   . Stroke Other   . Alcohol abuse Other   . Arthritis Other   . Hypertension Other   . Cancer Paternal Uncle 41    prostate cancer        Past medical history, social, surgical and family history all reviewed in electronic medical record.   Review of Systems: No headache, visual changes, nausea, vomiting, diarrhea, constipation, dizziness, abdominal pain, skin rash, fevers, chills, night sweats, weight loss, swollen lymph nodes, body aches, joint swelling, muscle aches, chest  pain, shortness of breath, mood changes.    Objective  Blood pressure (!) 160/92, pulse 72, height 6\' 1"  (1.854 m), weight 193 lb (87.5 kg).  General: No apparent distress alert and oriented x3 mood and affect normal, dressed appropriately. Mild masked feces noted today. HEENT: Pupils equal, extraocular movements intact  Respiratory: Patient's speak in full sentences and does not appear short of breath  Cardiovascular: No lower extremity edema, non tender, no erythema patient does have severe varicose veins of the legs bilaterally left greater than right does have some decreased pulses on the right leg compared to that left leg Skin: Warm dry intact with no signs of infection or rash on extremities or on axial skeleton.  Abdomen: Soft nontender  Neuro: Cranial nerves II through XII are intact, neurovascularly intact in all extremities with 2+ DTRs Lymph: No lymphadenopathy of posterior or anterior cervical chain or axillae bilaterally.  Gait mild shuffling gait  MSK:  Non tender with full range of motion and good stability and symmetric strength and tone of , elbows, wrist, hip, and ankles bilaterally. Mild worsening of the right upper extremity resting tremor    Shoulder: left Inspection reveals no abnormalities, atrophy or asymmetry. Palpation is normal with no tenderness over AC joint or bicipital groove. ROM is full in all planes passively. Rotator cuff strength normal throughout. signs of impingement with positive Neer and Hawkin's tests, but negative empty can sign. Speeds and  Yergason's tests normal. No labral pathology noted with negative Obrien's, negative clunk and good stability. Normal scapular function observed. No painful arc and no drop arm sign. No apprehension sign  Severe pes planus noted of the foot. Patient does have a bluish hugh to the foot compared to the contralateral side  Procedure: Real-time Ultrasound Guided Injection of left glenohumeral joint Device: GE  Logiq E  Ultrasound guided injection is preferred based studies that show increased duration, increased effect, greater accuracy, decreased procedural pain, increased response rate with ultrasound guided versus blind injection.  Verbal informed consent obtained.  Time-out conducted.  Noted no overlying erythema, induration, or other signs of local infection.  Skin prepped in a sterile fashion.  Local anesthesia: Topical Ethyl chloride.  With sterile technique and under real time ultrasound guidance:  Joint visualized.  23g 1  inch needle inserted posterior approach. Pictures taken for needle placement. Patient did have injection of 2 cc of 1% lidocaine, 2 cc of 0.5% Marcaine, and 1.0 cc of Kenalog 40 mg/dL. Completed without difficulty  Pain immediately resolved suggesting accurate placement of the medication.  Advised to call if fevers/chills, erythema, induration, drainage, or persistent bleeding.  Images permanently stored and available for review in the ultrasound unit.  Impression: Technically successful ultrasound guided injection.    Impression and Recommendations:

## 2016-04-10 ENCOUNTER — Ambulatory Visit (INDEPENDENT_AMBULATORY_CARE_PROVIDER_SITE_OTHER): Payer: PPO | Admitting: Family Medicine

## 2016-04-10 ENCOUNTER — Ambulatory Visit: Payer: Self-pay

## 2016-04-10 VITALS — BP 160/92 | HR 72 | Ht 73.0 in | Wt 193.0 lb

## 2016-04-10 DIAGNOSIS — M19012 Primary osteoarthritis, left shoulder: Secondary | ICD-10-CM

## 2016-04-10 DIAGNOSIS — M25512 Pain in left shoulder: Secondary | ICD-10-CM

## 2016-04-10 DIAGNOSIS — M79669 Pain in unspecified lower leg: Secondary | ICD-10-CM

## 2016-04-10 DIAGNOSIS — M2141 Flat foot [pes planus] (acquired), right foot: Secondary | ICD-10-CM

## 2016-04-10 DIAGNOSIS — M2142 Flat foot [pes planus] (acquired), left foot: Secondary | ICD-10-CM | POA: Diagnosis not present

## 2016-04-10 NOTE — Assessment & Plan Note (Signed)
Discussed over-the-counter orthotics, discussed over-the-counter medications to help. Avoiding being barefoot. I do believe that there is some preventive peripheral vascular disease a could be contributing and ABI is ordered.

## 2016-04-10 NOTE — Assessment & Plan Note (Signed)
Patient given injection today. Tolerated procedure well. Has had numerous injections previously but seems to respond fairly well. I do not think that further imaging is necessary at this time. Patient will come back and see me again 4-6 weeks.

## 2016-04-10 NOTE — Patient Instructions (Signed)
Good to see you  Ice 20 minutes 2 times daily. Usually after activity and before bed. Keep the hands within peripheral vision.  Iron 65mg  daily with 500mg  of vitamin C may help some of the discomfort in the leg See me again when you need me.

## 2016-04-12 ENCOUNTER — Other Ambulatory Visit: Payer: Self-pay | Admitting: Family Medicine

## 2016-04-12 DIAGNOSIS — I739 Peripheral vascular disease, unspecified: Secondary | ICD-10-CM

## 2016-04-12 DIAGNOSIS — M79669 Pain in unspecified lower leg: Secondary | ICD-10-CM

## 2016-04-25 ENCOUNTER — Ambulatory Visit (HOSPITAL_COMMUNITY)
Admission: RE | Admit: 2016-04-25 | Discharge: 2016-04-25 | Disposition: A | Discharge status: Home / Self Care | Payer: PPO | Source: Ambulatory Visit | Attending: Cardiovascular Disease | Admitting: Cardiovascular Disease

## 2016-04-25 DIAGNOSIS — I739 Peripheral vascular disease, unspecified: Secondary | ICD-10-CM

## 2016-04-25 DIAGNOSIS — M79669 Pain in unspecified lower leg: Secondary | ICD-10-CM | POA: Diagnosis not present

## 2016-07-11 DIAGNOSIS — Z85828 Personal history of other malignant neoplasm of skin: Secondary | ICD-10-CM | POA: Diagnosis not present

## 2016-07-11 DIAGNOSIS — L570 Actinic keratosis: Secondary | ICD-10-CM | POA: Diagnosis not present

## 2016-07-11 DIAGNOSIS — L905 Scar conditions and fibrosis of skin: Secondary | ICD-10-CM | POA: Diagnosis not present

## 2016-08-16 ENCOUNTER — Other Ambulatory Visit: Payer: Self-pay | Admitting: Family Medicine

## 2016-09-04 ENCOUNTER — Ambulatory Visit (INDEPENDENT_AMBULATORY_CARE_PROVIDER_SITE_OTHER): Payer: PPO | Admitting: Family Medicine

## 2016-09-04 ENCOUNTER — Encounter: Payer: Self-pay | Admitting: Family Medicine

## 2016-09-04 ENCOUNTER — Encounter: Payer: Self-pay | Admitting: Neurology

## 2016-09-04 VITALS — BP 140/80 | HR 78 | Temp 97.9°F | Ht 72.0 in | Wt 187.5 lb

## 2016-09-04 DIAGNOSIS — R251 Tremor, unspecified: Secondary | ICD-10-CM | POA: Diagnosis not present

## 2016-09-04 DIAGNOSIS — I10 Essential (primary) hypertension: Secondary | ICD-10-CM | POA: Diagnosis not present

## 2016-09-04 DIAGNOSIS — Z0000 Encounter for general adult medical examination without abnormal findings: Secondary | ICD-10-CM

## 2016-09-04 DIAGNOSIS — Z7289 Other problems related to lifestyle: Secondary | ICD-10-CM

## 2016-09-04 DIAGNOSIS — Z125 Encounter for screening for malignant neoplasm of prostate: Secondary | ICD-10-CM

## 2016-09-04 DIAGNOSIS — Z0001 Encounter for general adult medical examination with abnormal findings: Secondary | ICD-10-CM | POA: Diagnosis not present

## 2016-09-04 LAB — CBC WITH DIFFERENTIAL/PLATELET
BASOS PCT: 0.4 % (ref 0.0–3.0)
Basophils Absolute: 0 10*3/uL (ref 0.0–0.1)
EOS ABS: 0 10*3/uL (ref 0.0–0.7)
Eosinophils Relative: 1 % (ref 0.0–5.0)
HCT: 43.3 % (ref 39.0–52.0)
Hemoglobin: 14.2 g/dL (ref 13.0–17.0)
LYMPHS PCT: 34.7 % (ref 12.0–46.0)
Lymphs Abs: 1.7 10*3/uL (ref 0.7–4.0)
MCHC: 32.7 g/dL (ref 30.0–36.0)
MCV: 93.8 fl (ref 78.0–100.0)
Monocytes Absolute: 0.6 10*3/uL (ref 0.1–1.0)
Monocytes Relative: 11.3 % (ref 3.0–12.0)
Neutro Abs: 2.7 10*3/uL (ref 1.4–7.7)
Neutrophils Relative %: 52.6 % (ref 43.0–77.0)
Platelets: 299 10*3/uL (ref 150.0–400.0)
RBC: 4.62 Mil/uL (ref 4.22–5.81)
RDW: 13 % (ref 11.5–15.5)
WBC: 5 10*3/uL (ref 4.0–10.5)

## 2016-09-04 LAB — HEPATIC FUNCTION PANEL
ALK PHOS: 61 U/L (ref 39–117)
ALT: 16 U/L (ref 0–53)
AST: 20 U/L (ref 0–37)
Albumin: 4.6 g/dL (ref 3.5–5.2)
BILIRUBIN DIRECT: 0.1 mg/dL (ref 0.0–0.3)
BILIRUBIN TOTAL: 0.5 mg/dL (ref 0.2–1.2)
Total Protein: 7.2 g/dL (ref 6.0–8.3)

## 2016-09-04 LAB — BASIC METABOLIC PANEL
BUN: 19 mg/dL (ref 6–23)
CHLORIDE: 104 meq/L (ref 96–112)
CO2: 29 mEq/L (ref 19–32)
CREATININE: 1.03 mg/dL (ref 0.40–1.50)
Calcium: 9.9 mg/dL (ref 8.4–10.5)
GFR: 75.93 mL/min (ref >60.00)
GLUCOSE: 104 mg/dL — AB (ref 70–99)
Potassium: 5 mEq/L (ref 3.5–5.1)
Sodium: 140 mEq/L (ref 135–145)

## 2016-09-04 LAB — LIPID PANEL
CHOLESTEROL: 199 mg/dL (ref 0–200)
HDL: 46.7 mg/dL (ref >39.00)
LDL CALC: 118 mg/dL — AB (ref 0–99)
NonHDL: 152.49
Total CHOL/HDL Ratio: 4
Triglycerides: 173 mg/dL — ABNORMAL HIGH (ref 0.0–149.0)
VLDL: 34.6 mg/dL (ref 0.0–40.0)

## 2016-09-04 LAB — TSH: TSH: 1.12 u[IU]/mL (ref 0.35–4.50)

## 2016-09-04 LAB — PSA: PSA: 0.46 ng/mL (ref 0.10–4.00)

## 2016-09-04 MED ORDER — LISINOPRIL 20 MG PO TABS: 20.0000 mg | ORAL_TABLET | Freq: Every day | ORAL | 3 refills | Status: DC

## 2016-09-04 NOTE — Progress Notes (Signed)
Subjective:     Patient ID: Bryce Wood, male   DOB: 1945/06/20, 71 y.o.   MRN: [id number]  HPI Patient seen for physical exam. His chronic problems include history of osteoarthritis, hypertension, mild hyperlipidemia. He retired from his regular full job last year and is currently working part-time at [employer]. He is playing golf frequently. He had some recent shoulder difficulties which are followed by sports medicine. He states around January of this year he developed sudden tremor involving right hand. Denies any family history of tremor. Was evaluated by sports medicine and not felt to have Parkinson's .  No history of hepatitis C screening. No history of Prevnar 13 and he is due for tetanus booster. He declines both of those vaccines today.  Hypertension treated with lisinopril 20 mg daily. Compliant with therapy. No recent chest pains. No headaches. No dizziness.  Past Medical History:  Diagnosis Date  . ARTHRITIS 08/01/2008  . HYPERLIPIDEMIA 11/21/2008  . HYPERTENSION 08/01/2008  . OSTEOARTHRITIS 08/01/2008   Past Surgical History:  Procedure Laterality Date  . HERNIA REPAIR  1979  . SPINE SURGERY  1999   disk    reports that he has never smoked. He has never used smokeless tobacco. He reports that he does not drink alcohol. His drug history is not on file. family history includes Alcohol abuse in his other; Arthritis in his other; Cancer (age of onset: 51) in his paternal uncle; Heart attack in his mother; Heart disease in his mother; Hypertension in his father and other; Leukemia in his other; Stroke in his father and other. No Known Allergies   Review of Systems  Constitutional: Negative for activity change, appetite change, fatigue and fever.  HENT: Negative for congestion, ear pain and trouble swallowing.   Eyes: Negative for pain and visual disturbance.  Respiratory: Negative for cough, shortness of breath and wheezing.   Cardiovascular: Negative for chest pain and  palpitations.  Gastrointestinal: Negative for abdominal distention, abdominal pain, blood in stool, constipation, diarrhea, nausea, rectal pain and vomiting.  Genitourinary: Negative for dysuria, hematuria and testicular pain.  Musculoskeletal: Positive for arthralgias. Negative for joint swelling.  Skin: Negative for rash.  Neurological: Positive for tremors. Negative for dizziness, syncope and headaches.  Hematological: Negative for adenopathy.  Psychiatric/Behavioral: Negative for confusion and dysphoric mood.       Objective:   Physical Exam  Constitutional: He is oriented to person, place, and time. He appears well-developed and well-nourished.  HENT:  Right Ear: External ear normal.  Left Ear: External ear normal.  Mouth/Throat: Oropharynx is clear and moist.  Eyes: Pupils are equal, round, and reactive to light.  Neck: Neck supple. No thyromegaly present.  Cardiovascular: Normal rate and regular rhythm.   Pulmonary/Chest: Effort normal and breath sounds normal. No respiratory distress. He has no wheezes. He has no rales.  Musculoskeletal: He exhibits no edema.  Neurological: He is alert and oriented to person, place, and time. No cranial nerve deficit.  Patient has tremor at rest right hand which does not extinguish with movement. No rigidity. Normal gait       Assessment:     #1 Complete physical. Patient needs Prevnar 13 and tetanus booster and declines both. He had DNA based stool testing last year and has decided he would like to consider that every other year. He declines colonoscopy screening. He plans to get flu vaccine later this year  #2 sudden onset right hand tremor around January this year  #3 hypertension with marginal  control    Plan:     -Check labs including PSA per patient request -The natural history of prostate cancer and ongoing controversy regarding screening and potential treatment outcomes of prostate cancer has been discussed with the patient. The  meaning of a false positive PSA and a false negative PSA has been discussed. He indicates understanding of the limitations of this screening test and wishes to proceed with screening PSA testing. -Monitor blood pressure closely and be in touch if consistently greater than 140/90 by home readings -We recommended flu vaccination, Prevnar 13, and tetanus booster and he plans to get flu vaccine later and declines other vaccines -Refill lisinopril for one year -Set up neurology referral regarding sudden onset right hand tremor  Eulas Post MD [facility]

## 2016-09-04 NOTE — Patient Instructions (Addendum)
Monitor blood pressure and be in touch if consistently > 140/90 Remember flu vaccine this Fall. Let me know if you change your mind about the tetanus booster or Prevnar 13

## 2016-09-05 LAB — HEPATITIS C ANTIBODY: HCV AB: NONREACTIVE

## 2016-09-12 ENCOUNTER — Ambulatory Visit: Payer: PPO | Admitting: Family Medicine

## 2016-09-22 NOTE — Progress Notes (Signed)
  [name] [address] [place],  [address] Phone: [phone] Subjective:      CC: Left shoulder pain  [id number]  Tresten Pantoja is a 71 y.o. male coming in with complaint of eft shoulder pain. Patient and send this pain previously. Has some arthritic changes.Last injection was 6 months ago. Worsening symptoms, waking him up at night. States difficult to do daily activities.  Also having a tremor on the right side worsening.      Past Medical History:  Diagnosis Date  . ARTHRITIS 08/01/2008  . HYPERLIPIDEMIA 11/21/2008  . HYPERTENSION 08/01/2008  . OSTEOARTHRITIS 08/01/2008   Past Surgical History:  Procedure Laterality Date  . HERNIA REPAIR  1979  . SPINE SURGERY  1999   disk   Social History   Social History  . Marital status: Married    Spouse name: N/A  . Number of children: N/A  . Years of education: N/A   Social History Main Topics  . Smoking status: Never Smoker  . Smokeless tobacco: Never Used  . Alcohol use No  . Drug use: Unknown  . Sexual activity: Not Asked   Other Topics Concern  . None   Social History Narrative  . None   No Known Allergies Family History  Problem Relation Age of Onset  . Heart disease Mother   . Heart attack Mother   . Hypertension Father   . Stroke Father   . Leukemia Other   . Stroke Other   . Alcohol abuse Other   . Arthritis Other   . Hypertension Other   . Cancer Paternal Uncle 64       prostate cancer     Past medical history, social, surgical and family history all reviewed in electronic medical record.  No pertanent information unless stated regarding to the chief complaint.   Review of Systems:Review of systems updated and as accurate as of 09/23/16  No headache, visual changes, nausea, vomiting, diarrhea, constipation, dizziness, abdominal pain, skin rash, fevers, chills, night sweats, weight loss, swollen lymph nodes, body aches, joint swelling, muscle aches, chest  pain, shortness of breath, mood changes.   Objective  Blood pressure 118/80, pulse (!) 57, height 6' (1.829 m), weight 187 lb (84.8 kg), SpO2 98 %. Systems examined below as of 09/23/16   General: No apparent distress alert and oriented x3 mood and affect normal, dressed appropriately. Tremor right UE.  HEENT: Pupils equal, extraocular movements intact  Respiratory: Patient's speak in full sentences and does not appear short of breath  Cardiovascular: No lower extremity edema, non tender, no erythema  Skin: Warm dry intact with no signs of infection or rash on extremities or on axial skeleton.  Abdomen: Soft nontender  Neuro: Cranial nerves II through XII are intact, neurovascularly intact in all extremities with 2+ DTRs and 2+ pulses.  Lymph: No lymphadenopathy of posterior or anterior cervical chain or axillae bilaterally.  Gait mild shuffling.  MSK:  Non tender with full range of motion and good stability and symmetric strength and tone of  elbows, wrist, hip, knee and ankles bilaterally. Right upper extremity tremor, rigidity.     Impression and Recommendations:     This case required medical decision making of moderate complexity.      Note: This dictation was prepared with Dragon dictation along with smaller phrase technology. Any transcriptional errors that result from this process are unintentional.

## 2016-09-23 ENCOUNTER — Ambulatory Visit: Payer: Self-pay

## 2016-09-23 ENCOUNTER — Encounter: Payer: Self-pay | Admitting: Family Medicine

## 2016-09-23 ENCOUNTER — Ambulatory Visit (INDEPENDENT_AMBULATORY_CARE_PROVIDER_SITE_OTHER): Payer: PPO | Admitting: Family Medicine

## 2016-09-23 VITALS — BP 118/80 | HR 57 | Ht 72.0 in | Wt 187.0 lb

## 2016-09-23 DIAGNOSIS — G8929 Other chronic pain: Secondary | ICD-10-CM

## 2016-09-23 DIAGNOSIS — M25512 Pain in left shoulder: Secondary | ICD-10-CM

## 2016-09-23 DIAGNOSIS — M19012 Primary osteoarthritis, left shoulder: Secondary | ICD-10-CM

## 2016-09-23 DIAGNOSIS — G250 Essential tremor: Secondary | ICD-10-CM | POA: Diagnosis not present

## 2016-09-23 NOTE — Assessment & Plan Note (Signed)
Injected again.  worsenign pain.  Discussed topical NSAIDs COnitnue HEP Patient declined referral to PT and declined any surgical intervention RTC in 10 weeks.

## 2016-09-23 NOTE — Assessment & Plan Note (Signed)
Worsening, concern for parkinson,  Seeing neurology

## 2016-09-23 NOTE — Patient Instructions (Addendum)
Good to see you  Injected the shoulder again.  See Dr. Carles Collet We can repeat injections every 3 months if needed See me again when you need me.

## 2016-09-26 NOTE — Progress Notes (Signed)
Bryce Wood was seen today in the movement disorders clinic for neurologic consultation at the request of Eulas Post, MD.  This patient is accompanied in the office by his spouse who supplements the history.  The consultation is for the evaluation of R hand tremor x 1 year.  The records that were made available to me were reviewed.     Tremor: Yes.     At rest or with activation?  Rest, with arm hanging straight down  Fam hx of tremor?  No.  Located where?  R hand   Affected by caffeine:  Yes.  , but has cut back on caffeine now (drinks 1 coke per day)  Affected by alcohol:  Doesn't drink  Affected by stress:  Yes.    Affected by fatigue:  Yes.    Spills soup if on spoon:  No.  Spills glass of liquid if full:  No.  Affects ADL's (tying shoes, brushing teeth, etc):  No.  Other symptoms: Voice: no change Sleep: sleep well  Vivid Dreams:  Yes.    Acting out dreams:  No. Wet Pillows: No. Postural symptoms:  No.  Falls?  No. Bradykinesia symptoms: pushes self off low chair/couch/car Loss of smell:  No. Loss of taste:  No. Urinary Incontinence:  No. Difficulty Swallowing:  No. Handwriting, micrographia: Yes.   Trouble with ADL's:  No.  Trouble buttoning clothing: No. Depression:  No. Memory changes:  No. Hallucinations:  No.  visual distortions: No. N/V:  No. Lightheaded:  No.  Syncope: No. Diplopia:  No. Dyskinesia:  No.  Neuroimaging of the brain has not previously been performed.   PREVIOUS MEDICATIONS: none to date  ALLERGIES:  No Known Allergies  CURRENT MEDICATIONS:  Outpatient Encounter Prescriptions as of 10/03/2016  Medication Sig  . acetaminophen (TYLENOL) 650 MG CR tablet Take 650 mg by mouth 3 (three) times daily.  . cholecalciferol (VITAMIN D) 1000 UNITS tablet Take 2,000 Units by mouth daily.  . Diclofenac Sodium 2 % SOLN Apply 1 pump twice daily as needed.  . fish oil-omega-3 fatty acids 1000 MG capsule Take 1 g by mouth daily.   Marland Kitchen  lisinopril (PRINIVIL,ZESTRIL) 20 MG tablet Take 1 tablet (20 mg total) by mouth daily.  . meloxicam (MOBIC) 15 MG tablet Take 1 tablet by mouth once a day as needed  . triamcinolone cream (KENALOG) 0.5 % Apply 1 application topically 2 (two) times daily. To affected areas.  . TURMERIC PO Take by mouth.  . [DISCONTINUED] OVER THE COUNTER MEDICATION Tumeric 1 cap twice daily.   No facility-administered encounter medications on file as of 10/03/2016.     PAST MEDICAL HISTORY:   Past Medical History:  Diagnosis Date  . ARTHRITIS 08/01/2008  . HYPERLIPIDEMIA 11/21/2008  . HYPERTENSION 08/01/2008  . OSTEOARTHRITIS 08/01/2008    PAST SURGICAL HISTORY:   Past Surgical History:  Procedure Laterality Date  . HERNIA REPAIR  1979   inguinal  . SPINE SURGERY  1999   disk    SOCIAL HISTORY:   Social History   Social History  . Marital status: Married    Spouse name: N/A  . Number of children: N/A  . Years of education: N/A   Occupational History  . retired     Lawyer   Social History Main Topics  . Smoking status: Never Smoker  . Smokeless tobacco: Never Used  . Alcohol use No  . Drug use: No  . Sexual activity: Not on file  Other Topics Concern  . Not on file   Social History Narrative  . No narrative on file    FAMILY HISTORY:   Family Status  Relation Status  . Mother Deceased  . Father Deceased  . Daughter Alive  . Annamarie Major (Not Specified)  . Brother Alive    ROS:  A complete 10 system review of systems was obtained and was unremarkable apart from what is mentioned above.  PHYSICAL EXAMINATION:    VITALS:   Vitals:   10/03/16 0834  BP: 140/84  Pulse: 64  Weight: 184 lb (83.5 kg)  Height: 6' (1.829 m)    GEN:  The patient appears stated age and is in NAD. HEENT:  Normocephalic, atraumatic.  The mucous membranes are moist. The superficial temporal arteries are without ropiness or tenderness. CV:  RRR Lungs:  CTAB Neck/HEME:  There are no  carotid bruits bilaterally.  Neurological examination:  Orientation: The patient is alert and oriented x3. Fund of knowledge is appropriate.  Recent and remote memory are intact.  Attention and concentration are normal.    Able to name objects and repeat phrases. Cranial nerves: There is good facial symmetry. There is facial hypomimia.  Pupils are equal round and reactive to light bilaterally. Fundoscopic exam reveals clear margins bilaterally. Extraocular muscles are intact. The visual fields are full to confrontational testing. The speech is fluent and clear. Soft palate rises symmetrically and there is no tongue deviation. Hearing is intact to conversational tone. Sensation: Sensation is intact to light throughout (facial, trunk, extremities). Vibration is intact at the bilateral big toe. There is no extinction with double simultaneous stimulation. There is no sensory dermatomal level identified. Motor: Strength is 5/5 in the bilateral upper and lower extremities.   Shoulder shrug is equal and symmetric.  There is no pronator drift. Deep tendon reflexes: Deep tendon reflexes are 2/4 at the bilateral biceps, triceps, brachioradialis, patella and achilles. Plantar responses are downgoing bilaterally.  Movement examination: Tone: There is mod increased tone in the RUE/RLE.  Tone on the L is normal Abnormal movements: There is a RUE resting tremor that increases with distraction Coordination:  There is decremation with RAM's, with any form of RAMS, including alternating supination and pronation of the forearm, hand opening and closing, finger taps, heel taps and toe taps on the R.  RAMs on the L are normal.   Gait and Station: The patient has minimal difficulty arising out of a deep-seated chair without the use of the hands. The patient's stride length is normal with stooped posture and decreased arm swing on the right.  There is re-emergent tremor on the right.  The patient has a negative pull test.       Lab Results  Component Value Date   TSH 1.12 09/04/2016     Chemistry      Component Value Date/Time   NA 140 09/04/2016 0947   K 5.0 09/04/2016 0947   CL 104 09/04/2016 0947   CO2 29 09/04/2016 0947   BUN 19 09/04/2016 0947   CREATININE 1.03 09/04/2016 0947      Component Value Date/Time   CALCIUM 9.9 09/04/2016 0947   ALKPHOS 61 09/04/2016 0947   AST 20 09/04/2016 0947   ALT 16 09/04/2016 0947   BILITOT 0.5 09/04/2016 0947       ASSESSMENT/PLAN:  1.  Idiopathic Parkinson's disease.  The patient has tremor, bradykinesia, rigidity and mild postural instability.  New dx:  10/03/16  -We discussed the diagnosis  as well as pathophysiology of the disease.  We discussed treatment options as well as prognostic indicators.  Patient education was provided.  -We discussed that it used to be thought that levodopa would increase risk of melanoma but now it is believed that Parkinsons itself likely increases risk of melanoma. he is to get regular skin checks.  -Greater than 50% of the 60 minute visit was spent in counseling answering questions and talking about what to expect now as well as in the future.  We talked about medication options as well as potential future surgical options.  We talked about safety in the home.  -Long discussion about medications but patient refused today  -Pt was very overwhelmed about the diagnosis.  Offered support.  Offered 2nd opinion.  He will let me know if he would like to take me up on that.  Doesn't want neurorehab right now.  -We discussed community resources in the area including patient support groups and community exercise programs for PD and pt education was provided to the patient.  2.  He asked about follow up.  He was really overwhelmed, as above, and I generally would want to see him sooner rather than later but he didn't want to make a follow up for 6 months.  I encouraged him to let me know if he changes his mind and I am happy to see him  sooner or get him a 2nd opinion.     Cc:  Eulas Post, MD

## 2016-10-03 ENCOUNTER — Ambulatory Visit (INDEPENDENT_AMBULATORY_CARE_PROVIDER_SITE_OTHER): Payer: PPO | Admitting: Neurology

## 2016-10-03 ENCOUNTER — Encounter: Payer: Self-pay | Admitting: Neurology

## 2016-10-03 DIAGNOSIS — G20 Parkinson's disease: Secondary | ICD-10-CM

## 2016-10-03 NOTE — Patient Instructions (Signed)
It was good to see you today.  Review your packet of patient information we provided and let us know if you have questions.  I'm happy to get you a second opinion if you would like or see you earlier if needed.

## 2016-12-25 ENCOUNTER — Telehealth: Payer: Self-pay

## 2016-12-25 NOTE — Telephone Encounter (Signed)
Call to Bryce Wood to try and schedule AWV on Tuesday, wed or Friday at [facility]. I completed his last year but we need to do this every year. Left a  Message to call back [facility] RN

## 2016-12-27 NOTE — Telephone Encounter (Signed)
The patient called back this am for AWV schedule with preference for Friday  Called back and left VM for Friday 21, or 28 at 8 -10 or to let me know  Therapist, art

## 2016-12-31 NOTE — Telephone Encounter (Signed)
Call back to Mr. Westbrook mobile Stated I am holding 9am for the 21st or 28th on Friday.  He is to call and confirm   Wynetta Fines RN

## 2017-01-03 ENCOUNTER — Ambulatory Visit (INDEPENDENT_AMBULATORY_CARE_PROVIDER_SITE_OTHER): Payer: PPO

## 2017-01-03 VITALS — BP 132/80 | HR 69 | Ht 73.0 in | Wt 189.0 lb

## 2017-01-03 DIAGNOSIS — Z0000 Encounter for general adult medical examination without abnormal findings: Secondary | ICD-10-CM | POA: Diagnosis not present

## 2017-01-03 NOTE — Progress Notes (Addendum)
Subjective:   Bryce Wood is a 71 y.o. male who presents for Medicare Annual/Subsequent preventive examination.  Married to wife - 35  April planning anniversary trip to [facility]  Has one Dtr  Diet Wife is retired and has last 65 lbs Eating well; going to the [employer] every day except for one Cut back on soft drinks  Now drinks one a day  Eats fish, chicken, good  Vegetables   Exercise Working with a Physiological scientist 1/2 hour a week Dx with parkinson's this past year As he was getting ready to retire, his thumb started quivering  Followed by [facility] now Playing golf 3 to 4 times a year Does the spinning classes but all the patients had parkinson's  Did like tennis and racket ball     BMI 24.9   Took your dtr and told her the 1st part of the conversation she would not like, but he discussed his wishes. The 2nd part was good   Health Maintenance Due  Topic Date Due  . TETANUS/TDAP  01/15/2016  . INFLUENZA VACCINE  08/14/2016   Documented flu vaccine taken at [facility] this year Will take tdap at the pharmacy     Objective:    Vitals: BP 132/80   Pulse 69   Ht 6\' 1"  (1.854 m)   Wt 189 lb (85.7 kg)   SpO2 93%   BMI 24.94 kg/m   Body mass index is 24.94 kg/m.  Advanced Directives 01/03/2017 12/20/2015  Does Patient Have a Medical Advance Directive? Yes Yes    Tobacco Social History   Tobacco Use  Smoking Status Never Smoker  Smokeless Tobacco Never Used     Counseling given: Yes   Clinical Intake:    Past Medical History:  Diagnosis Date  . ARTHRITIS 08/01/2008  . HYPERLIPIDEMIA 11/21/2008  . HYPERTENSION 08/01/2008  . OSTEOARTHRITIS 08/01/2008   Past Surgical History:  Procedure Laterality Date  . HERNIA REPAIR  1979   inguinal  . SPINE SURGERY  1999   disk   Family History  Problem Relation Age of Onset  . Heart disease Mother   . Heart attack Mother   . Hypertension Father   . Stroke Father   . Leukemia Father   .  Cancer Paternal Uncle 69       prostate cancer  . Hypertension Brother    Social History   Socioeconomic History  . Marital status: Married    Spouse name: Not on file  . Number of children: Not on file  . Years of education: Not on file  . Highest education level: Not on file  Social Needs  . Financial resource strain: Not on file  . Food insecurity - worry: Not on file  . Food insecurity - inability: Not on file  . Transportation needs - medical: Not on file  . Transportation needs - non-medical: Not on file  Occupational History  . Occupation: retired    Comment: Lawyer  Tobacco Use  . Smoking status: Never Smoker  . Smokeless tobacco: Never Used  Substance and Sexual Activity  . Alcohol use: No  . Drug use: No  . Sexual activity: Not on file  Other Topics Concern  . Not on file  Social History Narrative  . Not on file    Outpatient Encounter Medications as of 01/03/2017  Medication Sig  . acetaminophen (TYLENOL) 650 MG CR tablet Take 650 mg by mouth 3 (three) times daily.  Marland Kitchen  cholecalciferol (VITAMIN D) 1000 UNITS tablet Take 2,000 Units by mouth daily.  . Diclofenac Sodium 2 % SOLN Apply 1 pump twice daily as needed.  . fish oil-omega-3 fatty acids 1000 MG capsule Take 1 g by mouth daily.   Marland Kitchen lisinopril (PRINIVIL,ZESTRIL) 20 MG tablet Take 1 tablet (20 mg total) by mouth daily.  . meloxicam (MOBIC) 15 MG tablet Take 1 tablet by mouth once a day as needed  . triamcinolone cream (KENALOG) 0.5 % Apply 1 application topically 2 (two) times daily. To affected areas.  . TURMERIC PO Take by mouth.   No facility-administered encounter medications on file as of 01/03/2017.     Activities of Daily Living In your present state of health, do you have any difficulty performing the following activities: 01/03/2017  Hearing? N  Vision? N  Difficulty concentrating or making decisions? N  Walking or climbing stairs? N  Dressing or bathing? N  Doing errands,  shopping? N  Preparing Food and eating ? N  Using the Toilet? N  In the past six months, have you accidently leaked urine? N  Do you have problems with loss of bowel control? N  Managing your Medications? N  Managing your Finances? N  Housekeeping or managing your Housekeeping? N  Some recent data might be hidden    Patient Care Team: Eulas Post, MD as PCP - General   Assessment:   This is a routine wellness examination for Bryce Wood.  Exercise Activities and Dietary recommendations Current Exercise Habits: Home exercise routine;Structured exercise class, Type of exercise: walking, Time (Minutes): 60, Frequency (Times/Week): 5, Weekly Exercise (Minutes/Week): 300, Intensity: Moderate  Goals    . Patient Stated     To stay active and engaged        Fall Risk Fall Risk  01/03/2017 10/03/2016 12/20/2015 06/24/2014 06/24/2014  Falls in the past year? No No No No No    Depression Screen PHQ 2/9 Scores 01/03/2017 09/04/2016 12/20/2015 06/24/2014  PHQ - 2 Score 0 0 0 0   Very active and engaged patient. Admits to early depression when dx but then decided to "deal with it".  Postponing medication for now Encouraged review of the [employer] site and to read about treatments and current medications  Does not want to be a burden on his family  He has had a discussion with his only dtr regarding his wishes last year;    Cognitive Function MMSE - Mini Mental State Exam 01/03/2017 12/20/2015  Not completed: (No Data) (No Data)  no issues       Immunization History  Administered Date(s) Administered  . Influenza, High Dose Seasonal PF 10/18/2015  . Influenza-Unspecified 11/14/2016  . Pneumococcal Polysaccharide-23 02/11/2012  . Td 09/18/2001, 01/14/2006    Qualifies for Shingles Vaccine? Educated  Will run this by dr. Carles Collet but has had mild case of shingles and plans to take the vaccines in the future  Screening Tests Health Maintenance  Topic Date Due  .  TETANUS/TDAP  01/15/2016  . INFLUENZA VACCINE  08/14/2016  . PNA vac Low Risk Adult (2 of 2 - PCV13) 07/02/2017 (Originally 02/10/2013)  . COLONOSCOPY  06/24/2019 (Originally 11/22/1996)  . Hepatitis C Screening  Completed       Plan:     PCP Notes   Health Maintenance Documented receipt of flu vaccine Will take tdap at the pharmacy   Abnormal Screens  none  Referrals  None but encouraged review of the Parkinson's foundation web site  Patient concerns; Does not want to be a burden to his family; sees that taking medication will "prolong the burden" and has opted not to take for now. Encouraged to read about the disease and meds;  Still enjoying healthy type lifestyle; had difficulty being with other Parkinson's patient while biking; Acceptance level difficult to determine.    Nurse Concerns; As noted   Next PCP apt - TBS        I have personally reviewed and noted the following in the patient's chart:   . Medical and social history . Use of alcohol, tobacco or illicit drugs  . Current medications and supplements . Functional ability and status . Nutritional status . Physical activity . Advanced directives . List of other physicians . Hospitalizations, surgeries, and ER visits in previous 12 months . Vitals . Screenings to include cognitive, depression, and falls . Referrals and appointments  In addition, I have reviewed and discussed with patient certain preventive protocols, quality metrics, and best practice recommendations. A written personalized care plan for preventive services as well as general preventive health recommendations were provided to patient.     OTLXB,WIOMB, RN  01/03/2017  Above notes reviewed and agree.  Eulas Post MD  Primary Care at [facility]

## 2017-01-03 NOTE — Patient Instructions (Addendum)
Bryce Wood , Thank you for taking time to come for your Medicare Wellness Visit. I appreciate your ongoing commitment to your health goals. Please review the following plan we discussed and let me know if I can assist you in the future.   Try to visit the parkinson's foundation site  Shingrix is a vaccine for the prevention of Shingles in Adults 50 and older.  If you are on Medicare, you can request a prescription from your doctor to be filled at a pharmacy.  Please check with your benefits regarding applicable copays or out of pocket expenses.  The Shingrix is given in 2 vaccines approx 8 weeks apart. You must receive the 2nd dose prior to 6 months from receipt of the first.   A Tetanus is recommended every 10 years. Medicare covers a tetanus if you have a cut or wound; otherwise, there may be a charge. If you had not had a tetanus with pertusses, known as the Tdap, you can take this anytime.   May bring a copy of AD for your records   These are the goals we discussed: Goals    . Patient Stated     To stay active and engaged        This is a list of the screening recommended for you and due dates:  Health Maintenance  Topic Date Due  . Tetanus Vaccine  01/15/2016  . Flu Shot  08/14/2016  . Pneumonia vaccines (2 of 2 - PCV13) 07/02/2017*  . Colon Cancer Screening  06/24/2019*  .  Hepatitis C: One time screening is recommended by Center for Disease Control  (CDC) for  adults born from [birth year] through 1965.   Completed  *Topic was postponed. The date shown is not the original due date.      Fall Prevention in the Home Falls can cause injuries. They can happen to people of all ages. There are many things you can do to make your home safe and to help prevent falls. What can I do on the outside of my home?  Regularly fix the edges of walkways and driveways and fix any cracks.  Remove anything that might make you trip as you walk through a door, such as a raised step or  threshold.  Trim any bushes or trees on the path to your home.  Use bright outdoor lighting.  Clear any walking paths of anything that might make someone trip, such as rocks or tools.  Regularly check to see if handrails are loose or broken. Make sure that both sides of any steps have handrails.  Any raised decks and porches should have guardrails on the edges.  Have any leaves, snow, or ice cleared regularly.  Use sand or salt on walking paths during winter.  Clean up any spills in your garage right away. This includes oil or grease spills. What can I do in the bathroom?  Use night lights.  Install grab bars by the toilet and in the tub and shower. Do not use towel bars as grab bars.  Use non-skid mats or decals in the tub or shower.  If you need to sit down in the shower, use a plastic, non-slip stool.  Keep the floor dry. Clean up any water that spills on the floor as soon as it happens.  Remove soap buildup in the tub or shower regularly.  Attach bath mats securely with double-sided non-slip rug tape.  Do not have throw rugs and other things on the floor that  can make you trip. What can I do in the bedroom?  Use night lights.  Make sure that you have a light by your bed that is easy to reach.  Do not use any sheets or blankets that are too big for your bed. They should not hang down onto the floor.  Have a firm chair that has side arms. You can use this for support while you get dressed.  Do not have throw rugs and other things on the floor that can make you trip. What can I do in the kitchen?  Clean up any spills right away.  Avoid walking on wet floors.  Keep items that you use a lot in easy-to-reach places.  If you need to reach something above you, use a strong step stool that has a grab bar.  Keep electrical cords out of the way.  Do not use floor polish or wax that makes floors slippery. If you must use wax, use non-skid floor wax.  Do not have  throw rugs and other things on the floor that can make you trip. What can I do with my stairs?  Do not leave any items on the stairs.  Make sure that there are handrails on both sides of the stairs and use them. Fix handrails that are broken or loose. Make sure that handrails are as long as the stairways.  Check any carpeting to make sure that it is firmly attached to the stairs. Fix any carpet that is loose or worn.  Avoid having throw rugs at the top or bottom of the stairs. If you do have throw rugs, attach them to the floor with carpet tape.  Make sure that you have a light switch at the top of the stairs and the bottom of the stairs. If you do not have them, ask someone to add them for you. What else can I do to help prevent falls?  Wear shoes that: ? Do not have high heels. ? Have rubber bottoms. ? Are comfortable and fit you well. ? Are closed at the toe. Do not wear sandals.  If you use a stepladder: ? Make sure that it is fully opened. Do not climb a closed stepladder. ? Make sure that both sides of the stepladder are locked into place. ? Ask someone to hold it for you, if possible.  Clearly mark and make sure that you can see: ? Any grab bars or handrails. ? First and last steps. ? Where the edge of each step is.  Use tools that help you move around (mobility aids) if they are needed. These include: ? Canes. ? Walkers. ? Scooters. ? Crutches.  Turn on the lights when you go into a dark area. Replace any light bulbs as soon as they burn out.  Set up your furniture so you have a clear path. Avoid moving your furniture around.  If any of your floors are uneven, fix them.  If there are any pets around you, be aware of where they are.  Review your medicines with your doctor. Some medicines can make you feel dizzy. This can increase your chance of falling. Ask your doctor what other things that you can do to help prevent falls. This information is not intended to  replace advice given to you by your health care provider. Make sure you discuss any questions you have with your health care provider. Document Released: 10/27/2008 Document Revised: 06/08/2015 Document Reviewed: 02/04/2014 Elsevier Interactive Patient Education  [employer].  Health Maintenance, Male A healthy lifestyle and preventive care is important for your health and wellness. Ask your health care provider about what schedule of regular examinations is right for you. What should I know about weight and diet? Eat a Healthy Diet  Eat plenty of vegetables, fruits, whole grains, low-fat dairy products, and lean protein.  Do not eat a lot of foods high in solid fats, added sugars, or salt.  Maintain a Healthy Weight Regular exercise can help you achieve or maintain a healthy weight. You should:  Do at least 150 minutes of exercise each week. The exercise should increase your heart rate and make you sweat (moderate-intensity exercise).  Do strength-training exercises at least twice a week.  Watch Your Levels of Cholesterol and Blood Lipids  Have your blood tested for lipids and cholesterol every 5 years starting at 70 years of age. If you are at high risk for heart disease, you should start having your blood tested when you are 70 years old. You may need to have your cholesterol levels checked more often if: ? Your lipid or cholesterol levels are high. ? You are older than 70 years of age. ? You are at high risk for heart disease.  What should I know about cancer screening? Many types of cancers can be detected early and may often be prevented. Lung Cancer  You should be screened every year for lung cancer if: ? You are a current smoker who has smoked for at least 30 years. ? You are a former smoker who has quit within the past 15 years.  Talk to your health care provider about your screening options, when you should start screening, and how often you should be  screened.  Colorectal Cancer  Routine colorectal cancer screening usually begins at 70 years of age and should be repeated every 5-10 years until you are 70 years old. You may need to be screened more often if early forms of precancerous polyps or small growths are found. Your health care provider may recommend screening at an earlier age if you have risk factors for colon cancer.  Your health care provider may recommend using home test kits to check for hidden blood in the stool.  A small camera at the end of a tube can be used to examine your colon (sigmoidoscopy or colonoscopy). This checks for the earliest forms of colorectal cancer.  Prostate and Testicular Cancer  Depending on your age and overall health, your health care provider may do certain tests to screen for prostate and testicular cancer.  Talk to your health care provider about any symptoms or concerns you have about testicular or prostate cancer.  Skin Cancer  Check your skin from head to toe regularly.  Tell your health care provider about any new moles or changes in moles, especially if: ? There is a change in a mole's size, shape, or color. ? You have a mole that is larger than a pencil eraser.  Always use sunscreen. Apply sunscreen liberally and repeat throughout the day.  Protect yourself by wearing long sleeves, pants, a wide-brimmed hat, and sunglasses when outside.  What should I know about heart disease, diabetes, and high blood pressure?  If you are 58-17 years of age, have your blood pressure checked every 3-5 years. If you are 81 years of age or older, have your blood pressure checked every year. You should have your blood pressure measured twice-once when you are at a hospital or clinic, and once when  you are not at a hospital or clinic. Record the average of the two measurements. To check your blood pressure when you are not at a hospital or clinic, you can use: ? An automated blood pressure machine at a  pharmacy. ? A home blood pressure monitor.  Talk to your health care provider about your target blood pressure.  If you are between 3-50 years old, ask your health care provider if you should take aspirin to prevent heart disease.  Have regular diabetes screenings by checking your fasting blood sugar level. ? If you are at a normal weight and have a low risk for diabetes, have this test once every three years after the age of 68. ? If you are overweight and have a high risk for diabetes, consider being tested at a younger age or more often.  A one-time screening for abdominal aortic aneurysm (AAA) by ultrasound is recommended for men aged 53-75 years who are current or former smokers. What should I know about preventing infection? Hepatitis B If you have a higher risk for hepatitis B, you should be screened for this virus. Talk with your health care provider to find out if you are at risk for hepatitis B infection. Hepatitis C Blood testing is recommended for:  Everyone born from [birth year] through 1965.  Anyone with known risk factors for hepatitis C.  Sexually Transmitted Diseases (STDs)  You should be screened each year for STDs including gonorrhea and chlamydia if: ? You are sexually active and are younger than 70 years of age. ? You are older than 70 years of age and your health care provider tells you that you are at risk for this type of infection. ? Your sexual activity has changed since you were last screened and you are at an increased risk for chlamydia or gonorrhea. Ask your health care provider if you are at risk.  Talk with your health care provider about whether you are at high risk of being infected with HIV. Your health care provider may recommend a prescription medicine to help prevent HIV infection.  What else can I do?  Schedule regular health, dental, and eye exams.  Stay current with your vaccines (immunizations).  Do not use any tobacco products, such as  cigarettes, chewing tobacco, and e-cigarettes. If you need help quitting, ask your health care provider.  Limit alcohol intake to no more than 2 drinks per day. One drink equals 12 ounces of beer, 5 ounces of wine, or 1 ounces of hard liquor.  Do not use street drugs.  Do not share needles.  Ask your health care provider for help if you need support or information about quitting drugs.  Tell your health care provider if you often feel depressed.  Tell your health care provider if you have ever been abused or do not feel safe at home. This information is not intended to replace advice given to you by your health care provider. Make sure you discuss any questions you have with your health care provider. Document Released: 06/29/2007 Document Revised: 08/30/2015 Document Reviewed: 10/04/2014 Elsevier Interactive Patient Education  [employer].

## 2017-01-17 ENCOUNTER — Ambulatory Visit (INDEPENDENT_AMBULATORY_CARE_PROVIDER_SITE_OTHER): Payer: PPO | Admitting: Family Medicine

## 2017-01-17 ENCOUNTER — Encounter: Payer: Self-pay | Admitting: Family Medicine

## 2017-01-17 VITALS — BP 138/82 | HR 66 | Temp 97.7°F | Wt 192.0 lb

## 2017-01-17 DIAGNOSIS — I10 Essential (primary) hypertension: Secondary | ICD-10-CM

## 2017-01-17 NOTE — Progress Notes (Signed)
Subjective:     Patient ID: Bryce Wood, male   DOB: January 03, 1946, 72 y.o.   MRN: [id number]  HPI Patient seen with concerns for elevated blood pressure. He has hypertension diagnosis and takes lisinopril 20 mg daily.  He was at [facility] recently and had reading 194/95. He had recent visit here with blood pressure 132/80. Was at neurology few months ago with reading 140/84. Does not monitor blood pressure at home. He feels well. Still playing golf.  Diagnosis with Parkinson's disease recently. He plays golf a few times per week and exercises several days per week. No nonsteroidal use. No alcohol use. No headaches. No dizziness. No chest pains. Compliant with lisinopril.  Past Medical History:  Diagnosis Date  . ARTHRITIS 08/01/2008  . HYPERLIPIDEMIA 11/21/2008  . HYPERTENSION 08/01/2008  . OSTEOARTHRITIS 08/01/2008   Past Surgical History:  Procedure Laterality Date  . HERNIA REPAIR  1979   inguinal  . [place]   disk    reports that  has never smoked. he has never used smokeless tobacco. He reports that he does not drink alcohol or use drugs. family history includes Cancer (age of onset: 46) in his paternal uncle; Heart attack in his mother; Heart disease in his mother; Hypertension in his brother and father; Leukemia in his father; Stroke in his father. No Known Allergies   Review of Systems  Constitutional: Negative for fatigue.  Eyes: Negative for visual disturbance.  Respiratory: Negative for cough, chest tightness and shortness of breath.   Cardiovascular: Negative for chest pain, palpitations and leg swelling.  Neurological: Negative for dizziness, syncope, weakness, light-headedness and headaches.       Objective:   Physical Exam  Constitutional: He is oriented to person, place, and time. He appears well-developed and well-nourished.  HENT:  Right Ear: External ear normal.  Left Ear: External ear normal.  Mouth/Throat: Oropharynx is clear and moist.   Eyes: Pupils are equal, round, and reactive to light.  Neck: Neck supple. No thyromegaly present.  Cardiovascular: Normal rate and regular rhythm.  Pulmonary/Chest: Effort normal and breath sounds normal. No respiratory distress. He has no wheezes. He has no rales.  Musculoskeletal: He exhibits no edema.  Neurological: He is alert and oriented to person, place, and time.       Assessment:     Hypertension. Repeat here after rest 138/82    Plan:     -We recommend observation for now -Continue lisinopril 20 mg daily -Discussed nonpharmacologic management and importance of continued regular aerobic exercise -Be in touch if consistent blood pressure readings greater than 140/90  Eulas Post MD [facility]

## 2017-01-17 NOTE — Patient Instructions (Signed)
Monitor blood pressure and be in touch if consistently > 140/90.   

## 2017-03-31 NOTE — Progress Notes (Signed)
[facility] was seen today in the movement disorders clinic for neurologic consultation at the request of Eulas Post, MD.  This patient is accompanied in the office by his spouse who supplements the history.  The consultation is for the evaluation of R hand tremor x 1 year.  The records that were made available to me were reviewed.     04/02/16 update: Patient is seen today in follow-up.  He is accompanied by his wife, who supplements the history.  He was seen last September and diagnosed with Parkinson's disease.  He was very overwhelmed with the diagnosis.  Refused medication, PD rehab and community services.  However, he reports that he is going to a personal trainer one time per week, and plays golf 3-4 times week.  Records indicate he told the nurse at [facility] that he didn't want medication because didn't want to prolong the burden on family.  The records that were made available to me were reviewed.  Saw PCP in January.  PREVIOUS MEDICATIONS: none to date  ALLERGIES:  No Known Allergies  CURRENT MEDICATIONS:  Outpatient Encounter Medications as of 04/02/2017  Medication Sig  . acetaminophen (TYLENOL) 650 MG CR tablet Take 650 mg by mouth 3 (three) times daily.  . cholecalciferol (VITAMIN D) 1000 UNITS tablet Take 2,000 Units by mouth daily.  . Diclofenac Sodium 2 % SOLN Apply 1 pump twice daily as needed.  . fish oil-omega-3 fatty acids 1000 MG capsule Take 1 g by mouth daily.   Marland Kitchen lisinopril (PRINIVIL,ZESTRIL) 20 MG tablet Take 1 tablet (20 mg total) by mouth daily.  . meloxicam (MOBIC) 15 MG tablet Take 1 tablet by mouth once a day as needed  . triamcinolone cream (KENALOG) 0.5 % Apply 1 application topically 2 (two) times daily. To affected areas.  . TURMERIC PO Take by mouth.   No facility-administered encounter medications on file as of 04/02/2017.     PAST MEDICAL HISTORY:   Past Medical History:  Diagnosis Date  . ARTHRITIS 08/01/2008  . HYPERLIPIDEMIA 11/21/2008  .  HYPERTENSION 08/01/2008  . OSTEOARTHRITIS 08/01/2008    PAST SURGICAL HISTORY:   Past Surgical History:  Procedure Laterality Date  . HERNIA REPAIR  1979   inguinal  . SPINE SURGERY  1999   disk    SOCIAL HISTORY:   Social History   Socioeconomic History  . Marital status: Married    Spouse name: Not on file  . Number of children: Not on file  . Years of education: Not on file  . Highest education level: Not on file  Social Needs  . Financial resource strain: Not on file  . Food insecurity - worry: Not on file  . Food insecurity - inability: Not on file  . Transportation needs - medical: Not on file  . Transportation needs - non-medical: Not on file  Occupational History  . Occupation: retired    Comment: Lawyer  Tobacco Use  . Smoking status: Never Smoker  . Smokeless tobacco: Never Used  Substance and Sexual Activity  . Alcohol use: No  . Drug use: No  . Sexual activity: Not on file  Other Topics Concern  . Not on file  Social History Narrative  . Not on file    FAMILY HISTORY:   Family Status  Relation Name Status  . Mother  Deceased  . Father  Deceased  . Daughter 1 Alive  . Annamarie Major  (Not Specified)  . Brother  Alive  ROS:  A complete 10 system review of systems was obtained and was unremarkable apart from what is mentioned above.  PHYSICAL EXAMINATION:    VITALS:   There were no vitals filed for this visit.  GEN:  The patient appears stated age and is in NAD. HEENT:  Normocephalic, atraumatic.  The mucous membranes are moist. The superficial temporal arteries are without ropiness or tenderness. CV:  RRR Lungs:  CTAB Neck/HEME:  There are no carotid bruits bilaterally.  Neurological examination:  Orientation: The patient is alert and oriented x 3 Cranial nerves: There is good facial symmetry.  Extraocular muscles are intact. The visual fields are full to confrontational testing. The speech is fluent and clear. Soft palate rises  symmetrically and there is no tongue deviation. Hearing is intact to conversational tone. Sensation: Sensation is intact to light throughout (facial, trunk, extremities). Vibration is intact at the bilateral big toe. There is no extinction with double simultaneous stimulation. There is no sensory dermatomal level identified. Motor: Strength is at least antigravity x 4   Movement examination: Tone: There is mod to severe increase in tone in the RUE/RLE and mild increase on the L Abnormal movements: There is RUE resting tremor Coordination:  There is decremation, with any form of RAMS, including alternating supination and pronation of the forearm, hand opening and closing, finger taps, heel taps and toe taps on the R.  The L is normal Gait and Station: The patient has minimal difficulty arising out of a deep-seated chair without the use of the hands. The patient's stride length is normal with stooped posture and decreased arm swing on the right.  There is re-emergent tremor on the right.  The patient has a negative pull test.      Lab Results  Component Value Date   TSH 1.12 09/04/2016     Chemistry      Component Value Date/Time   NA 140 09/04/2016 0947   K 5.0 09/04/2016 0947   CL 104 09/04/2016 0947   CO2 29 09/04/2016 0947   BUN 19 09/04/2016 0947   CREATININE 1.03 09/04/2016 0947      Component Value Date/Time   CALCIUM 9.9 09/04/2016 0947   ALKPHOS 61 09/04/2016 0947   AST 20 09/04/2016 0947   ALT 16 09/04/2016 0947   BILITOT 0.5 09/04/2016 0947       ASSESSMENT/PLAN:  1.  Idiopathic Parkinson's disease.  The patient has tremor, bradykinesia, rigidity and mild postural instability.  New dx:  10/03/16  -We discussed the diagnosis as well as pathophysiology of the disease.  We discussed treatment options as well as prognostic indicators.  Patient education was provided.  -patient had many questions today.  Brings a list and I answered them to the best of my ability.  He asked  me whether or not I would recommend medication and I absolutely would given degree of rigidity.  He ultimately declined medication.  -continue exercise with adding on more CV exercise.  2.  F/u 6 months.  Much greater than 50% of this visit was spent in counseling and coordinating care.  Total face to face time:  30 min   Cc:  Eulas Post, MD

## 2017-04-02 ENCOUNTER — Ambulatory Visit: Payer: PPO | Admitting: Neurology

## 2017-04-02 ENCOUNTER — Encounter: Payer: Self-pay | Admitting: Neurology

## 2017-04-02 VITALS — BP 118/72 | HR 76 | Ht 73.0 in | Wt 189.0 lb

## 2017-04-02 DIAGNOSIS — G20 Parkinson's disease: Secondary | ICD-10-CM | POA: Diagnosis not present

## 2017-06-11 ENCOUNTER — Ambulatory Visit (INDEPENDENT_AMBULATORY_CARE_PROVIDER_SITE_OTHER)
Admission: RE | Admit: 2017-06-11 | Discharge: 2017-06-11 | Disposition: A | Discharge status: Home / Self Care | Payer: PPO | Source: Ambulatory Visit | Attending: Family Medicine | Admitting: Family Medicine

## 2017-06-11 ENCOUNTER — Encounter: Payer: Self-pay | Admitting: Family Medicine

## 2017-06-11 ENCOUNTER — Ambulatory Visit: Payer: Self-pay

## 2017-06-11 ENCOUNTER — Ambulatory Visit (INDEPENDENT_AMBULATORY_CARE_PROVIDER_SITE_OTHER): Payer: PPO | Admitting: Family Medicine

## 2017-06-11 VITALS — BP 140/72 | HR 68 | Ht 73.0 in

## 2017-06-11 DIAGNOSIS — M25561 Pain in right knee: Secondary | ICD-10-CM

## 2017-06-11 DIAGNOSIS — M1711 Unilateral primary osteoarthritis, right knee: Secondary | ICD-10-CM

## 2017-06-11 NOTE — Patient Instructions (Signed)
Good to see you  Injected the knee today  Ice 20 minutes 2 times daily. Usually after activity and before bed. Xrays downstairs Try the brace with a lot of activity  If you change your mind clal me and we will consider a custom medial unloader brace See me again in 3 weeks

## 2017-06-11 NOTE — Assessment & Plan Note (Signed)
Patient given injection today.  Discussed icing regimen and home exercises.  Discussed which activities to doing which wants to avoid.  Patient given a hinged brace today but we encouraged a potential medial unloader brace.  Patient wanted to avoid that right now.  She will try this and follow-up in 4 weeks.  We will check if patient is a candidate for Visco supplementation.

## 2017-06-11 NOTE — Progress Notes (Signed)
[name] [place], [place] [address] Phone: [phone] Subjective:     CC: Right knee pain  [id number]  Bryce [facility] is a 72 y.o. male coming in with complaint of right knee pain on the medial aspect. Last Friday he noticed pain in this area and after golfing today he now has an antalgic gait. Denies any radiating pain. Did try to muscle analgesic last week. 5/10 currently.  Patient since we have seen him has been diagnosed with Parkinson's which was what some significant concern of.  Patient states that recently the pain has been bad enough that has stopped him from activities such as golfing.    Past Medical History:  Diagnosis Date  . ARTHRITIS 08/01/2008  . HYPERLIPIDEMIA 11/21/2008  . HYPERTENSION 08/01/2008  . OSTEOARTHRITIS 08/01/2008   Past Surgical History:  Procedure Laterality Date  . HERNIA REPAIR  1979   inguinal  . SPINE SURGERY  1999   disk   Social History   Socioeconomic History  . Marital status: Married    Spouse name: Not on file  . Number of children: Not on file  . Years of education: Not on file  . Highest education level: Not on file  Occupational History  . Occupation: retired    Comment: Lawyer  Social Needs  . Financial resource strain: Not on file  . Food insecurity:    Worry: Not on file    Inability: Not on file  . Transportation needs:    Medical: Not on file    Non-medical: Not on file  Tobacco Use  . Smoking status: Never Smoker  . Smokeless tobacco: Never Used  Substance and Sexual Activity  . Alcohol use: No  . Drug use: No  . Sexual activity: Not on file  Lifestyle  . Physical activity:    Days per week: Not on file    Minutes per session: Not on file  . Stress: Not on file  Relationships  . Social connections:    Talks on phone: Not on file    Gets together: Not on file    Attends religious service: Not on file    Active member of club or organization: Not on  file    Attends meetings of clubs or organizations: Not on file    Relationship status: Not on file  Other Topics Concern  . Not on file  Social History Narrative  . Not on file   No Known Allergies Family History  Problem Relation Age of Onset  . Heart disease Mother   . Heart attack Mother   . Hypertension Father   . Stroke Father   . Leukemia Father   . Cancer Paternal Uncle 66       prostate cancer  . Hypertension Brother      Past medical history, social, surgical and family history all reviewed in electronic medical record.  No pertanent information unless stated regarding to the chief complaint.   Review of Systems:Review of systems updated and as accurate as of 06/11/17  No headache, visual changes, nausea, vomiting, diarrhea, constipation, dizziness, abdominal pain, skin rash, fevers, chills, night sweats, weight loss, swollen lymph nodes, body aches, , chest pain, shortness of breath, mood changes.  Positive muscle aches, joint swelling  Objective  Blood pressure 140/72, pulse 68, height 6\' 1"  (1.854 m), SpO2 97 %. Systems examined below as of 06/11/17   General: No apparent distress alert and oriented x3 mood and affect  normal, dressed appropriately.  Mask facies HEENT: Pupils equal, extraocular movements intact  Respiratory: Patient's speak in full sentences and does not appear short of breath  Cardiovascular: No lower extremity edema, non tender, no erythema  Skin: Warm dry intact with no signs of infection or rash on extremities or on axial skeleton.  Abdomen: Soft nontender  Neuro: Cranial nerves II through XII are intact, neurovascularly intact in all extremities with 2+ DTRs and 2+ pulses.  Lymph: No lymphadenopathy of posterior or anterior cervical chain or axillae bilaterally.  Gait normal with good balance and coordination.  MSK: Mild tender with full range of motion and good stability and symmetric strength and hyper tone of shoulders, elbows, wrist, hip,  and ankles bilaterally.  Resting tremor of the right upper extremity noted though.  Cogwheeling noted.  Arthritic changes of multiple joints Knee: Right valgus deformity noted.  Abnormal thigh to calf ratio.  Tender to palpation over medial and PF joint line.  ROM full in flexion and extension and lower leg rotation. instability with valgus force.  painful patellar compression. Patellar glide with moderate crepitus. Patellar and quadriceps tendons unremarkable. Hamstring and quadriceps strength is normal. Contralateral knee shows mild arthritic changes but no pain  Limited musculoskeletal ultrasound was performed and interpreted by Lyndal Pulley  Limited ultrasound of the right knee shows the patient has moderate to severe narrowing of the medial joint line with a chronic meniscal tear that is moderately displaced.  Patient does have a small joint effusion noted of the patellofemoral joint and moderate arthritic changes there as well.  After informed written and verbal consent, patient was seated on exam table. Right knee was prepped with alcohol swab and utilizing anterolateral approach, patient's right knee space was injected with 4:1  marcaine 0.5%: Kenalog 40mg /dL. Patient tolerated the procedure well without immediate complications.   Impression and Recommendations:     This case required medical decision making of moderate complexity.      Note: This dictation was prepared with Dragon dictation along with smaller phrase technology. Any transcriptional errors that result from this process are unintentional.

## 2017-07-10 ENCOUNTER — Encounter: Payer: Self-pay | Admitting: Family Medicine

## 2017-07-10 ENCOUNTER — Ambulatory Visit (INDEPENDENT_AMBULATORY_CARE_PROVIDER_SITE_OTHER): Payer: PPO | Admitting: Family Medicine

## 2017-07-10 DIAGNOSIS — M1711 Unilateral primary osteoarthritis, right knee: Secondary | ICD-10-CM | POA: Diagnosis not present

## 2017-07-10 NOTE — Assessment & Plan Note (Signed)
Doing well at this time.  No significant changes in management.  95% better.  Follow-up as needed

## 2017-07-10 NOTE — Progress Notes (Signed)
[name] [place], [place] [address] Phone: [phone] Subjective:     CC: Knee pain follow-up  [id number]  Bryce Wood is a 72 y.o. male coming in with complaint of the pain follow-up.  Patient did have pain in the knee.  Discussed with patient is doing 95% better.  Feels like it is making progress.  Wearing the brace when doing a lot of activity but otherwise not wearing it.  Feels like he is doing well with no significant swelling.     Past Medical History:  Diagnosis Date  . ARTHRITIS 08/01/2008  . HYPERLIPIDEMIA 11/21/2008  . HYPERTENSION 08/01/2008  . OSTEOARTHRITIS 08/01/2008   Past Surgical History:  Procedure Laterality Date  . HERNIA REPAIR  1979   inguinal  . SPINE SURGERY  1999   disk   Social History   Socioeconomic History  . Marital status: Married    Spouse name: Not on file  . Number of children: Not on file  . Years of education: Not on file  . Highest education level: Not on file  Occupational History  . Occupation: retired    Comment: Lawyer  Social Needs  . Financial resource strain: Not on file  . Food insecurity:    Worry: Not on file    Inability: Not on file  . Transportation needs:    Medical: Not on file    Non-medical: Not on file  Tobacco Use  . Smoking status: Never Smoker  . Smokeless tobacco: Never Used  Substance and Sexual Activity  . Alcohol use: No  . Drug use: No  . Sexual activity: Not on file  Lifestyle  . Physical activity:    Days per week: Not on file    Minutes per session: Not on file  . Stress: Not on file  Relationships  . Social connections:    Talks on phone: Not on file    Gets together: Not on file    Attends religious service: Not on file    Active member of club or organization: Not on file    Attends meetings of clubs or organizations: Not on file    Relationship status: Not on file  Other Topics Concern  . Not on file  Social History  Narrative  . Not on file   No Known Allergies Family History  Problem Relation Age of Onset  . Heart disease Mother   . Heart attack Mother   . Hypertension Father   . Stroke Father   . Leukemia Father   . Cancer Paternal Uncle 22       prostate cancer  . Hypertension Brother      Past medical history, social, surgical and family history all reviewed in electronic medical record.  No pertanent information unless stated regarding to the chief complaint.   Review of Systems:Review of systems updated and as accurate as of 07/10/17  No headache, visual changes, nausea, vomiting, diarrhea, constipation, dizziness, abdominal pain, skin rash, fevers, chills, night sweats, weight loss, swollen lymph nodes, body aches, joint swelling,  chest pain, shortness of breath, mood changes.  Positive muscle aches  Objective  Blood pressure 122/70, pulse 62, height 6\' 1"  (1.854 m), weight 182 lb (82.6 kg), SpO2 98 %. Systems examined below as of 07/10/17   General: No apparent distress alert and oriented x3 mood and affect normal, dressed appropriately.  Masked facies HEENT: Pupils equal, extraocular movements intact  Respiratory: Patient's speak in full sentences  and does not appear short of breath  Cardiovascular: Trace lower extremity edema, non tender, no erythema  Skin: Warm dry intact with no signs of infection or rash on extremities or on axial skeleton.  Abdomen: Soft nontender  Neuro: Cranial nerves II through XII are intact, neurovascularly intact in all extremities with 2+ DTRs and 2+ pulses.  Lymph: No lymphadenopathy of posterior or anterior cervical chain or axillae bilaterally.  Gait antalgic with a shuffling gait.  MSK:  tender with full range of motion and good stability and symmetric strength and tone of shoulders, elbows, wrist, hip, and ankles bilaterally.  Cogwheeling noted Knee: Right valgus deformity noted.   Tender to palpation over medial and PF joint line.  ROM full in  flexion and extension and lower leg rotation.  Cogwheeling instability with valgus force.  painful patellar compression. Patellar glide with moderate crepitus. Patellar and quadriceps tendons unremarkable. Hamstring and quadriceps strength is normal. Contralateral knee shows mild arthritic changes of mild cogwheeling    Impression and Recommendations:     This case required medical decision making of moderate complexity.      Note: This dictation was prepared with Dragon dictation along with smaller phrase technology. Any transcriptional errors that result from this process are unintentional.

## 2017-07-24 DIAGNOSIS — L819 Disorder of pigmentation, unspecified: Secondary | ICD-10-CM | POA: Diagnosis not present

## 2017-07-24 DIAGNOSIS — D225 Melanocytic nevi of trunk: Secondary | ICD-10-CM | POA: Diagnosis not present

## 2017-07-24 DIAGNOSIS — L821 Other seborrheic keratosis: Secondary | ICD-10-CM | POA: Diagnosis not present

## 2017-07-24 DIAGNOSIS — L570 Actinic keratosis: Secondary | ICD-10-CM | POA: Diagnosis not present

## 2017-09-18 NOTE — Progress Notes (Signed)
Bryce Wood was seen today in the movement disorders clinic for neurologic consultation at the request of Bryce Post, MD.  This patient is accompanied in the office by his spouse who supplements the history.  The consultation is for the evaluation of R hand tremor x 1 year.  The records that were made available to me were reviewed.     04/02/16 update: Patient is seen today in follow-up.  He is accompanied by his wife, who supplements the history.  He was seen last September and diagnosed with Parkinson's disease.  He was very overwhelmed with the diagnosis.  Refused medication, PD rehab and community services.  However, he reports that he is going to a personal trainer one time per week, and plays golf 3-4 times week.  Records indicate he told the nurse at [facility] that he didn't want medication because didn't want to prolong the burden on family.  The records that were made available to me were reviewed.  Saw PCP in January.  09/22/17 update: Patient is seen today in follow-up for Parkinson's disease.  He has had some cramping in the hands.  He is "about" ready to take medication but wants to take a trip to [place] first and wants to wait until next visit.  He is on no medication, which was by his choice.  He has had no falls since last visit.  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  Mood has been good.  No lightheadedness or near syncope.  No hallucinations.  Records are reviewed since last visit.  He saw Dr. Tamala Wood in May and June for knee pain.  He has a knee brace now and knee pain is better.   PREVIOUS MEDICATIONS: none to date  ALLERGIES:  No Known Allergies  CURRENT MEDICATIONS:  Outpatient Encounter Medications as of 09/22/2017  Medication Sig  . acetaminophen (TYLENOL) 650 MG CR tablet Take 650 mg by mouth 3 (three) times daily.  . cholecalciferol (VITAMIN D) 1000 UNITS tablet Take 2,000 Units by mouth daily.  . fish oil-omega-3 fatty  acids 1000 MG capsule Take 1 g by mouth daily.   Marland Kitchen lisinopril (PRINIVIL,ZESTRIL) 20 MG tablet Take 1 tablet (20 mg total) by mouth daily.  . TURMERIC PO Take by mouth.   No facility-administered encounter medications on file as of 09/22/2017.     PAST MEDICAL HISTORY:   Past Medical History:  Diagnosis Date  . ARTHRITIS 08/01/2008  . HYPERLIPIDEMIA 11/21/2008  . HYPERTENSION 08/01/2008  . OSTEOARTHRITIS 08/01/2008    PAST SURGICAL HISTORY:   Past Surgical History:  Procedure Laterality Date  . HERNIA REPAIR  1979   inguinal  . SPINE SURGERY  1999   disk    SOCIAL HISTORY:   Social History   Socioeconomic History  . Marital status: Married    Spouse name: Not on file  . Number of children: Not on file  . Years of education: Not on file  . Highest education level: Not on file  Occupational History  . Occupation: retired    Comment: Lawyer  Social Needs  . Financial resource strain: Not on file  . Food insecurity:    Worry: Not on file    Inability: Not on file  . Transportation needs:    Medical: Not on file    Non-medical: Not on file  Tobacco Use  . Smoking status: Never Smoker  . Smokeless tobacco: Never Used  Substance  and Sexual Activity  . Alcohol use: No  . Drug use: No  . Sexual activity: Not on file  Lifestyle  . Physical activity:    Days per week: Not on file    Minutes per session: Not on file  . Stress: Not on file  Relationships  . Social connections:    Talks on phone: Not on file    Gets together: Not on file    Attends religious service: Not on file    Active member of club or organization: Not on file    Attends meetings of clubs or organizations: Not on file    Relationship status: Not on file  . Intimate partner violence:    Fear of current or ex partner: Not on file    Emotionally abused: Not on file    Physically abused: Not on file    Forced sexual activity: Not on file  Other Topics Concern  . Not on file  Social  History Narrative  . Not on file    FAMILY HISTORY:   Family Status  Relation Name Status  . Mother  Deceased  . Father  Deceased  . Daughter 1 Alive  . Annamarie Major  (Not Specified)  . Brother  Alive    ROS:  Review of Systems  Constitutional: Negative.   HENT: Negative.   Eyes: Negative.   Respiratory: Negative.   Cardiovascular: Negative.   Gastrointestinal: Negative.   Genitourinary: Negative.   Musculoskeletal: Negative.   Skin: Negative.   Endo/Heme/Allergies: Negative.   Psychiatric/Behavioral: Negative.     PHYSICAL EXAMINATION:    VITALS:   Vitals:   09/22/17 0826  BP: 120/78  Pulse: 84  SpO2: 98%  Weight: 182 lb (82.6 kg)  Height: 6\' 2"  (1.88 m)    GEN:  The patient appears stated age and is in NAD. HEENT:  Normocephalic, atraumatic.  The mucous membranes are moist. The superficial temporal arteries are without ropiness or tenderness. CV:  RRR Lungs:  CTAB Neck/HEME:  There are no carotid bruits bilaterally.  Neurological examination:  Orientation: The patient is alert and oriented x3. Cranial nerves: There is good facial symmetry. The speech is fluent and clear. Soft palate rises symmetrically and there is no tongue deviation. Hearing is intact to conversational tone. Sensation: Sensation is intact to light touch throughout Motor: Strength is 5/5 in the bilateral upper and lower extremities.   Shoulder shrug is equal and symmetric.  There is no pronator drift.  Movement examination: Tone: There is mod to severe increase in tone in the RUE/RLE.  Tone in the LUE is mild to mod increase Abnormal movements: There is near constant RUE resting tremor Coordination:  There is decremation with any form of RAMS, including alternating supination and pronation of the forearm, hand opening and closing, finger taps, heel taps and toe taps on the right Gait and Station: The patient has minimal difficulty arising out of a deep-seated chair without the use of the  hands. The patient's stride length is decreased.  He is dragging the right leg.  Neg pull test  Lab Results  Component Value Date   TSH 1.12 09/04/2016     Chemistry      Component Value Date/Time   NA 140 09/04/2016 0947   K 5.0 09/04/2016 0947   CL 104 09/04/2016 0947   CO2 29 09/04/2016 0947   BUN 19 09/04/2016 0947   CREATININE 1.03 09/04/2016 0947      Component Value Date/Time  CALCIUM 9.9 09/04/2016 0947   ALKPHOS 61 09/04/2016 0947   AST 20 09/04/2016 0947   ALT 16 09/04/2016 0947   BILITOT 0.5 09/04/2016 0947       ASSESSMENT/PLAN:  1.  Idiopathic Parkinson's disease.  The patient has tremor, bradykinesia, rigidity and mild postural instability.  New dx:  10/03/16  -We discussed the diagnosis as well as pathophysiology of the disease.  We discussed treatment options as well as prognostic indicators.  Patient education was provided.  -patient needs medication but wants to hold until next visit.    -pt information given  -invited to pARTS program 2. F/u 4 months   Cc:  Bryce Post, MD

## 2017-09-22 ENCOUNTER — Ambulatory Visit (INDEPENDENT_AMBULATORY_CARE_PROVIDER_SITE_OTHER): Payer: PPO | Admitting: Neurology

## 2017-09-22 ENCOUNTER — Encounter: Payer: Self-pay | Admitting: Neurology

## 2017-09-22 VITALS — BP 120/78 | HR 84 | Ht 74.0 in | Wt 182.0 lb

## 2017-09-22 DIAGNOSIS — G20 Parkinson's disease: Secondary | ICD-10-CM

## 2017-09-22 NOTE — Patient Instructions (Signed)
 Community Parkinson's Exercise Programs   Parkinson's Wellness Recovery Exercise Programs:   PWR! Moves PD Exercise Class:  This is a therapist-led exercise class for people with Parkinson's disease in the [place] community. It consists of a one-hour exercise class each week. Classes are offered in eight-week sessions, and the cost per session is $80. Class size is limited to a maximum of 20 participants. Participant criteria includes: Participant must be able to get up and down from the floor with minimal to no assistance, have had 0-1 falls in the past 6 months, and have completed physical or occupational therapy at [facility] within the past year.  To find out more about session dates, questions, or to register, please contact Amy Marriott, Physical Therapist, or Denise Robertson, Physical Therapist Assistant, at [facility] at [phone].  PWR! Circuit Class:  This is a therapist-led exercise class with intervals of circuit activities incorporating PWR! Moves into functional activities. It consists of one 45-minute exercise class per week. Classes are offered in eight-week sessions, and the cost per session is $120. Class size is limited to a maximum of eight participants to allow for hands-on instruction. Participant criteria: class is ideal for people with Parkinson's disease who have completed PWR! Moves Exercise Class or who are currently independently exercising and want to be challenged, must be able to walk independently with 0-1 falls in the past 6 months, able to get up and down from the floor independently, able to sit to stand independently, and able to jog 20 feet.   To find out more about session dates, questions, or to register, please contact Amy Marriott, Physical Therapist, or Denise Robertson, Physical Therapist Assistant, at [facility] at [phone].   [facility] Parkinson's Cycle:    Parkinson's Cycle Class at [facility] This is an ongoing class on Monday and Thursday mornings at 10:45 a.m. A healthcare provider referral is required to enroll. This class is FREE to participants, and you do not have to be a member of the [facility] to enroll. Contact Beth at [phone] or [email or web address]. Parkinson's Cycle Class at [facility] Ongoing Class Monday, Wednesday, and Friday mornings at 9:00 a.m. A healthcare provider referral is required to enroll. This class is FREE to participants, and you do not have to be a member of the [facility] to enroll. Contact Marlee at [phone] or [email or web address]. Parkinson's Cycle Class at [facility] Ongoing Class every Friday mornings at 12 p.m.  A healthcare provider referral is required to enroll. This class is FREE to participants, and you do not have to be a member of the [facility] to enroll. Contact [phone].  Parkinson's Cycle Class at [facility] Ongoing Class every Monday at 12pm.  A healthcare provider referral is required to enroll. This class is FREE to participants, and you do not have to be a member of the [facility] to enroll. Contact Julie at [phone] or  [email or web address].   Rock Steady Boxing:  [facility] [place]  Classes are offered Mondays at 5:15 p.m. and Tuesdays and Thursdays at 12 p.m. at [facility]. For more information, contact [phone] or visit [email or web address] or [email or web address]. [employer] [facility] [place] Classes are offered Monday, Wednesday, and Friday from 9:30 a.m. - 11:00 a.m. For more information, contact [phone] or [phone] or email [email or web address] or visit [email or web address] or [email or web address]/. [facility] [place] (classes are offered at 2 locations) . [facility] in [place] (for more information,   contact Thad Stovall at  [phone] or email [email or web address] . [place] [facility] (class is open to the public -- for more information, contact Michael Cain at [phone] or email [email or web address]) Rock Ste[facility] [place] Classes are held at [facility] in [place], [place]. For more information, call Dr. Laura Beck at [phone] or [email or web address].   Personal Training for Parkinson's:   [employer] Offers certified personal training to customize a program to meet your exercise needs to address Parkinson's disease. For more information, contact [phone] or visit [email or web address].  Community Dance for Parkinson's:   Community dance class for people with Parkinson's Disease Wednesdays at 9 a.m. [employer] [address] [place], [place] [address] Please contact Christina Soriano [phone] for more information  Scholarships Available for Fitness Programs:  The [employer] for Parkinston's is a non-profit 501(C)3 organization run by volunteers, whose mission is to strive to empower those living with Parkinson's Disease (PD), Progressive Supra-Nuclear Palsy (PSP) and Multiple System Atrophy (MSA).  Through financial support, recipients benefit from individual and group programs. [phone] [email or web address]  

## 2017-10-30 NOTE — Progress Notes (Signed)
Bryce Wood was seen today in the movement disorders clinic for neurologic consultation at the request of Eulas Post, MD.  This patient is accompanied in the office by his spouse who supplements the history.  The consultation is for the evaluation of R hand tremor x 1 year.  The records that were made available to me were reviewed.     04/02/16 update: Patient is seen today in follow-up.  He is accompanied by his wife, who supplements the history.  He was seen last September and diagnosed with Parkinson's disease.  He was very overwhelmed with the diagnosis.  Refused medication, PD rehab and community services.  However, he reports that he is going to a personal trainer one time per week, and plays golf 3-4 times week.  Records indicate he told the nurse at [facility] that he didn't want medication because didn't want to prolong the burden on family.  The records that were made available to me were reviewed.  Saw PCP in January.  09/22/17 update: Patient is seen today in follow-up for Parkinson's disease.  He has had some cramping in the hands.  He is "about" ready to take medication but wants to take a trip to [place] first and wants to wait until next visit.  He is on no medication, which was by his choice.  He has had no falls since last visit.  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  Mood has been good.  No lightheadedness or near syncope.  No hallucinations.  Records are reviewed since last visit.  He saw Dr. Tamala Julian in May and June for knee pain.  He has a knee brace now and knee pain is better.   11/03/17 update: Patient is seen today in follow-up for Parkinson's disease, accompanied by his wife who supplements history.  Patient has chosen not to take medication at this point.  He reports that he has been doing great.  He is noting dexterity issues in dominant hand.  Having more trouble with cutting food.    He has had no falls since last visit.  No  lightheadedness or near syncope.  PREVIOUS MEDICATIONS: none to date  ALLERGIES:  No Known Allergies  CURRENT MEDICATIONS:  Outpatient Encounter Medications as of 11/03/2017  Medication Sig  . acetaminophen (TYLENOL) 650 MG CR tablet Take 650 mg by mouth 3 (three) times daily.  . cholecalciferol (VITAMIN D) 1000 UNITS tablet Take 2,000 Units by mouth daily.  . fish oil-omega-3 fatty acids 1000 MG capsule Take 1 g by mouth daily.   Marland Kitchen lisinopril (PRINIVIL,ZESTRIL) 20 MG tablet Take 1 tablet (20 mg total) by mouth daily.  . TURMERIC PO Take by mouth.   No facility-administered encounter medications on file as of 11/03/2017.     PAST MEDICAL HISTORY:   Past Medical History:  Diagnosis Date  . ARTHRITIS 08/01/2008  . HYPERLIPIDEMIA 11/21/2008  . HYPERTENSION 08/01/2008  . OSTEOARTHRITIS 08/01/2008    PAST SURGICAL HISTORY:   Past Surgical History:  Procedure Laterality Date  . HERNIA REPAIR  1979   inguinal  . SPINE SURGERY  1999   disk    SOCIAL HISTORY:   Social History   Socioeconomic History  . Marital status: Married    Spouse name: Not on file  . Number of children: Not on file  . Years of education: Not on file  . Highest education level: Not on file  Occupational History  .  Occupation: retired    Comment: Lawyer  Social Needs  . Financial resource strain: Not on file  . Food insecurity:    Worry: Not on file    Inability: Not on file  . Transportation needs:    Medical: Not on file    Non-medical: Not on file  Tobacco Use  . Smoking status: Never Smoker  . Smokeless tobacco: Never Used  Substance and Sexual Activity  . Alcohol use: No  . Drug use: No  . Sexual activity: Not on file  Lifestyle  . Physical activity:    Days per week: Not on file    Minutes per session: Not on file  . Stress: Not on file  Relationships  . Social connections:    Talks on phone: Not on file    Gets together: Not on file    Attends religious service: Not on  file    Active member of club or organization: Not on file    Attends meetings of clubs or organizations: Not on file    Relationship status: Not on file  . Intimate partner violence:    Fear of current or ex partner: Not on file    Emotionally abused: Not on file    Physically abused: Not on file    Forced sexual activity: Not on file  Other Topics Concern  . Not on file  Social History Narrative  . Not on file    FAMILY HISTORY:   Family Status  Relation Name Status  . Mother  Deceased  . Father  Deceased  . Daughter 1 Alive  . Annamarie Major  (Not Specified)  . Brother  Alive    ROS:  Review of Systems  Constitutional: Negative.   HENT: Negative.   Eyes: Negative.   Respiratory: Negative.   Cardiovascular: Negative.   Genitourinary: Negative.   Musculoskeletal: Negative.   Skin: Negative.      PHYSICAL EXAMINATION:    VITALS:   Vitals:   11/03/17 0750  BP: 138/70  Pulse: 70  SpO2: 99%  Weight: 183 lb (83 kg)  Height: 6\' 2"  (1.88 m)    GEN:  The patient appears stated age and is in NAD. HEENT:  Normocephalic, atraumatic.  The mucous membranes are moist. The superficial temporal arteries are without ropiness or tenderness. CV:  RRR Lungs:  CTAB Neck/HEME:  There are no carotid bruits bilaterally.  Neurological examination:  Orientation: The patient is alert and oriented x3. Cranial nerves: There is good facial symmetry. The speech is fluent and clear. Soft palate rises symmetrically and there is no tongue deviation. Hearing is intact to conversational tone. Sensation: Sensation is intact to light touch throughout Motor: Strength is 5/5 in the bilateral upper and lower extremities.   Shoulder shrug is equal and symmetric.  There is no pronator drift.  Movement examination: Tone: There is mod to severe increased tone in the RUE/RLE.  There is normal tone on the L Abnormal movements: There is near constant RUE resting tremor Coordination:  There is  decremation with any form of RAMS, including alternating supination and pronation of the forearm, hand opening and closing, finger taps, heel taps and toe taps on the right and with toe taps bilaterally Gait and Station: The patient has minimal difficulty arising out of a deep-seated chair without the use of the hands. The patient shuffles and has decreased arm swing bilaterally.  There is a negative pull test  Lab Results  Component Value Date  TSH 1.12 09/04/2016     Chemistry      Component Value Date/Time   NA 140 09/04/2016 0947   K 5.0 09/04/2016 0947   CL 104 09/04/2016 0947   CO2 29 09/04/2016 0947   BUN 19 09/04/2016 0947   CREATININE 1.03 09/04/2016 0947      Component Value Date/Time   CALCIUM 9.9 09/04/2016 0947   ALKPHOS 61 09/04/2016 0947   AST 20 09/04/2016 0947   ALT 16 09/04/2016 0947   BILITOT 0.5 09/04/2016 0947       ASSESSMENT/PLAN:  1.  Idiopathic Parkinson's disease.  The patient has tremor, bradykinesia, rigidity and mild postural instability.  New dx:  10/03/16  -long talk about need for medication.  He is pretty rigid on the right.  Discussed fall risk.    -pt refuses medication despite long talk.  Discussed r/b/se  -talked about regular dermatology visit due to increased risk of melanoma.  Has another appt in Jan.  -talked about importance of sunscreen 2.  F/u in 6 months.  Much greater than 50% of this visit was spent in counseling and coordinating care.  Total face to face time:  25 min   Cc:  Eulas Post, MD

## 2017-11-03 ENCOUNTER — Ambulatory Visit (INDEPENDENT_AMBULATORY_CARE_PROVIDER_SITE_OTHER): Payer: PPO | Admitting: Neurology

## 2017-11-03 ENCOUNTER — Encounter: Payer: Self-pay | Admitting: Neurology

## 2017-11-03 VITALS — BP 138/70 | HR 70 | Ht 74.0 in | Wt 183.0 lb

## 2017-11-03 DIAGNOSIS — G20 Parkinson's disease: Secondary | ICD-10-CM

## 2017-11-06 ENCOUNTER — Other Ambulatory Visit: Payer: Self-pay | Admitting: Family Medicine

## 2017-11-19 ENCOUNTER — Ambulatory Visit (INDEPENDENT_AMBULATORY_CARE_PROVIDER_SITE_OTHER): Payer: PPO | Admitting: Family Medicine

## 2017-11-19 ENCOUNTER — Encounter: Payer: Self-pay | Admitting: Family Medicine

## 2017-11-19 ENCOUNTER — Other Ambulatory Visit: Payer: Self-pay

## 2017-11-19 VITALS — BP 120/78 | HR 64 | Temp 97.6°F | Ht 74.0 in | Wt 185.1 lb

## 2017-11-19 DIAGNOSIS — Z0000 Encounter for general adult medical examination without abnormal findings: Secondary | ICD-10-CM | POA: Diagnosis not present

## 2017-11-19 DIAGNOSIS — Z23 Encounter for immunization: Secondary | ICD-10-CM

## 2017-11-19 LAB — HEPATIC FUNCTION PANEL
ALT: 12 U/L (ref 0–53)
AST: 16 U/L (ref 0–37)
Albumin: 4.4 g/dL (ref 3.5–5.2)
Alkaline Phosphatase: 53 U/L (ref 39–117)
BILIRUBIN DIRECT: 0.1 mg/dL (ref 0.0–0.3)
BILIRUBIN TOTAL: 0.5 mg/dL (ref 0.2–1.2)
TOTAL PROTEIN: 6.6 g/dL (ref 6.0–8.3)

## 2017-11-19 LAB — CBC WITH DIFFERENTIAL/PLATELET
BASOS PCT: 0.5 % (ref 0.0–3.0)
Basophils Absolute: 0 10*3/uL (ref 0.0–0.1)
EOS ABS: 0.1 10*3/uL (ref 0.0–0.7)
Eosinophils Relative: 1 % (ref 0.0–5.0)
HEMATOCRIT: 41.5 % (ref 39.0–52.0)
HEMOGLOBIN: 14.1 g/dL (ref 13.0–17.0)
LYMPHS PCT: 34.2 % (ref 12.0–46.0)
Lymphs Abs: 1.7 10*3/uL (ref 0.7–4.0)
MCHC: 33.9 g/dL (ref 30.0–36.0)
MCV: 93.5 fl (ref 78.0–100.0)
MONOS PCT: 11.2 % (ref 3.0–12.0)
Monocytes Absolute: 0.6 10*3/uL (ref 0.1–1.0)
NEUTROS ABS: 2.6 10*3/uL (ref 1.4–7.7)
Neutrophils Relative %: 53.1 % (ref 43.0–77.0)
PLATELETS: 256 10*3/uL (ref 150.0–400.0)
RBC: 4.44 Mil/uL (ref 4.22–5.81)
RDW: 12.8 % (ref 11.5–15.5)
WBC: 4.9 10*3/uL (ref 4.0–10.5)

## 2017-11-19 LAB — LIPID PANEL
CHOLESTEROL: 194 mg/dL (ref 0–200)
HDL: 48.9 mg/dL (ref >39.00)
NonHDL: 145.59
Total CHOL/HDL Ratio: 4
Triglycerides: 279 mg/dL — ABNORMAL HIGH (ref 0.0–149.0)
VLDL: 55.8 mg/dL — ABNORMAL HIGH (ref 0.0–40.0)

## 2017-11-19 LAB — BASIC METABOLIC PANEL
BUN: 15 mg/dL (ref 6–23)
CO2: 30 mEq/L (ref 19–32)
CREATININE: 0.97 mg/dL (ref 0.40–1.50)
Calcium: 10 mg/dL (ref 8.4–10.5)
Chloride: 104 mEq/L (ref 96–112)
GFR: 81.09 mL/min (ref >60.00)
GLUCOSE: 86 mg/dL (ref 70–99)
Potassium: 4.3 mEq/L (ref 3.5–5.1)
Sodium: 141 mEq/L (ref 135–145)

## 2017-11-19 LAB — TSH: TSH: 1.95 u[IU]/mL (ref 0.35–4.50)

## 2017-11-19 LAB — LDL CHOLESTEROL, DIRECT: LDL DIRECT: 111 mg/dL

## 2017-11-19 LAB — PSA: PSA: 0.48 ng/mL (ref 0.10–4.00)

## 2017-11-19 MED ORDER — LISINOPRIL 20 MG PO TABS: 20.0000 mg | ORAL_TABLET | Freq: Every day | ORAL | 3 refills | Status: DC

## 2017-11-19 NOTE — Patient Instructions (Signed)
We will call you with lab results  You received flu vaccine and Prevnar 13.

## 2017-11-19 NOTE — Progress Notes (Signed)
Subjective:     Patient ID: Bryce Wood, male   DOB: [birth year], 72 y.o.   MRN: [id number]  HPI Patient is seen for physical exam.  His chronic problems include history of osteoarthritis, hypertension, Parkinson's disease, hyperlipidemia.  He has decided against taking any medications for his Parkinson's disease at this time.  He is working part-time at [employer] course.  Doing well overall.  Needs flu vaccine and also Prevnar 13.  He states he had tetanus booster January 2019.  He had Cologuard 2017.  Hepatitis C screen last year negative  Past Medical History:  Diagnosis Date  . ARTHRITIS 08/01/2008  . HYPERLIPIDEMIA 11/21/2008  . HYPERTENSION 08/01/2008  . OSTEOARTHRITIS 08/01/2008   Past Surgical History:  Procedure Laterality Date  . HERNIA REPAIR  1979   inguinal  . [place]   disk    reports that he has never smoked. He has never used smokeless tobacco. He reports that he does not drink alcohol or use drugs. family history includes Cancer (age of onset: 19) in his paternal uncle; Heart attack in his mother; Heart disease in his mother; Hypertension in his brother and father; Leukemia in his father; Stroke in his father. No Known Allergies   Review of Systems  Constitutional: Negative for activity change, appetite change, fatigue, fever and unexpected weight change.  HENT: Negative for congestion, ear pain and trouble swallowing.   Eyes: Negative for pain and visual disturbance.  Respiratory: Negative for cough, shortness of breath and wheezing.   Cardiovascular: Negative for chest pain and palpitations.  Gastrointestinal: Negative for abdominal distention, abdominal pain, blood in stool, constipation, diarrhea, nausea, rectal pain and vomiting.  Genitourinary: Negative for dysuria, hematuria and testicular pain.  Musculoskeletal: Positive for arthralgias. Negative for joint swelling.  Skin: Negative for rash.  Neurological: Positive for tremors. Negative  for dizziness, syncope and headaches.  Hematological: Negative for adenopathy.  Psychiatric/Behavioral: Negative for confusion and dysphoric mood.       Objective:   Physical Exam  Constitutional: He is oriented to person, place, and time. He appears well-developed and well-nourished. No distress.  HENT:  Head: Normocephalic and atraumatic.  Right Ear: External ear normal.  Left Ear: External ear normal.  Mouth/Throat: Oropharynx is clear and moist.  Eyes: Pupils are equal, round, and reactive to light. Conjunctivae and EOM are normal.  Neck: Normal range of motion. Neck supple. No thyromegaly present.  Cardiovascular: Normal rate, regular rhythm and normal heart sounds.  No murmur heard. Pulmonary/Chest: No respiratory distress. He has no wheezes. He has no rales.  Abdominal: Soft. Bowel sounds are normal. He exhibits no distension and no mass. There is no tenderness. There is no rebound and no guarding.  Musculoskeletal: He exhibits no edema.  Lymphadenopathy:    He has no cervical adenopathy.  Neurological: He is alert and oriented to person, place, and time. He displays normal reflexes. No cranial nerve deficit.  Patient has tremor associated with his Parkinson's disease  Skin: No rash noted.  Psychiatric: He has a normal mood and affect.       Assessment:     Physical exam.  The following health maintenance issues were addressed    Plan:     -Flu vaccine and Prevnar 13 given -Consider repeat Cologuard by next year -Patient had tetanus January 2019 -The natural history of prostate cancer and ongoing controversy regarding screening and potential treatment outcomes of prostate cancer has been discussed with the patient. The meaning of a  false positive PSA and a false negative PSA has been discussed. He indicates understanding of the limitations of this screening test and wishes to proceed with screening PSA testing.  Eulas Post MD [facility] at  [facility]

## 2017-11-21 ENCOUNTER — Telehealth: Payer: Self-pay | Admitting: Family Medicine

## 2017-11-21 NOTE — Telephone Encounter (Signed)
Copied from [place] [phone]. Topic: General - Other >> Nov 21, 2017  2:27 PM Janace Aris A wrote: Reason for CRM: pt returned call to have his lab results disclosed.

## 2017-11-21 NOTE — Telephone Encounter (Signed)
Charted in result notes. 

## 2017-12-17 ENCOUNTER — Telehealth: Payer: Self-pay

## 2017-12-17 NOTE — Telephone Encounter (Signed)
Call to Mr. Morrisette, Marcus for 12/24 and he will call in January to reschedule  Bryce Fines rN

## 2018-01-06 ENCOUNTER — Ambulatory Visit: Payer: PPO

## 2018-01-27 ENCOUNTER — Encounter: Payer: Self-pay | Admitting: Family Medicine

## 2018-01-27 ENCOUNTER — Ambulatory Visit (INDEPENDENT_AMBULATORY_CARE_PROVIDER_SITE_OTHER): Payer: PPO | Admitting: Family Medicine

## 2018-01-27 ENCOUNTER — Other Ambulatory Visit: Payer: Self-pay

## 2018-01-27 VITALS — BP 114/76 | HR 72 | Temp 98.3°F | Ht 74.0 in | Wt 188.7 lb

## 2018-01-27 DIAGNOSIS — L84 Corns and callosities: Secondary | ICD-10-CM

## 2018-01-27 DIAGNOSIS — L603 Nail dystrophy: Secondary | ICD-10-CM | POA: Diagnosis not present

## 2018-01-27 DIAGNOSIS — N478 Other disorders of prepuce: Secondary | ICD-10-CM

## 2018-01-27 NOTE — Progress Notes (Signed)
Subjective:     Patient ID: Bryce Wood, male   DOB: 10/28/1945, 73 y.o.   MRN: [id number]  HPI Patient is seen for the following 3 items which are new complaints  First is some irritation near the tip of the penis involving the distal foreskin.  He is uncircumcised.  He states that for several years he has been unable to fully retract.  He has no difficulty with urination.  No burning with urination.  He feels that it may be some splits in the skin with the distal foreskin.  When he does retract to urinate he has some soreness.  He has not had any itching.  No discharge.  No whitish discoloration.  Second issue is he has callused area left second toe.  This is along the distal aspect of the toe.  He does have some soreness with ambulation.  No recent change of shoe wear  Third issue is he has a split nail involving the right fourth toenail.  He had recent pedicure but does not think this was related any injury with that.  Problem is that a piece of the nail is sticking up and catching on his socks frequently  He has Parkinson's disease and has had some progressive tremor but has decided against any medications at this time.  He still plays golf fairly regularly  Past Medical History:  Diagnosis Date  . ARTHRITIS 08/01/2008  . HYPERLIPIDEMIA 11/21/2008  . HYPERTENSION 08/01/2008  . OSTEOARTHRITIS 08/01/2008   Past Surgical History:  Procedure Laterality Date  . HERNIA REPAIR  1979   inguinal  . [name]   disk    reports that he has never smoked. He has never used smokeless tobacco. He reports that he does not drink alcohol or use drugs. family history includes Cancer (age of onset: 37) in his paternal uncle; Heart attack in his mother; Heart disease in his mother; Hypertension in his brother and father; Leukemia in his father; Stroke in his father. No Known Allergies   Review of Systems  Constitutional: Negative for chills and fever.  Genitourinary: Negative for  difficulty urinating, discharge, dysuria, hematuria, penile swelling and scrotal swelling.       Objective:   Physical Exam Constitutional:      Appearance: Normal appearance.  Cardiovascular:     Rate and Rhythm: Normal rate and regular rhythm.  Pulmonary:     Effort: Pulmonary effort is normal.     Breath sounds: Normal breath sounds.  Skin:    Comments: Patient has some hardened callus along the distal aspect of the left second toe.  No ulceration.  He has a split in the right fourth toenail which goes all the way to the base.  He had a piece that was sticking up and catching on his sock and were able to trim this back with some nail tremors.  No evidence for ingrown nail  Patient is uncircumcised.  Portion of glans observed appears normal.  He is unable to fully retract foreskin but he has been unable to do so for some time.  No visible discharge.  No erythema  Neurological:     Mental Status: He is alert.        Assessment:     #1 distal foreskin irritation.  This appears to be cracking and splitting when he retracts back to urinate.  There is no evidence for balanitis or yeast infection.  #2 callus left second toe  #3 split toenail right fourth toe  Plan:     -Suggested he use little bit of Vaseline with the distal foreskin to try to help with retraction.  Follow-up for any redness or discharge -Using a #15 blade we gently trimmed back hard callused area left second toe -We used some nail tremors and clipped back a portion of nail that was sticking up vertically and catching on his sock involving the right fourth toenail  Bryce Post MD [facility] at [facility]

## 2018-01-27 NOTE — Patient Instructions (Signed)
Try some vaseline to see if this can reduce friction and splitting of skin with retraction.

## 2018-01-28 ENCOUNTER — Ambulatory Visit: Payer: PPO | Admitting: Neurology

## 2018-01-29 DIAGNOSIS — L819 Disorder of pigmentation, unspecified: Secondary | ICD-10-CM | POA: Diagnosis not present

## 2018-01-29 DIAGNOSIS — D225 Melanocytic nevi of trunk: Secondary | ICD-10-CM | POA: Diagnosis not present

## 2018-01-29 DIAGNOSIS — L570 Actinic keratosis: Secondary | ICD-10-CM | POA: Diagnosis not present

## 2018-01-29 DIAGNOSIS — L821 Other seborrheic keratosis: Secondary | ICD-10-CM | POA: Diagnosis not present

## 2018-01-29 DIAGNOSIS — Z85828 Personal history of other malignant neoplasm of skin: Secondary | ICD-10-CM | POA: Diagnosis not present

## 2018-02-05 DIAGNOSIS — H52223 Regular astigmatism, bilateral: Secondary | ICD-10-CM | POA: Diagnosis not present

## 2018-02-05 DIAGNOSIS — H5203 Hypermetropia, bilateral: Secondary | ICD-10-CM | POA: Diagnosis not present

## 2018-02-05 DIAGNOSIS — H524 Presbyopia: Secondary | ICD-10-CM | POA: Diagnosis not present

## 2018-04-20 ENCOUNTER — Ambulatory Visit: Payer: PPO | Admitting: Neurology

## 2018-05-15 ENCOUNTER — Telehealth: Payer: Self-pay | Admitting: *Deleted

## 2018-05-15 NOTE — Telephone Encounter (Signed)
Left message to offer virtual AWV

## 2018-07-27 NOTE — Progress Notes (Signed)
Bryce Wood was seen today in the movement disorders clinic for neurologic consultation at the request of Eulas Post, MD.  This patient is accompanied in the office by his spouse who supplements the history.  The consultation is for the evaluation of R hand tremor x 1 year.  The records that were made available to me were reviewed.     04/02/16 update: Patient is seen today in follow-up.  He is accompanied by his wife, who supplements the history.  He was seen last September and diagnosed with Parkinson's disease.  He was very overwhelmed with the diagnosis.  Refused medication, PD rehab and community services.  However, he reports that he is going to a personal trainer one time per week, and plays golf 3-4 times week.  Records indicate he told the nurse at [facility] that he didn't want medication because didn't want to prolong the burden on family.  The records that were made available to me were reviewed.  Saw PCP in January.  09/22/17 update: Patient is seen today in follow-up for Parkinson's disease.  He has had some cramping in the hands.  He is "about" ready to take medication but wants to take a trip to [place] first and wants to wait until next visit.  He is on no medication, which was by his choice.  He has had no falls since last visit.  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  Mood has been good.  No lightheadedness or near syncope.  No hallucinations.  Records are reviewed since last visit.  He saw Dr. Tamala Julian in May and June for knee pain.  He has a knee brace now and knee pain is better.   11/03/17 update: Patient is seen today in follow-up for Parkinson's disease, accompanied by his wife who supplements history.  Patient has chosen not to take medication at this point.  He reports that he has been doing great.  He is noting dexterity issues in dominant hand.  Having more trouble with cutting food.    He has had no falls since last visit.  No  lightheadedness or near syncope.  07/30/18 update: Patient seen today in follow-up for Parkinson's disease.  Patient is on no medication, by his choice.  He had a fall - "I tripped on my feet in a tight spot."  He didn't get hurt.  He is playing golf.  He rides the course.  "my tremor is in the left hand now."   Denies any lightheadedness or near syncope.  Had a trainer but hasn't been able to go because of the pandemic.  He last saw his primary care physician on January 27, 2018.  I have reviewed those records.  PREVIOUS MEDICATIONS: none to date  ALLERGIES:  No Known Allergies  CURRENT MEDICATIONS:  Outpatient Encounter Medications as of 07/30/2018  Medication Sig  . acetaminophen (TYLENOL) 650 MG CR tablet Take 650 mg by mouth 3 (three) times daily.  . cholecalciferol (VITAMIN D) 1000 UNITS tablet Take 2,000 Units by mouth daily.  . fish oil-omega-3 fatty acids 1000 MG capsule Take 1 g by mouth daily.   Marland Kitchen lisinopril (PRINIVIL,ZESTRIL) 20 MG tablet Take 1 tablet (20 mg total) by mouth daily.  . TURMERIC PO Take by mouth.  . ALPRAZolam (XANAX) 0.5 MG tablet TAKE 1 TABLET 1 HOUR PRIOR TO DENTAL APPOINTMENT   No facility-administered encounter medications on file as of 07/30/2018.  PAST MEDICAL HISTORY:   Past Medical History:  Diagnosis Date  . ARTHRITIS 08/01/2008  . HYPERLIPIDEMIA 11/21/2008  . HYPERTENSION 08/01/2008  . OSTEOARTHRITIS 08/01/2008    PAST SURGICAL HISTORY:   Past Surgical History:  Procedure Laterality Date  . HERNIA REPAIR  1979   inguinal  . SPINE SURGERY  1999   disk    SOCIAL HISTORY:   Social History   Socioeconomic History  . Marital status: Married    Spouse name: Not on file  . Number of children: 1  . Years of education: Not on file  . Highest education level: Some college, no degree  Occupational History  . Occupation: retired    Comment: Lawyer  Social Needs  . Financial resource strain: Not on file  . Food insecurity     Worry: Not on file    Inability: Not on file  . Transportation needs    Medical: Not on file    Non-medical: Not on file  Tobacco Use  . Smoking status: Never Smoker  . Smokeless tobacco: Never Used  Substance and Sexual Activity  . Alcohol use: No  . Drug use: No  . Sexual activity: Not on file  Lifestyle  . Physical activity    Days per week: Not on file    Minutes per session: Not on file  . Stress: Not on file  Relationships  . Social Herbalist on phone: Not on file    Gets together: Not on file    Attends religious service: Not on file    Active member of club or organization: Not on file    Attends meetings of clubs or organizations: Not on file    Relationship status: Not on file  . Intimate partner violence    Fear of current or ex partner: Not on file    Emotionally abused: Not on file    Physically abused: Not on file    Forced sexual activity: Not on file  Other Topics Concern  . Not on file  Social History Narrative  . Not on file    FAMILY HISTORY:   Family Status  Relation Name Status  . Mother  Deceased  . Father  Deceased  . Daughter 1 Alive  . Annamarie Major  (Not Specified)  . Brother  Alive    ROS:  ROS   PHYSICAL EXAMINATION:    VITALS:   Vitals:   07/30/18 0838  BP: (!) 151/75  Pulse: 61  Temp: 97.9 F (36.6 C)  SpO2: 98%  Weight: 179 lb (81.2 kg)  Height: 6' 0.5" (1.842 m)   GEN:  The patient appears stated age and is in NAD. HEENT:  Normocephalic, atraumatic.  The mucous membranes are moist. The superficial temporal arteries are without ropiness or tenderness. CV:  RRR Lungs:  CTAB Neck/HEME:  There are no carotid bruits bilaterally.  Neurological examination:  Orientation: The patient is alert and oriented x3. Cranial nerves: There is good facial symmetry. The speech is fluent and clear. Soft palate rises symmetrically and there is no tongue deviation. Hearing is intact to conversational tone. Sensation: Sensation  is intact to light touch throughout Motor: Strength is 5/5 in the bilateral upper and lower extremities.   Shoulder shrug is equal and symmetric.  There is no pronator drift.   Movement examination: Tone: There is mod to severe increased tone in the RUE/RLE (same as prior)  There is mod increased tone in the LUE (much worse)and  normal in the LLE.   Abnormal movements: There is near constant RUE resting tremor and intermittent on the LUE Coordination:  There is decremation with any form of RAMS, including alternating supination and pronation of the forearm, hand opening and closing, finger taps, heel taps and toe taps on the right and with toe taps bilaterally Gait and Station: The patient has minimal difficulty arising out of a deep-seated chair without the use of the hands. The patient is shuffling and is short stepped.    Lab Results  Component Value Date   TSH 1.95 11/19/2017     Chemistry      Component Value Date/Time   NA 141 11/19/2017 0753   K 4.3 11/19/2017 0753   CL 104 11/19/2017 0753   CO2 30 11/19/2017 0753   BUN 15 11/19/2017 0753   CREATININE 0.97 11/19/2017 0753      Component Value Date/Time   CALCIUM 10.0 11/19/2017 0753   ALKPHOS 53 11/19/2017 0753   AST 16 11/19/2017 0753   ALT 12 11/19/2017 0753   BILITOT 0.5 11/19/2017 0753       ASSESSMENT/PLAN:  1.  Idiopathic Parkinson's disease.  The patient has tremor, bradykinesia, rigidity and mild postural instability.  New dx:  10/03/16  -another long talk with the patient.  He had many questions and I answered them to the best of my ability.  Pt has become quite rigid, slow and shuffling.  I do recommend medication.  We went through the r/b/se in detail.  He was ultimately agreeable.  We will slowly work up to carbidopa/levodopa 25/100 three times per day.  Risks, benefits, side effects and alternative therapies were discussed.  The opportunity to ask questions was given and they were answered to the best of my  ability.  The patient expressed understanding and willingness to follow the outlined treatment protocols.  -talked about online exercise programs.  He wasn't interested in RSB due to cost.  Pt information given 2.  Follow up is anticipated in the next 4-6 months, sooner should new neurologic issues arise.  Much greater than 50% of this visit was spent in counseling and coordinating care.  Total face to face time:  25 min   Cc:  Eulas Post, MD

## 2018-07-29 ENCOUNTER — Ambulatory Visit: Payer: PPO | Admitting: Neurology

## 2018-07-30 ENCOUNTER — Other Ambulatory Visit: Payer: Self-pay

## 2018-07-30 ENCOUNTER — Encounter: Payer: Self-pay | Admitting: Neurology

## 2018-07-30 ENCOUNTER — Ambulatory Visit (INDEPENDENT_AMBULATORY_CARE_PROVIDER_SITE_OTHER): Payer: PPO | Admitting: Neurology

## 2018-07-30 VITALS — BP 151/75 | HR 61 | Temp 97.9°F | Ht 72.5 in | Wt 179.0 lb

## 2018-07-30 DIAGNOSIS — L821 Other seborrheic keratosis: Secondary | ICD-10-CM | POA: Diagnosis not present

## 2018-07-30 DIAGNOSIS — G20 Parkinson's disease: Secondary | ICD-10-CM | POA: Diagnosis not present

## 2018-07-30 DIAGNOSIS — I8393 Asymptomatic varicose veins of bilateral lower extremities: Secondary | ICD-10-CM | POA: Diagnosis not present

## 2018-07-30 DIAGNOSIS — D225 Melanocytic nevi of trunk: Secondary | ICD-10-CM | POA: Diagnosis not present

## 2018-07-30 DIAGNOSIS — D1801 Hemangioma of skin and subcutaneous tissue: Secondary | ICD-10-CM | POA: Diagnosis not present

## 2018-07-30 DIAGNOSIS — L570 Actinic keratosis: Secondary | ICD-10-CM | POA: Diagnosis not present

## 2018-07-30 DIAGNOSIS — L819 Disorder of pigmentation, unspecified: Secondary | ICD-10-CM | POA: Diagnosis not present

## 2018-07-30 MED ORDER — CARBIDOPA-LEVODOPA 25-100 MG PO TABS: 1.0000 | ORAL_TABLET | Freq: Three times a day (TID) | ORAL | 1 refills | Status: DC

## 2018-07-30 NOTE — Patient Instructions (Signed)
Start Carbidopa Levodopa as follows:  Take 1/2 tablet three times daily, at least 30 minutes before meals, for one week  Then take 1/2 tablet in the morning, 1/2 tablet in the afternoon, 1 tablet in the evening, at least 30 minutes before meals, for one week  Then take 1/2 tablet in the morning, 1 tablet in the afternoon, 1 tablet in the evening, at least 30 minutes before meals, for one week  Then take 1 tablet three times daily (7am/11am/4pm), at least 30 minutes before meals   As a reminder, carbidopa/levodopa can be taken at the same time as a carbohydrate, but we like to have you take your pill either 30 minutes before a protein source or 1 hour after as protein can interfere with carbidopa/levodopa absorption.  You may try the [facility] classes on [address] [facility] TV) and the [employer]! Moves videos on You Tube (each position is a separate video done by Gray Bernhardt, in the teal blue outfit) as good first options for exercise if you cannot get out of the house to exercise. Check out the PWR! Move of the month on the [place] website - [email or web address] - [email or web address]  (Click on the resources tab to access PWR! Move of the month-you should be able to access previous months as well)    In addition, [email or web address] has online classes that are being streamed for Parkinsons.   You should also try [facility] website where you  can learn about programs available virtually Monday-Friday from the [place] that focus on everything from mindfulness to fitness to education and more.

## 2018-08-05 ENCOUNTER — Telehealth: Payer: Self-pay | Admitting: Neurology

## 2018-08-05 NOTE — Telephone Encounter (Signed)
Noted Please be aware patient is no longer taken this medication

## 2018-08-05 NOTE — Telephone Encounter (Signed)
New Message  Pt c/o medication issue:  1. Name of Medication:  carbidopa-levodopa (Sinemet IR) 25-100 mg tablet 3 times daily  2. How are you currently taking this medication (dosage and times per day)? See above  3. Are you having a reaction (difficulty breathing--STAT)? No  4. What is your medication issue? Patient verbalized he had decided to not take medication due to side effects and he wants [place] to document he is not taking it unless he really needs to, so at this time he is not.  Please f/u if needed

## 2018-08-10 ENCOUNTER — Telehealth: Payer: Self-pay | Admitting: Neurology

## 2018-08-10 NOTE — Telephone Encounter (Signed)
Noted patient was to start carbidopa-levodopa titration See last office note. Last telephone visit patient refused to take now ready to start Spoke with patient confirm that he is taken as prescribe by provider

## 2018-08-10 NOTE — Telephone Encounter (Signed)
Patient left vm that he did in fact start new medication on 08/09/18. Just wanted to let yall know since he said originally that he wasn't going to start new medication. He did not give name of meds. Thanks!

## 2018-08-13 DIAGNOSIS — N481 Balanitis: Secondary | ICD-10-CM | POA: Diagnosis not present

## 2018-08-13 DIAGNOSIS — N471 Phimosis: Secondary | ICD-10-CM | POA: Diagnosis not present

## 2018-09-24 DIAGNOSIS — N481 Balanitis: Secondary | ICD-10-CM | POA: Diagnosis not present

## 2018-09-24 DIAGNOSIS — N471 Phimosis: Secondary | ICD-10-CM | POA: Diagnosis not present

## 2018-11-22 ENCOUNTER — Other Ambulatory Visit: Payer: Self-pay | Admitting: Family Medicine

## 2018-11-24 ENCOUNTER — Encounter: Payer: PPO | Admitting: Family Medicine

## 2018-12-21 ENCOUNTER — Encounter: Payer: Self-pay | Admitting: Family Medicine

## 2018-12-21 ENCOUNTER — Other Ambulatory Visit: Payer: Self-pay

## 2018-12-21 ENCOUNTER — Ambulatory Visit (INDEPENDENT_AMBULATORY_CARE_PROVIDER_SITE_OTHER): Payer: PPO | Admitting: Family Medicine

## 2018-12-21 VITALS — BP 134/78 | HR 58 | Temp 98.0°F | Ht 70.75 in | Wt 183.0 lb

## 2018-12-21 DIAGNOSIS — Z0000 Encounter for general adult medical examination without abnormal findings: Secondary | ICD-10-CM

## 2018-12-21 LAB — CBC WITH DIFFERENTIAL/PLATELET
Basophils Absolute: 0 10*3/uL (ref 0.0–0.1)
Basophils Relative: 0.7 % (ref 0.0–3.0)
Eosinophils Absolute: 0 10*3/uL (ref 0.0–0.7)
Eosinophils Relative: 1 % (ref 0.0–5.0)
HCT: 41.6 % (ref 39.0–52.0)
Hemoglobin: 14 g/dL (ref 13.0–17.0)
Lymphocytes Relative: 32.1 % (ref 12.0–46.0)
Lymphs Abs: 1.6 10*3/uL (ref 0.7–4.0)
MCHC: 33.6 g/dL (ref 30.0–36.0)
MCV: 93.5 fl (ref 78.0–100.0)
Monocytes Absolute: 0.6 10*3/uL (ref 0.1–1.0)
Monocytes Relative: 12.1 % — ABNORMAL HIGH (ref 3.0–12.0)
Neutro Abs: 2.7 10*3/uL (ref 1.4–7.7)
Neutrophils Relative %: 54.1 % (ref 43.0–77.0)
Platelets: 280 10*3/uL (ref 150.0–400.0)
RBC: 4.44 Mil/uL (ref 4.22–5.81)
RDW: 12.6 % (ref 11.5–15.5)
WBC: 4.9 10*3/uL (ref 4.0–10.5)

## 2018-12-21 LAB — BASIC METABOLIC PANEL
BUN: 21 mg/dL (ref 6–23)
CO2: 29 mEq/L (ref 19–32)
Calcium: 10 mg/dL (ref 8.4–10.5)
Chloride: 102 mEq/L (ref 96–112)
Creatinine, Ser: 1.02 mg/dL (ref 0.40–1.50)
GFR: 71.78 mL/min (ref >60.00)
Glucose, Bld: 74 mg/dL (ref 70–99)
Potassium: 4.4 mEq/L (ref 3.5–5.1)
Sodium: 139 mEq/L (ref 135–145)

## 2018-12-21 LAB — LIPID PANEL
Cholesterol: 195 mg/dL (ref 0–200)
HDL: 53.1 mg/dL (ref >39.00)
LDL Cholesterol: 118 mg/dL — ABNORMAL HIGH (ref 0–99)
NonHDL: 141.82
Total CHOL/HDL Ratio: 4
Triglycerides: 118 mg/dL (ref 0.0–149.0)
VLDL: 23.6 mg/dL (ref 0.0–40.0)

## 2018-12-21 LAB — TSH: TSH: 1.03 u[IU]/mL (ref 0.35–4.50)

## 2018-12-21 LAB — HEPATIC FUNCTION PANEL
ALT: 4 U/L (ref 0–53)
AST: 15 U/L (ref 0–37)
Albumin: 4.5 g/dL (ref 3.5–5.2)
Alkaline Phosphatase: 56 U/L (ref 39–117)
Bilirubin, Direct: 0.1 mg/dL (ref 0.0–0.3)
Total Bilirubin: 0.6 mg/dL (ref 0.2–1.2)
Total Protein: 6.8 g/dL (ref 6.0–8.3)

## 2018-12-21 MED ORDER — LISINOPRIL 20 MG PO TABS: 20.0000 mg | ORAL_TABLET | Freq: Every day | ORAL | 3 refills | Status: DC

## 2018-12-21 NOTE — Patient Instructions (Signed)
Preventive Care 75 Years and Older, Male Preventive care refers to lifestyle choices and visits with your health care provider that can promote health and wellness. This includes:  A yearly physical exam. This is also called an annual well check.  Regular dental and eye exams.  Immunizations.  Screening for certain conditions.  Healthy lifestyle choices, such as diet and exercise. What can I expect for my preventive care visit? Physical exam Your health care provider will check:  Height and weight. These may be used to calculate body mass index (BMI), which is a measurement that tells if you are at a healthy weight.  Heart rate and blood pressure.  Your skin for abnormal spots. Counseling Your health care provider may ask you questions about:  Alcohol, tobacco, and drug use.  Emotional well-being.  Home and relationship well-being.  Sexual activity.  Eating habits.  History of falls.  Memory and ability to understand (cognition).  Work and work Statistician. What immunizations do I need?  Influenza (flu) vaccine  This is recommended every year. Tetanus, diphtheria, and pertussis (Tdap) vaccine  You may need a Td booster every 10 years. Varicella (chickenpox) vaccine  You may need this vaccine if you have not already been vaccinated. Zoster (shingles) vaccine  You may need this after age 50. Pneumococcal conjugate (PCV13) vaccine  One dose is recommended after age 24. Pneumococcal polysaccharide (PPSV23) vaccine  One dose is recommended after age 33. Measles, mumps, and rubella (MMR) vaccine  You may need at least one dose of MMR if you were born in 1957 or later. You may also need a second dose. Meningococcal conjugate (MenACWY) vaccine  You may need this if you have certain conditions. Hepatitis A vaccine  You may need this if you have certain conditions or if you travel or work in places where you may be exposed to hepatitis A. Hepatitis B vaccine   You may need this if you have certain conditions or if you travel or work in places where you may be exposed to hepatitis B. Haemophilus influenzae type b (Hib) vaccine  You may need this if you have certain conditions. You may receive vaccines as individual doses or as more than one vaccine together in one shot (combination vaccines). Talk with your health care provider about the risks and benefits of combination vaccines. What tests do I need? Blood tests  Lipid and cholesterol levels. These may be checked every 5 years, or more frequently depending on your overall health.  Hepatitis C test.  Hepatitis B test. Screening  Lung cancer screening. You may have this screening every year starting at age 74 if you have a 30-pack-year history of smoking and currently smoke or have quit within the past 15 years.  Colorectal cancer screening. All adults should have this screening starting at age 57 and continuing until age 54. Your health care provider may recommend screening at age 47 if you are at increased risk. You will have tests every 1-10 years, depending on your results and the type of screening test.  Prostate cancer screening. Recommendations will vary depending on your family history and other risks.  Diabetes screening. This is done by checking your blood sugar (glucose) after you have not eaten for a while (fasting). You may have this done every 1-3 years.  Abdominal aortic aneurysm (AAA) screening. You may need this if you are a current or former smoker.  Sexually transmitted disease (STD) testing. Follow these instructions at home: Eating and drinking  Eat  a diet that includes fresh fruits and vegetables, whole grains, lean protein, and low-fat dairy products. Limit your intake of foods with high amounts of sugar, saturated fats, and salt.  Take vitamin and mineral supplements as recommended by your health care provider.  Do not drink alcohol if your health care provider  tells you not to drink.  If you drink alcohol: ? Limit how much you have to 0-2 drinks a day. ? Be aware of how much alcohol is in your drink. In the [place] one drink equals one 12 oz bottle of beer (355 mL), one 5 oz glass of wine (148 mL), or one 1 oz glass of hard liquor (44 mL). Lifestyle  Take daily care of your teeth and gums.  Stay active. Exercise for at least 30 minutes on 5 or more days each week.  Do not use any products that contain nicotine or tobacco, such as cigarettes, e-cigarettes, and chewing tobacco. If you need help quitting, ask your health care provider.  If you are sexually active, practice safe sex. Use a condom or other form of protection to prevent STIs (sexually transmitted infections).  Talk with your health care provider about taking a low-dose aspirin or statin. What's next?  Visit your health care provider once a year for a well check visit.  Ask your health care provider how often you should have your eyes and teeth checked.  Stay up to date on all vaccines. This information is not intended to replace advice given to you by your health care provider. Make sure you discuss any questions you have with your health care provider. Document Released: 01/27/2015 Document Revised: 12/25/2017 Document Reviewed: 12/25/2017 Elsevier Patient Education  2020 Elsevier Inc.  

## 2018-12-21 NOTE — Progress Notes (Signed)
Subjective:     Patient ID: Bryce Wood, male   DOB: 02/07/1945, 73 y.o.   MRN: [id number]  HPI Bryce Wood has history of Parkinson's disease, hypertension, osteoarthritis.  He is seen today for physical exam.  He remains on Sinemet.  No recent orthostatic symptoms.  Blood pressures treated with lisinopril 20 mg daily.  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  No recent falls.  Flu vaccine given back in September.  He has not had shingles vaccine but has had clinical shingles in the past.  He declines vaccine today.  His only medications are Sinemet and lisinopril.  Health Maintenance  Topic Date Due  . COLONOSCOPY  06/24/2019 (Originally 11/22/1996)  . TETANUS/TDAP  02/23/2027  . INFLUENZA VACCINE  Completed  . Hepatitis C Screening  Completed  . PNA vac Low Risk Adult  Completed   Past Medical History:  Diagnosis Date  . ARTHRITIS 08/01/2008  . HYPERLIPIDEMIA 11/21/2008  . HYPERTENSION 08/01/2008  . OSTEOARTHRITIS 08/01/2008   Past Surgical History:  Procedure Laterality Date  . HERNIA REPAIR  1979   inguinal  . [place]   disk    reports that he has never smoked. He has never used smokeless tobacco. He reports that he does not drink alcohol or use drugs. family history includes Cancer (age of onset: 20) in his paternal uncle; Healthy in his daughter; Heart attack in his mother; Heart disease in his mother; Hypertension in his brother and father; Leukemia in his father; Stroke in his father. No Known Allergies  Wt Readings from Last 3 Encounters:  12/21/18 183 lb (83 kg)  07/30/18 179 lb (81.2 kg)  01/27/18 188 lb 11.2 oz (85.6 kg)     Review of Systems  Constitutional: Negative for activity change, appetite change, fatigue and fever.  HENT: Negative for congestion, ear pain and trouble swallowing.   Eyes: Negative for pain and visual disturbance.  Respiratory: Negative for cough, chest tightness,  shortness of breath and wheezing.   Cardiovascular: Negative for chest pain, palpitations and leg swelling.  Gastrointestinal: Negative for abdominal distention, abdominal pain, blood in stool, constipation, diarrhea, nausea, rectal pain and vomiting.  Genitourinary: Negative for dysuria, hematuria and testicular pain.  Musculoskeletal: Negative for arthralgias and joint swelling.  Skin: Negative for rash.  Neurological: Positive for tremors. Negative for dizziness, syncope, weakness, light-headedness and headaches.  Hematological: Negative for adenopathy.  Psychiatric/Behavioral: Negative for confusion and dysphoric mood.       Objective:   Physical Exam Constitutional:      Appearance: He is well-developed.  HENT:     Right Ear: External ear normal.     Left Ear: External ear normal.  Eyes:     Pupils: Pupils are equal, round, and reactive to light.  Neck:     Musculoskeletal: Neck supple.     Thyroid: No thyromegaly.  Cardiovascular:     Rate and Rhythm: Normal rate and regular rhythm.  Pulmonary:     Effort: Pulmonary effort is normal. No respiratory distress.     Breath sounds: Normal breath sounds. No wheezing or rales.  Musculoskeletal:     Right lower leg: No edema.     Left lower leg: No edema.  Neurological:     Mental Status: He is alert and oriented to person, place, and time.     Comments: Significant resting tremor related to his Parkinson's disease.  Right upper extremity  greater than left No focal weakness        Assessment:     Physical exam.  Patient has Parkinson's disease followed by neurology.  He has hypertension stable on lisinopril 20 mg daily.  Standing blood pressure by me left arm 134/78.  He is not reporting any orthostatic changes.  We discussed the following health maintenance issues    Plan:     -Obtain follow-up screening labs.  We discussed pros and cons of PSA screening and he declines -Discussed shingles vaccine but he declines at this  time -Flu vaccine already given -Continue regular exercise habits -Refill lisinopril for 1 year  Bryce Post MD [facility]

## 2018-12-22 DIAGNOSIS — C44319 Basal cell carcinoma of skin of other parts of face: Secondary | ICD-10-CM | POA: Diagnosis not present

## 2018-12-22 DIAGNOSIS — L578 Other skin changes due to chronic exposure to nonionizing radiation: Secondary | ICD-10-CM | POA: Diagnosis not present

## 2018-12-22 DIAGNOSIS — D485 Neoplasm of uncertain behavior of skin: Secondary | ICD-10-CM | POA: Diagnosis not present

## 2018-12-31 ENCOUNTER — Ambulatory Visit: Payer: PPO | Admitting: Neurology

## 2019-01-15 DIAGNOSIS — C801 Malignant (primary) neoplasm, unspecified: Secondary | ICD-10-CM

## 2019-01-15 HISTORY — DX: Malignant (primary) neoplasm, unspecified: C80.1

## 2019-01-24 ENCOUNTER — Other Ambulatory Visit: Payer: Self-pay | Admitting: Neurology

## 2019-02-03 ENCOUNTER — Telehealth: Payer: Self-pay | Admitting: Neurology

## 2019-02-03 NOTE — Telephone Encounter (Signed)
Patient returned your call. Thank you

## 2019-02-03 NOTE — Telephone Encounter (Signed)
Left message to call office back

## 2019-02-03 NOTE — Telephone Encounter (Signed)
Pt is aware.  

## 2019-02-03 NOTE — Telephone Encounter (Signed)
I would encourage him to get it.

## 2019-02-03 NOTE — Telephone Encounter (Signed)
From his provider standpoint is there any reason he could not get the vaccine?

## 2019-02-03 NOTE — Telephone Encounter (Signed)
Patient is wanting to know if he can get the COVID vaccine. Thanks!

## 2019-02-12 ENCOUNTER — Ambulatory Visit: Payer: PPO

## 2019-02-17 DIAGNOSIS — C44319 Basal cell carcinoma of skin of other parts of face: Secondary | ICD-10-CM | POA: Diagnosis not present

## 2019-02-24 ENCOUNTER — Telehealth: Payer: Self-pay | Admitting: Family Medicine

## 2019-02-24 NOTE — Telephone Encounter (Addendum)
Patient got Covid vaccine on 02/12/19 and states his site of injection is still red and sore.  He is asking if it should still be red and sore after this long after vaccine?  He can be reached after 4:00 today.

## 2019-02-24 NOTE — Telephone Encounter (Signed)
Spoke with patient.  No fever or chills.  .  Explained not that unusual to have soreness this long after but would follow up for any progressive redness, swelling, etc.

## 2019-02-24 NOTE — Telephone Encounter (Signed)
Please see message. °

## 2019-03-04 ENCOUNTER — Telehealth: Payer: Self-pay | Admitting: Family Medicine

## 2019-03-04 NOTE — Telephone Encounter (Signed)
Left message for patient to call back and schedule Medicare Annual Wellness Visit (AWV) either virtually,audio only or in person (whichever the patient prefers--45 MINUTES).  Last AWV 12.21.18; please schedule at anytime with [facility]-Nurse Health Advisor at [facility] [place].

## 2019-03-17 DIAGNOSIS — L570 Actinic keratosis: Secondary | ICD-10-CM | POA: Diagnosis not present

## 2019-03-29 ENCOUNTER — Other Ambulatory Visit: Payer: Self-pay | Admitting: Urology

## 2019-03-29 DIAGNOSIS — N481 Balanitis: Secondary | ICD-10-CM | POA: Diagnosis not present

## 2019-03-29 DIAGNOSIS — N471 Phimosis: Secondary | ICD-10-CM | POA: Diagnosis not present

## 2019-03-31 NOTE — Progress Notes (Signed)
Assessment/Plan:   1.  Parkinsons Disease, diagnosed in 2018, levodopa started in July, 2020  -increase carbidopa/levodopa 25/100 from 1 po tid to 2/2/1  -We discussed that it used to be thought that levodopa would increase risk of melanoma but now it is believed that Parkinsons itself likely increases risk of melanoma. he is to get regular skin checks.  He just had a basal cell removed so is following with derm  -exercise.   Subjective:   Bryce Wood was seen today in follow up for Parkinsons disease.  My previous records were reviewed prior to todays visit as well as outside records available to me.  Started on levodopa last visit.  Patient reports that he mentally feels better but otherwise feels about the same.  pt had one fall 4-5 months ago.  He was getting out of the car and tripped.  He didn't get hurt.  Pt denies lightheadedness, near syncope.  No hallucinations.  Mood has been pretty good but he is worried about a circumcision next week.  Just had a basal cell removed on the face  Current prescribed movement disorder medications: Carbidopa/levodopa 25/100, 1 tablet 3 times per day.   PREVIOUS MEDICATIONS: Sinemet  ALLERGIES:  No Known Allergies  CURRENT MEDICATIONS:  Outpatient Encounter Medications as of 04/02/2019  Medication Sig  . acetaminophen (TYLENOL) 650 MG CR tablet Take 650 mg by mouth 3 (three) times daily.  Marland Kitchen amoxicillin (AMOXIL) 500 MG tablet Take 500 mg by mouth in the morning, at noon, and at bedtime.  . carbidopa-levodopa (SINEMET IR) 25-100 MG tablet TAKE 1 TABLET BY MOUTH THREE TIMES A DAY  . cholecalciferol (VITAMIN D) 1000 UNITS tablet Take 2,000 Units by mouth daily.  . fish oil-omega-3 fatty acids 1000 MG capsule Take 1 g by mouth daily.   Marland Kitchen lisinopril (ZESTRIL) 20 MG tablet Take 1 tablet (20 mg total) by mouth daily.  . TURMERIC PO Take 1 tablet by mouth in the morning and at bedtime.   . [DISCONTINUED] ALPRAZolam (XANAX) 0.5 MG tablet TAKE 1  TABLET 1 HOUR PRIOR TO DENTAL APPOINTMENT   No facility-administered encounter medications on file as of 04/02/2019.    Objective:   PHYSICAL EXAMINATION:    VITALS:   Vitals:   04/02/19 0805  BP: (!) 152/84  Pulse: 66  SpO2: 100%  Weight: 183 lb (83 kg)  Height: 6\' 1"  (1.854 m)    GEN:  The patient appears stated age and is in NAD. HEENT:  Normocephalic, atraumatic.  The mucous membranes are moist. The superficial temporal arteries are without ropiness or tenderness. CV:  RRR Lungs:  CTAB Neck/HEME:  There are no carotid bruits bilaterally.  Neurological examination:  Orientation: The patient is alert and oriented x3. Cranial nerves: There is good facial symmetry with  facial hypomimia. The speech is fluent and clear. Soft palate rises symmetrically and there is no tongue deviation. Hearing is intact to conversational tone. Sensation: Sensation is intact to light touch throughout Motor: Strength is at least antigravity x4.  Movement examination: Tone: There is mod increased tone in the RUE and min to mild in the LUE (improved Abnormal movements: there is near constant RUE rest tremor Coordination:  There is  decremation with RAM's, with any form of RAMS, including alternating supination and pronation of the forearm, hand opening and closing, finger taps, heel taps and toe taps on the L Gait and Station: The patient has no difficulty arising out of a deep-seated chair without the  use of the hands. The patient's stride length is decreased with flexed posture.   I have reviewed and interpreted the following labs independently     Chemistry      Component Value Date/Time   NA 139 12/21/2018 0921   K 4.4 12/21/2018 0921   CL 102 12/21/2018 0921   CO2 29 12/21/2018 0921   BUN 21 12/21/2018 0921   CREATININE 1.02 12/21/2018 0921      Component Value Date/Time   CALCIUM 10.0 12/21/2018 0921   ALKPHOS 56 12/21/2018 0921   AST 15 12/21/2018 0921   ALT 4 12/21/2018 0921    BILITOT 0.6 12/21/2018 0921        Total time spent on today's visit was 40 minutes, including both face-to-face time and nonface-to-face time.  Time included that spent on review of records (prior notes available to me/labs/imaging if pertinent), discussing treatment and goals, answering patient's questions and coordinating care.  Cc:  Eulas Post, MD

## 2019-04-02 ENCOUNTER — Other Ambulatory Visit: Payer: Self-pay

## 2019-04-02 ENCOUNTER — Ambulatory Visit: Payer: PPO | Admitting: Neurology

## 2019-04-02 ENCOUNTER — Encounter: Payer: Self-pay | Admitting: Neurology

## 2019-04-02 VITALS — BP 152/84 | HR 66 | Ht 73.0 in | Wt 183.0 lb

## 2019-04-02 DIAGNOSIS — G20 Parkinson's disease: Secondary | ICD-10-CM | POA: Diagnosis not present

## 2019-04-02 MED ORDER — CARBIDOPA-LEVODOPA 25-100 MG PO TABS: ORAL_TABLET | ORAL | 1 refills | Status: DC

## 2019-04-02 NOTE — Patient Instructions (Addendum)
1.  Take carbidopa/levodopa 25/100, 2 at 6:30 am/2 at 10:30am/1 at 4pm  The physicians and staff at [facility] are committed to providing excellent care. You may receive a survey requesting feedback about your experience at our office. We strive to receive "very good" responses to the survey questions. If you feel that your experience would prevent you from giving the office a "very good " response, please contact our office to try to remedy the situation. We may be reached at [phone]. Thank you for taking the time out of your busy day to complete the survey.

## 2019-04-15 ENCOUNTER — Other Ambulatory Visit: Payer: Self-pay

## 2019-04-15 ENCOUNTER — Encounter (HOSPITAL_BASED_OUTPATIENT_CLINIC_OR_DEPARTMENT_OTHER): Payer: Self-pay | Admitting: Urology

## 2019-04-15 NOTE — Progress Notes (Signed)
Spoke w/ via phone for pre-op interview---patient Lab needs dos--- I stat 8, ekg             COVID test ------ 04-19-2019 850 am Arrive at -------530 am  NPO after ------midnight Medications to take morning of surgery -----patient wishes to take no medictions am of surgery Diabetic medication -----n/a Patient Special Instructions -----none Pre-Op special Istructions -----none Patient verbalized understanding of instructions that were given at this phone interview. Patient denies shortness of breath, chest pain, fever, cough a this phone interview.

## 2019-04-19 ENCOUNTER — Other Ambulatory Visit (HOSPITAL_COMMUNITY)
Admission: RE | Admit: 2019-04-19 | Discharge: 2019-04-19 | Disposition: A | Discharge status: Home / Self Care | Payer: PPO | Source: Ambulatory Visit | Attending: Urology | Admitting: Urology

## 2019-04-19 DIAGNOSIS — Z01812 Encounter for preprocedural laboratory examination: Secondary | ICD-10-CM | POA: Insufficient documentation

## 2019-04-19 DIAGNOSIS — Z20822 Contact with and (suspected) exposure to covid-19: Secondary | ICD-10-CM | POA: Insufficient documentation

## 2019-04-19 LAB — SARS CORONAVIRUS 2 (TAT 6-24 HRS): SARS Coronavirus 2: NEGATIVE

## 2019-04-21 NOTE — H&P (Signed)
I have trouble rolling my foreskin back.     Bryce Wood returns today in f/u for his history of phimosis with balanitis. I have recommended a circumcision but he had gotten enough benefit from nystatin that he declined the procedure at his visit 6 months ago. He has had increased difficulty retracting the foreskin because of progressive tremors from the Parkinson's disease. He is voiding with an IPSS of 10. He has some frequency and urgency but not much bother.     CC: AUA Questions Scoring.  HPI: Bryce Wood is a 74 year-old male established patient who is here for evaluation.      AUA Symptom Score: 50% of the time he has the sensation of not emptying his bladder completely when finished urinating. 50% of the time he has to urinate again fewer than two hours after he has finished urinating. Less than 50% of the time he has to start and stop again several times when he urinates. He never finds it difficult to postpone urination. Less than 20% of the time he has a weak urinary stream. He never has to push or strain to begin urination. He has to get up to urinate 1 time from the time he goes to bed until the time he gets up in the morning.   Calculated AUA Symptom Score: 10    ALLERGIES: No Allergies    MEDICATIONS: Lisinopril 20 mg tablet  Acetaminophen  Alprazolam 0.5 mg tablet  Amoxicillin 1 PO Daily  Carbidopa-Levodopa 25 mg-100 mg tablet  Fish Oil  Nystatin 100,000 unit/gram ointment apply a small amount to the foreskin 2-3x daily  Turmeric  Vitamin D3     GU PSH: No GU PSH      PSH Notes: Inguinal Hernia Repair, Back Surgery   NON-GU PSH: Back surgery Hernia Repair     GU PMH: Phimosis, He is doing better with reduce phimosis and inflammation but I think he is going to need to continue the nystatin. he will continue to use it and adjust the frequency according to his response. I did recommend circumcision again as the definitive option. He will return in 6 months. -  09/24/2018, He has moderate to severe phimosis with balanitis. I think he will eventually need a circumcision but a trial of nystatin ointment is reasonable first. I will sent that and have him return in 4-6 weeks for reevaluation. , - 08/13/2018 Balanitis - 08/13/2018 ED due to arterial insufficiency, Erectile dysfunction due to arterial insufficiency - 2014 Obstructive and reflux uropathy, Unspec, Obstructive uropathy - 2014 Peyronies Disease, Peyronie's Disease - 2014 Spermatocele of epididymis, Unspec, Spermatocele - 2014 Testicular pain, unspecified, Testicular pain - 2014      PMH Notes:  2008-08-19 14:15:24 - Note: Arthritis   NON-GU PMH: Personal history of other diseases of the circulatory system, History of hypertension - 2014 Personal history of other endocrine, nutritional and metabolic disease, History of hypercholesterolemia - 2014 Encounter for general adult medical examination without abnormal findings, Encounter for preventive health examination - 2010 Arthritis Hypertension Parkinson''s disease Skin Cancer, History    FAMILY HISTORY: Arthritis - Mother Cancer - Outlook Status Number - Mother Heart Attack - Mother Hypertension - Father Prostate Cancer - Runs in Family stroke - Father   SOCIAL HISTORY: Marital Status: Married Preferred Language: English; Race: White Current Smoking Status: Patient has never smoked.   Tobacco Use Assessment Completed: Used Tobacco in last 30 days?     Notes: 1 daughter    REVIEW  OF SYSTEMS:    GU Review Male:   Patient denies frequent urination, hard to postpone urination, burning/ pain with urination, get up at night to urinate, leakage of urine, stream starts and stops, trouble starting your stream, have to strain to urinate , erection problems, and penile pain.  Gastrointestinal (Upper):   Patient denies nausea, vomiting, and indigestion/ heartburn.  Gastrointestinal (Lower):   Patient denies diarrhea and  constipation.  Constitutional:   Patient denies fever, night sweats, weight loss, and fatigue.  Skin:   Patient denies skin rash/ lesion and itching.  Eyes:   Patient denies blurred vision and double vision.  Ears/ Nose/ Throat:   Patient denies sore throat and sinus problems.  Hematologic/Lymphatic:   Patient denies swollen glands and easy bruising.  Cardiovascular:   Patient denies leg swelling and chest pains.  Respiratory:   Patient denies cough and shortness of breath.  Endocrine:   Patient denies excessive thirst.  Musculoskeletal:   Patient denies joint pain and back pain.  Neurological:   Patient denies headaches and dizziness.  Psychologic:   Patient denies depression and anxiety.   VITAL SIGNS:      03/29/2019 08:12 AM  Weight 175 lb / 79.38 kg  Height 73 in / 185.42 cm  BP 140/69 mmHg  Pulse 67 /min  Temperature 98.6 F / 37 C  BMI 23.1 kg/m   GU PHYSICAL EXAMINATION:    Scrotum: No lesions. No edema. No cysts. No warts.  Epididymides: Right: no spermatocele, no masses, no cysts, no tenderness, no induration, no enlargement. Left: no spermatocele, no masses, no cysts, no tenderness, no induration, no enlargement.  Testes: No tenderness, no swelling, no enlargement left testes. No tenderness, no swelling, no enlargement right testes. Normal location left testes. Normal location right testes. No mass, no cyst, no varicocele, no hydrocele left testes. No mass, no cyst, no varicocele, no hydrocele right testes.  Penis: Penis uncircumcised, severe phimosis. Balanitis. No foreskin warts, no cracks. No dorsal peyronie's plaques, no left corporal peyronie's plaques, no right corporal peyronie's plaques, no scarring, no shaft warts. No meatal stenosis.    MULTI-SYSTEM PHYSICAL EXAMINATION:    Constitutional: Well-nourished. No physical deformities. Normally developed. Good grooming.  Respiratory: No labored breathing, no use of accessory muscles.      PAST DATA REVIEWED:  Source  Of History:  Patient  Records Review:   AUA Symptom Score, Previous Patient Records  Urine Test Review:   Urinalysis   11/21/04  Hormones  Testosterone, Total 2.63     PROCEDURES:          Urinalysis - 81003 Dipstick Dipstick Cont'd  Specimen: Voided Bilirubin: Neg  Color: Yellow Ketones: Neg  Appearance: Clear Blood: Neg  Specific Gravity: 1.025 Protein: Neg  pH: 5.0 Urobilinogen: 0.2  Glucose: Neg Nitrites: Neg    Leukocyte Esterase: Neg    ASSESSMENT:      ICD-10 Details  1 GU:   Phimosis - N47.1 Chronic, Worsening - He has progressive phimosis with persistent balanitis and needs circumcision. I have reviewed the risks of bleeding, infection, penile scarring, penile sensory changes, persistent excess skin, insufficient skin, meatal stenosis, thrombotic events and anesthetic complications.   2   Balanitis - N48.1 Chronic, Stable   PLAN:           Schedule Return Visit/Planned Activity: ASAP - Schedule Surgery

## 2019-04-22 ENCOUNTER — Encounter (HOSPITAL_BASED_OUTPATIENT_CLINIC_OR_DEPARTMENT_OTHER): Payer: Self-pay | Admitting: Urology

## 2019-04-22 ENCOUNTER — Ambulatory Visit (HOSPITAL_BASED_OUTPATIENT_CLINIC_OR_DEPARTMENT_OTHER)
Admission: RE | Admit: 2019-04-22 | Discharge: 2019-04-22 | Disposition: A | Discharge status: Home / Self Care | Payer: PPO | Attending: Urology | Admitting: Urology

## 2019-04-22 ENCOUNTER — Ambulatory Visit (HOSPITAL_BASED_OUTPATIENT_CLINIC_OR_DEPARTMENT_OTHER): Payer: PPO | Admitting: Anesthesiology

## 2019-04-22 ENCOUNTER — Encounter (HOSPITAL_BASED_OUTPATIENT_CLINIC_OR_DEPARTMENT_OTHER)
Admission: RE | Disposition: A | Discharge status: Home / Self Care | Payer: Self-pay | Source: Home / Self Care | Attending: Urology

## 2019-04-22 DIAGNOSIS — I10 Essential (primary) hypertension: Secondary | ICD-10-CM | POA: Diagnosis not present

## 2019-04-22 DIAGNOSIS — E785 Hyperlipidemia, unspecified: Secondary | ICD-10-CM | POA: Diagnosis not present

## 2019-04-22 DIAGNOSIS — Z79899 Other long term (current) drug therapy: Secondary | ICD-10-CM | POA: Insufficient documentation

## 2019-04-22 DIAGNOSIS — N481 Balanitis: Secondary | ICD-10-CM | POA: Diagnosis not present

## 2019-04-22 DIAGNOSIS — N471 Phimosis: Secondary | ICD-10-CM | POA: Insufficient documentation

## 2019-04-22 DIAGNOSIS — G20 Parkinson's disease: Secondary | ICD-10-CM | POA: Diagnosis not present

## 2019-04-22 DIAGNOSIS — Z85828 Personal history of other malignant neoplasm of skin: Secondary | ICD-10-CM | POA: Insufficient documentation

## 2019-04-22 HISTORY — PX: CIRCUMCISION: SHX1350

## 2019-04-22 HISTORY — DX: Parkinson's disease: G20

## 2019-04-22 HISTORY — DX: Parkinson's disease without dyskinesia, without mention of fluctuations: G20.A1

## 2019-04-22 LAB — POCT I-STAT, CHEM 8
BUN: 26 mg/dL — ABNORMAL HIGH (ref 8–23)
Calcium, Ion: 1.28 mmol/L (ref 1.15–1.40)
Chloride: 105 mmol/L (ref 98–111)
Creatinine, Ser: 1.1 mg/dL (ref 0.61–1.24)
Glucose, Bld: 97 mg/dL (ref 70–99)
HCT: 42 % (ref 39.0–52.0)
Hemoglobin: 14.3 g/dL (ref 13.0–17.0)
Potassium: 4.1 mmol/L (ref 3.5–5.1)
Sodium: 143 mmol/L (ref 135–145)
TCO2: 31 mmol/L (ref 22–32)

## 2019-04-22 SURGERY — CIRCUMCISION, ADULT
Anesthesia: General | Site: Penis

## 2019-04-22 MED ORDER — FENTANYL CITRATE (PF) 100 MCG/2ML IJ SOLN
INTRAMUSCULAR | Status: DC | PRN
  Administered 2019-04-22 (×2): 25 ug via INTRAVENOUS
  Administered 2019-04-22: 50 ug via INTRAVENOUS

## 2019-04-22 MED ORDER — FENTANYL CITRATE (PF) 100 MCG/2ML IJ SOLN
INTRAMUSCULAR | Status: AC
  Filled 2019-04-22: qty 2

## 2019-04-22 MED ORDER — MIDAZOLAM HCL 2 MG/2ML IJ SOLN
INTRAMUSCULAR | Status: AC
  Filled 2019-04-22: qty 2

## 2019-04-22 MED ORDER — HYDROMORPHONE HCL 1 MG/ML IJ SOLN
0.2500 mg | INTRAMUSCULAR | Status: DC | PRN
  Filled 2019-04-22: qty 0.5

## 2019-04-22 MED ORDER — PROPOFOL 10 MG/ML IV BOLUS
INTRAVENOUS | Status: DC | PRN
  Administered 2019-04-22: 150 mg via INTRAVENOUS

## 2019-04-22 MED ORDER — LIDOCAINE HCL (PF) 1 % IJ SOLN
INTRAMUSCULAR | Status: DC | PRN
  Administered 2019-04-22: 10 mL

## 2019-04-22 MED ORDER — STERILE WATER FOR IRRIGATION IR SOLN
Status: DC | PRN
  Administered 2019-04-22: 500 mL

## 2019-04-22 MED ORDER — ONDANSETRON HCL 4 MG/2ML IJ SOLN
INTRAMUSCULAR | Status: AC
  Filled 2019-04-22: qty 2

## 2019-04-22 MED ORDER — ONDANSETRON HCL 4 MG/2ML IJ SOLN
INTRAMUSCULAR | Status: DC | PRN
  Administered 2019-04-22: 4 mg via INTRAVENOUS

## 2019-04-22 MED ORDER — BUPIVACAINE HCL (PF) 0.25 % IJ SOLN
INTRAMUSCULAR | Status: DC | PRN
  Administered 2019-04-22: 10 mL

## 2019-04-22 MED ORDER — ONDANSETRON HCL 4 MG/2ML IJ SOLN
4.0000 mg | Freq: Once | INTRAMUSCULAR | Status: DC | PRN
  Filled 2019-04-22: qty 2

## 2019-04-22 MED ORDER — PROPOFOL 10 MG/ML IV BOLUS
INTRAVENOUS | Status: AC
  Filled 2019-04-22: qty 40

## 2019-04-22 MED ORDER — MEPERIDINE HCL 25 MG/ML IJ SOLN
6.2500 mg | INTRAMUSCULAR | Status: DC | PRN
  Filled 2019-04-22: qty 1

## 2019-04-22 MED ORDER — LIDOCAINE 2% (20 MG/ML) 5 ML SYRINGE
INTRAMUSCULAR | Status: DC | PRN
  Administered 2019-04-22: 100 mg via INTRAVENOUS

## 2019-04-22 MED ORDER — HYDROCODONE-ACETAMINOPHEN 5-325 MG PO TABS: 1.0000 | ORAL_TABLET | Freq: Four times a day (QID) | ORAL | 0 refills | Status: DC | PRN

## 2019-04-22 MED ORDER — CEFAZOLIN SODIUM-DEXTROSE 2-4 GM/100ML-% IV SOLN
2.0000 g | INTRAVENOUS | Status: AC
  Administered 2019-04-22: 2 g via INTRAVENOUS
  Filled 2019-04-22: qty 100

## 2019-04-22 MED ORDER — SODIUM CHLORIDE 0.9% FLUSH
3.0000 mL | Freq: Two times a day (BID) | INTRAVENOUS | Status: DC
  Filled 2019-04-22: qty 3

## 2019-04-22 MED ORDER — LACTATED RINGERS IV SOLN
INTRAVENOUS | Status: DC
  Filled 2019-04-22: qty 1000

## 2019-04-22 MED ORDER — CEFAZOLIN SODIUM-DEXTROSE 2-4 GM/100ML-% IV SOLN
INTRAVENOUS | Status: AC
  Filled 2019-04-22: qty 100

## 2019-04-22 MED ORDER — MIDAZOLAM HCL 5 MG/5ML IJ SOLN
INTRAMUSCULAR | Status: DC | PRN
  Administered 2019-04-22: 2 mg via INTRAVENOUS

## 2019-04-22 MED ORDER — LIDOCAINE 2% (20 MG/ML) 5 ML SYRINGE
INTRAMUSCULAR | Status: AC
  Filled 2019-04-22: qty 5

## 2019-04-22 SURGICAL SUPPLY — 39 items
BLADE SURG 15 STRL LF DISP TIS (BLADE) ×1 IMPLANT
BLADE SURG 15 STRL SS (BLADE) ×3
BNDG COHESIVE 2X5 TAN STRL LF (GAUZE/BANDAGES/DRESSINGS) ×6 IMPLANT
BNDG CONFORM 2 STRL LF (GAUZE/BANDAGES/DRESSINGS) ×3 IMPLANT
CLEANER CAUTERY TIP 5X5 PAD (MISCELLANEOUS) IMPLANT
COVER BACK TABLE 60X90IN (DRAPES) ×3 IMPLANT
COVER MAYO STAND STRL (DRAPES) ×3 IMPLANT
COVER WAND RF STERILE (DRAPES) IMPLANT
DRAPE LAPAROTOMY 100X72 PEDS (DRAPES) ×3 IMPLANT
ELECT NEEDLE TIP 2.8 STRL (NEEDLE) ×3 IMPLANT
ELECT REM PT RETURN 9FT ADLT (ELECTROSURGICAL) ×3
ELECTRODE REM PT RTRN 9FT ADLT (ELECTROSURGICAL) ×1 IMPLANT
GAUZE 4X4 16PLY RFD (DISPOSABLE) ×3 IMPLANT
GAUZE SPONGE 4X4 12PLY STRL LF (GAUZE/BANDAGES/DRESSINGS) ×3 IMPLANT
GAUZE XEROFORM 1X8 LF (GAUZE/BANDAGES/DRESSINGS) ×3 IMPLANT
GLOVE BIOGEL PI IND STRL 6 (GLOVE) ×2 IMPLANT
GLOVE BIOGEL PI IND STRL 7.0 (GLOVE) ×1 IMPLANT
GLOVE BIOGEL PI INDICATOR 6 (GLOVE) ×4
GLOVE BIOGEL PI INDICATOR 7.0 (GLOVE) ×2
GLOVE OPTIFIT SS 8.5 STRL (GLOVE) ×3 IMPLANT
GLOVE SURG SS PI 8.0 STRL IVOR (GLOVE) ×3 IMPLANT
GOWN STRL REUS W/ TWL LRG LVL3 (GOWN DISPOSABLE) ×1 IMPLANT
GOWN STRL REUS W/ TWL XL LVL3 (GOWN DISPOSABLE) ×1 IMPLANT
GOWN STRL REUS W/TWL LRG LVL3 (GOWN DISPOSABLE) ×3
GOWN STRL REUS W/TWL XL LVL3 (GOWN DISPOSABLE) ×3
KIT TURNOVER CYSTO (KITS) ×3 IMPLANT
MANIFOLD NEPTUNE II (INSTRUMENTS) IMPLANT
NEEDLE HYPO 25X1 1.5 SAFETY (NEEDLE) ×3 IMPLANT
NS IRRIG 500ML POUR BTL (IV SOLUTION) IMPLANT
PACK BASIN DAY SURGERY FS (CUSTOM PROCEDURE TRAY) ×3 IMPLANT
PAD CLEANER CAUTERY TIP 5X5 (MISCELLANEOUS)
PENCIL BUTTON HOLSTER BLD 10FT (ELECTRODE) ×3 IMPLANT
SUT CHROMIC 4 0 PS 2 18 (SUTURE) ×6 IMPLANT
SYR CONTROL 10ML LL (SYRINGE) ×3 IMPLANT
TOWEL OR 17X26 10 PK STRL BLUE (TOWEL DISPOSABLE) ×3 IMPLANT
TRAY DSU PREP LF (CUSTOM PROCEDURE TRAY) ×3 IMPLANT
TUBE CONNECTING 12'X1/4 (SUCTIONS) ×1
TUBE CONNECTING 12X1/4 (SUCTIONS) ×2 IMPLANT
WATER STERILE IRR 500ML POUR (IV SOLUTION) ×3 IMPLANT

## 2019-04-22 NOTE — Transfer of Care (Signed)
Immediate Anesthesia Transfer of Care Note  Patient: Hardie Shackleton  Procedure(s) Performed: CIRCUMCISION ADULT (N/A Penis)  Patient Location: PACU  Anesthesia Type:General  Level of Consciousness: awake, drowsy and responds to stimulation  Airway & Oxygen Therapy: Patient Spontanous Breathing and Patient connected to nasal cannula oxygen  Post-op Assessment: Report given to RN and Post -op Vital signs reviewed and stable  Post vital signs: Reviewed and stable  Last Vitals:  Vitals Value Taken Time  BP 129/79 04/22/19 0817  Temp 36.4 C 04/22/19 0817  Pulse 60 04/22/19 0817  Resp 11 04/22/19 0817  SpO2 98 % 04/22/19 0817  Vitals shown include unvalidated device data.  Last Pain:  Vitals:   04/22/19 0817  TempSrc:   PainSc: Asleep      Patients Stated Pain Goal: 5 ([id number])  Complications: No apparent anesthesia complications

## 2019-04-22 NOTE — Anesthesia Preprocedure Evaluation (Signed)
Anesthesia Evaluation  Patient identified by MRN, date of birth, ID band Patient awake    Reviewed: Allergy & Precautions, NPO status , Patient's Chart, lab work & pertinent test results  Airway Mallampati: I  TM Distance: >3 FB Neck ROM: Full    Dental   Pulmonary    Pulmonary exam normal        Cardiovascular hypertension, Pt. on medications Normal cardiovascular exam     Neuro/Psych    GI/Hepatic   Endo/Other    Renal/GU      Musculoskeletal   Abdominal   Peds  Hematology   Anesthesia Other Findings   Reproductive/Obstetrics                             Anesthesia Physical Anesthesia Plan  ASA: II  Anesthesia Plan: General   Post-op Pain Management:    Induction: Intravenous  PONV Risk Score and Plan: 2 and Ondansetron and Midazolam  Airway Management Planned: LMA  Additional Equipment:   Intra-op Plan:   Post-operative Plan: Extubation in OR  Informed Consent: I have reviewed the patients History and Physical, chart, labs and discussed the procedure including the risks, benefits and alternatives for the proposed anesthesia with the patient or authorized representative who has indicated his/her understanding and acceptance.       Plan Discussed with: CRNA and Surgeon  Anesthesia Plan Comments:         Anesthesia Quick Evaluation  

## 2019-04-22 NOTE — Anesthesia Postprocedure Evaluation (Signed)
Anesthesia Post Note  Patient: Christhoper Serrette  Procedure(s) Performed: CIRCUMCISION ADULT (N/A Penis)     Patient location during evaluation: PACU Anesthesia Type: General Level of consciousness: awake and alert Pain management: pain level controlled Vital Signs Assessment: post-procedure vital signs reviewed and stable Respiratory status: spontaneous breathing, nonlabored ventilation, respiratory function stable and patient connected to nasal cannula oxygen Cardiovascular status: blood pressure returned to baseline and stable Postop Assessment: no apparent nausea or vomiting Anesthetic complications: no    Last Vitals:  Vitals:   04/22/19 0845 04/22/19 0945  BP: 98/74 137/75  Pulse: 64 60  Resp: 12 14  Temp:  36.5 C  SpO2: 95% 98%    Last Pain:  Vitals:   04/22/19 0945  TempSrc: Oral  PainSc:                  Saber Dickerman DAVID

## 2019-04-22 NOTE — Discharge Instructions (Addendum)
Circumcision-Home Care Instructions  The following instructions have been prepared to help you care for yourself upon your return home today.   Wound Care & Hygiene:   You may apply ice to the penis.  This may help to decrease swelling.  Remove the dressing tomorrow.  If the dressing falls off before then, leave it off.  You may shower or bathe in 48 hours  Gently wash the penis with soap and water.  The stitches do not need to be removed.  Activity:  Do not drive or operate any equipment today.  The effects of anesthesia are still present, drowsiness may result.  Rest today, not necessarily flat bed rest, just take it easy.  You may resume your normal activity in one to two days or as indicated by your physician.  Sexual Activity:  Erection and sexual relations should be avoided for *2 weeks.  Diet:  Drink liquids or eat a very light diet this evening.  You may resume a regular diet tomorrow.  General Expectations of your surgery:   You may have a small amount of bleeding  The penis will be swollen and bruised for approximately one week  You may wake during the night with an erection, usually this is caused by having a full bladder so you should try to urinate (pass your water) to relieve the erection or apply ice to the penis  Unexpected Observations - Call your doctor if these occur!  Persistent or heavy bleeding  Temperature of 101 degrees or more  Severe pain not relieved by medication  You may remove the dressing in the morning and if it falls off, leave it off.     Post Anesthesia Home Care Instructions  Activity: Get plenty of rest for the remainder of the day. A responsible individual must stay with you for 24 hours following the procedure.  For the next 24 hours, DO NOT: -Drive a car -Paediatric nurse -Drink alcoholic beverages -Take any medication unless instructed by your physician -Make any legal decisions or sign important papers.  Meals: Start with  liquid foods such as gelatin or soup. Progress to regular foods as tolerated. Avoid greasy, spicy, heavy foods. If nausea and/or vomiting occur, drink only clear liquids until the nausea and/or vomiting subsides. Call your physician if vomiting continues.  Special Instructions/Symptoms: Your throat may feel dry or sore from the anesthesia or the breathing tube placed in your throat during surgery. If this causes discomfort, gargle with warm salt water. The discomfort should disappear within 24 hours.

## 2019-04-22 NOTE — Op Note (Signed)
  Preoperative diagnosis: 1. Phimosis  Postoperative diagnosis:  1. Phimosis  Procedure:  1. Circumcision  Surgeon: [name]. M.D.  Anesthesia: general, local with 21ml 50/50 mix of 1% lidocaine and 0.25% marcaine penile block.   Complications: None  EBL: 25ml   Specimens: 1. foreskin  Disposition of specimens: discarded.   Indications:  This patient is to undergo circumcision for symptomatic phimosis.  Procedure:   The patient was placed in the supine position and anesthesia was induced.  The genitalia was prepped with betadine solution and he was draped in the usual sterile fashion.   A penile block was performed.  The redundant foreskin was excised using the dorsal and ventral slit technique and hemostasis was achieved with the Bovie.  The skin edges were reapproximated with a running 4-0 chromic that was tied at the quadrants.  The wound was cleaned and dried.  A dressing of Xeroform, Kling and Coban was applied.  The anesthetic was reversed and he was taken to the recovery room in stable condition.  There were no complications.

## 2019-04-22 NOTE — Interval H&P Note (Signed)
History and Physical Interval Note: No change  04/22/2019 7:21 AM  Bryce Wood  has presented today for surgery, with the diagnosis of [place].  The various methods of treatment have been discussed with the patient and family. After consideration of risks, benefits and other options for treatment, the patient has consented to  Procedure(s): CIRCUMCISION ADULT (N/A) as a surgical intervention.  The patient's history has been reviewed, patient examined, no change in status, stable for surgery.  I have reviewed the patient's chart and labs.  Questions were answered to the patient's satisfaction.     [name]

## 2019-04-22 NOTE — Anesthesia Procedure Notes (Signed)
Procedure Name: LMA Insertion Date/Time: 04/22/2019 7:35 AM Performed by: Lollie Sails, CRNA Pre-anesthesia Checklist: Patient identified, Emergency Drugs available, Suction available, Patient being monitored and Timeout performed Patient Re-evaluated:Patient Re-evaluated prior to induction Oxygen Delivery Method: Circle system utilized Preoxygenation: Pre-oxygenation with 100% oxygen Induction Type: IV induction LMA: LMA inserted LMA Size: 5.0 Number of attempts: 1 Placement Confirmation: positive ETCO2 and breath sounds checked- equal and bilateral Tube secured with: Tape Dental Injury: Teeth and Oropharynx as per pre-operative assessment

## 2019-04-23 ENCOUNTER — Telehealth: Payer: Self-pay

## 2019-04-23 NOTE — Telephone Encounter (Signed)
Pt called states he is ready to begin new medication after surgery yesterday 04/22/19. Confirmed last visit provider increased carvedopa-levodopa but did not add another medication. Confirmed pharmacy received 04/02/19. Pt verbalized understanding & will contact pharmacy.

## 2019-04-25 ENCOUNTER — Other Ambulatory Visit: Payer: Self-pay | Admitting: Neurology

## 2019-04-25 IMAGING — DX DG KNEE COMPLETE 4+V*R*
4 series · 4 of 4 positions shown · non-contrast
Comparison: None.

CLINICAL DATA: Patient with history of medial knee pain. No known
injury.

EXAM:
RIGHT KNEE - COMPLETE 4+ VIEW

[knee ap]
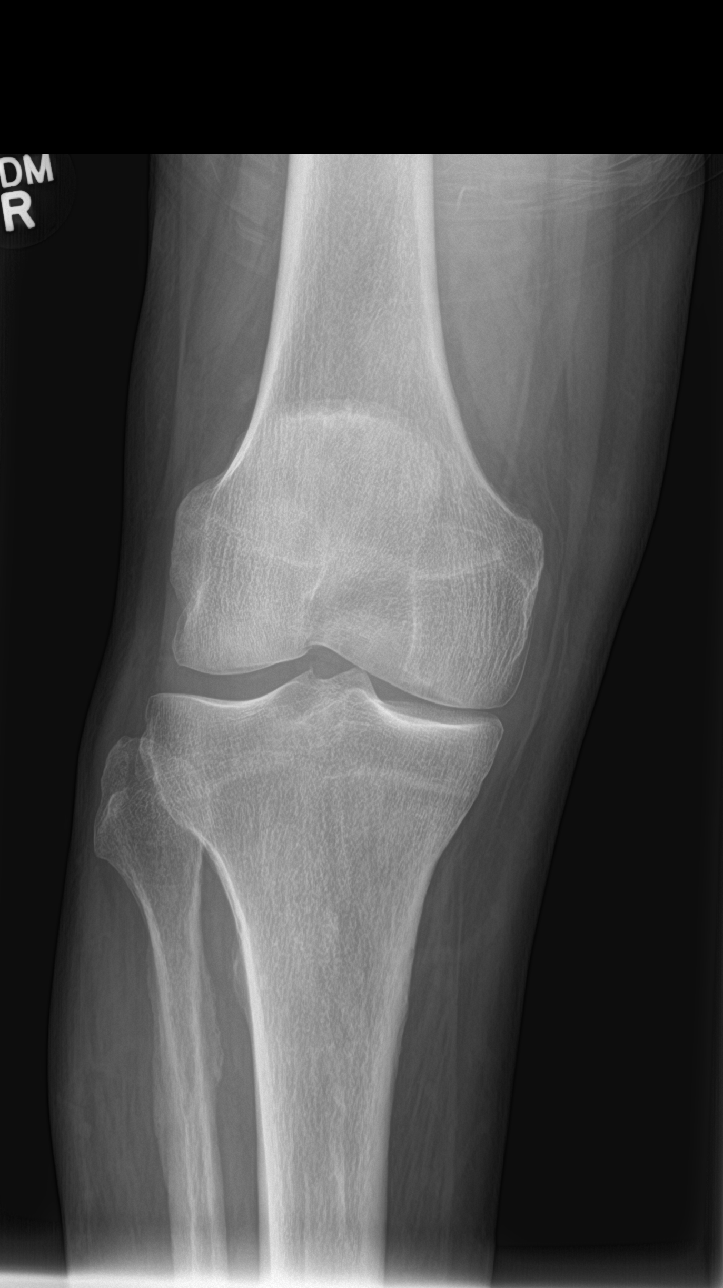

[knee tunnel]
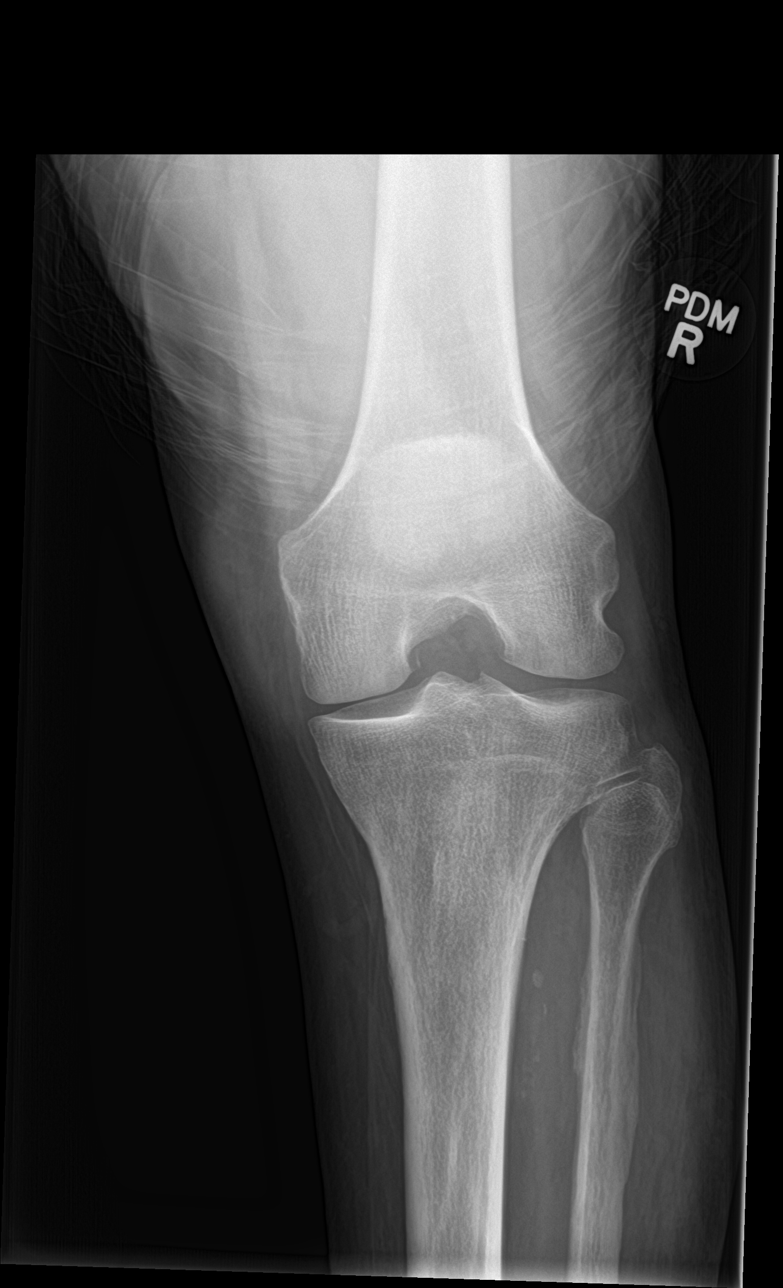

[knee lat]
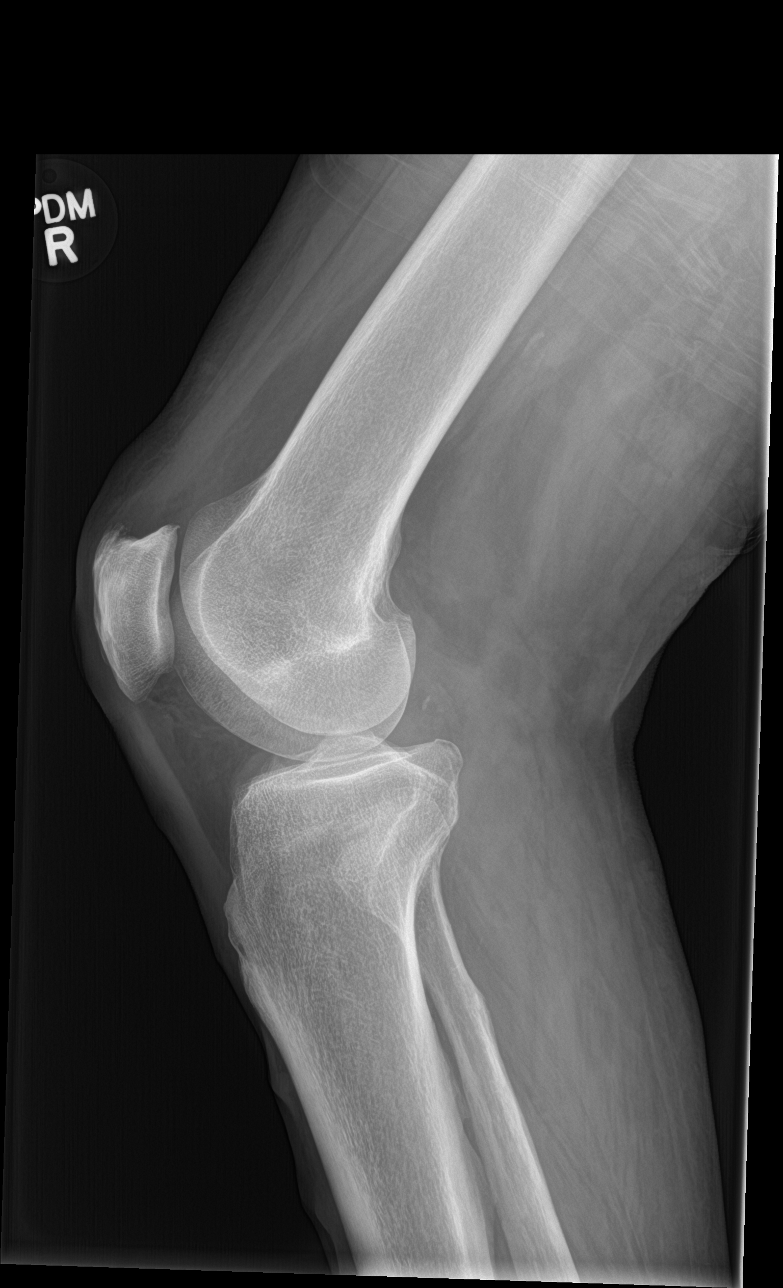

[sunrise]
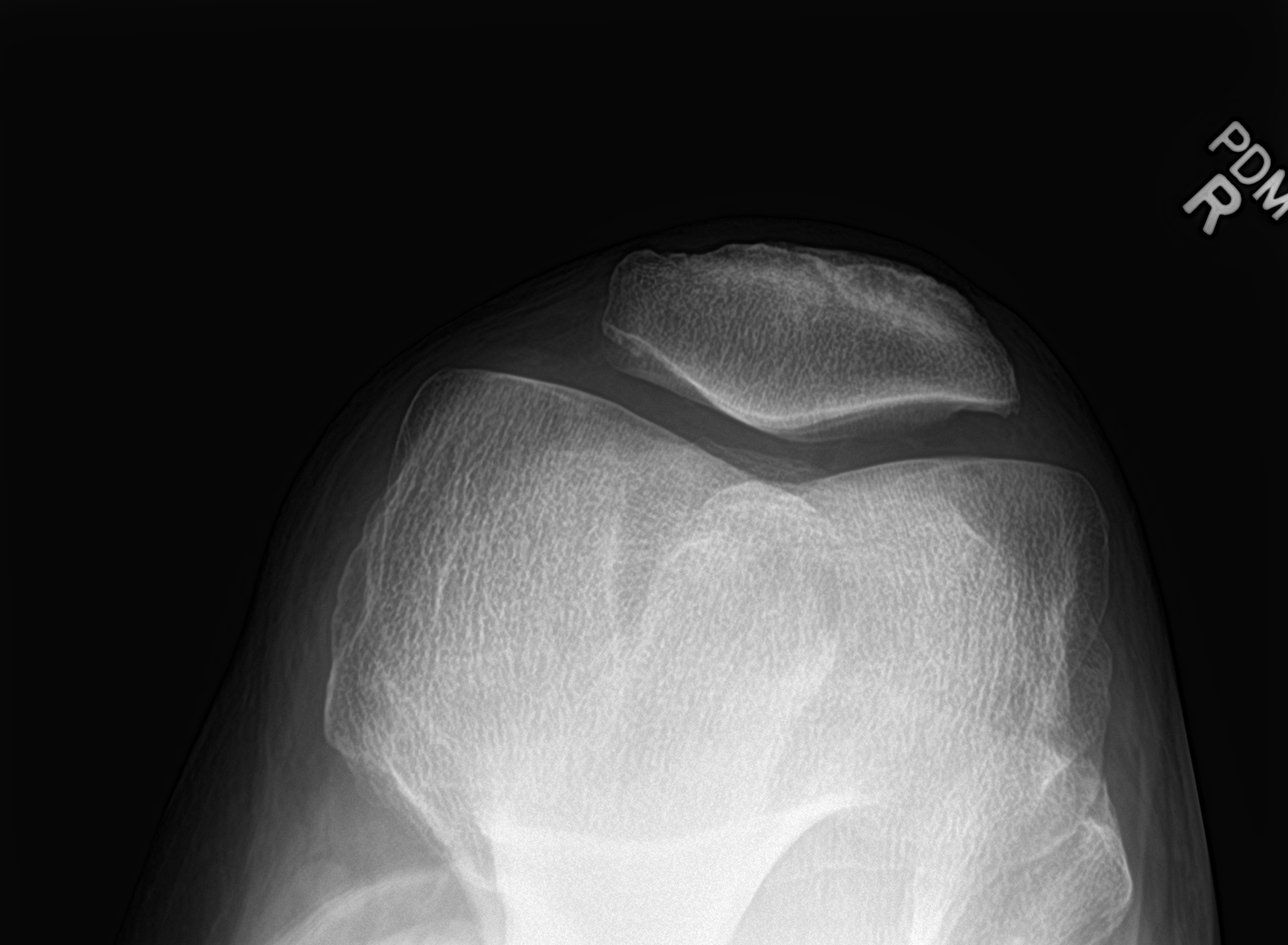

[4 of 4 positions shown; findings below may reference images not displayed]

FINDINGS: Normal anatomic alignment. Mild medial compartment joint space
narrowing. Patellofemoral compartment degenerative changes. No acute
fracture or dislocation. No joint effusion.
IMPRESSION: No acute osseous abnormality. Mild medial compartment and
patellofemoral compartment degenerative changes.

## 2019-05-06 DIAGNOSIS — N471 Phimosis: Secondary | ICD-10-CM | POA: Diagnosis not present

## 2019-05-06 DIAGNOSIS — R350 Frequency of micturition: Secondary | ICD-10-CM | POA: Diagnosis not present

## 2019-06-25 ENCOUNTER — Other Ambulatory Visit: Payer: Self-pay | Admitting: Neurology

## 2019-06-28 ENCOUNTER — Other Ambulatory Visit: Payer: Self-pay

## 2019-06-28 ENCOUNTER — Telehealth: Payer: Self-pay | Admitting: Neurology

## 2019-06-28 MED ORDER — CARBIDOPA-LEVODOPA 25-100 MG PO TABS: ORAL_TABLET | ORAL | 3 refills | Status: DC

## 2019-06-28 NOTE — Telephone Encounter (Signed)
Patient called and states that his Carbidopa levodopa needs to be refilled and he is now taking 5 pills a day. So we will need to call in a corrected RX at [facility] so that he will be able to pick up the RX today

## 2019-06-28 NOTE — Telephone Encounter (Signed)
Patients medication sent to pharmacy,

## 2019-08-05 DIAGNOSIS — L570 Actinic keratosis: Secondary | ICD-10-CM | POA: Diagnosis not present

## 2019-08-05 DIAGNOSIS — L905 Scar conditions and fibrosis of skin: Secondary | ICD-10-CM | POA: Diagnosis not present

## 2019-08-05 DIAGNOSIS — Z85828 Personal history of other malignant neoplasm of skin: Secondary | ICD-10-CM | POA: Diagnosis not present

## 2019-09-02 NOTE — Progress Notes (Signed)
Assessment/Plan:   1.  Parkinsons Disease  -Continue carbidopa/levodopa 25/100, 2/2/1  -We discussed that it used to be thought that levodopa would increase risk of melanoma but now it is believed that Parkinsons itself likely increases risk of melanoma. he is to get regular skin checks.  He just saw dermatology 2-3 weeks ago.  -exercise   Subjective:   Bryce Wood was seen today in follow up for Parkinsons disease.  My previous records were reviewed prior to todays visit as well as outside records available to me. "ive been doing great."  Pt denies falls.  Pt denies lightheadedness, near syncope.  No hallucinations.  Mood has been good.  Golfing for exercises.    Current prescribed movement disorder medications: Carbidopa/levodopa 25/100, 2/2/1 (increased last visit)   ALLERGIES:  No Known Allergies  CURRENT MEDICATIONS:  Outpatient Encounter Medications as of 09/06/2019  Medication Sig  . acetaminophen (TYLENOL) 650 MG CR tablet Take 650 mg by mouth 3 (three) times daily.  Marland Kitchen amoxicillin (AMOXIL) 500 MG tablet Take 500 mg by mouth in the morning, at noon, and at bedtime. Took for tooth extraction  . carbidopa-levodopa (SINEMET IR) 25-100 MG tablet TAKE 1 TABLET BY 5 times a day  . cholecalciferol (VITAMIN D) 1000 UNITS tablet Take 2,000 Units by mouth daily.  . fish oil-omega-3 fatty acids 1000 MG capsule Take 1 g by mouth daily.   Marland Kitchen HYDROcodone-acetaminophen (NORCO) 5-325 MG tablet Take 1 tablet by mouth every 6 (six) hours as needed for moderate pain.  Marland Kitchen lisinopril (ZESTRIL) 20 MG tablet Take 1 tablet (20 mg total) by mouth daily.  . TURMERIC PO Take 1 tablet by mouth in the morning and at bedtime.    No facility-administered encounter medications on file as of 09/06/2019.    Objective:   PHYSICAL EXAMINATION:    VITALS:   Vitals:   09/06/19 0820  BP: (!) 149/78  Pulse: 68  Weight: 185 lb (83.9 kg)  Height: 6\' 2"  (1.88 m)    GEN:  The patient appears stated age  and is in NAD. HEENT:  Normocephalic, atraumatic.  The mucous membranes are moist. The superficial temporal arteries are without ropiness or tenderness. CV:  RRR Lungs:  CTAB Neck/HEME:  There are no carotid bruits bilaterally.  Neurological examination:  Orientation: The patient is alert and oriented x3. Cranial nerves: There is good facial symmetry with facial hypomimia. The speech is fluent and clear. Soft palate rises symmetrically and there is no tongue deviation. Hearing is intact to conversational tone. Sensation: Sensation is intact to light touch throughout Motor: Strength is at least antigravity x4.  Movement examination: Tone: There is mild increased tone in the UE Abnormal movements: there is intermittent mild RUE rest tremor Coordination:  There is min decremation with RAM's Gait and Station: The patient has no difficulty arising out of a deep-seated chair without the use of the hands. The patient's stride length is mild decreased.  He is flexed at knees, wide based and just slightly unsteady.    I have reviewed and interpreted the following labs independently    Chemistry      Component Value Date/Time   NA 143 04/22/2019 0613   K 4.1 04/22/2019 0613   CL 105 04/22/2019 0613   CO2 29 12/21/2018 0921   BUN 26 (H) 04/22/2019 0613   CREATININE 1.10 04/22/2019 0613      Component Value Date/Time   CALCIUM 10.0 12/21/2018 0921   ALKPHOS 56 12/21/2018 0921  AST 15 12/21/2018 0921   ALT 4 12/21/2018 0921   BILITOT 0.6 12/21/2018 0921       Lab Results  Component Value Date   WBC 4.9 12/21/2018   HGB 14.3 04/22/2019   HCT 42.0 04/22/2019   MCV 93.5 12/21/2018   PLT 280.0 12/21/2018    Lab Results  Component Value Date   TSH 1.03 12/21/2018     Total time spent on today's visit was 30 minutes, including both face-to-face time and nonface-to-face time.  Time included that spent on review of records (prior notes available to me/labs/imaging if pertinent),  discussing treatment and goals, answering patient's questions and coordinating care.  Cc:  Eulas Post, MD

## 2019-09-06 ENCOUNTER — Encounter: Payer: Self-pay | Admitting: Neurology

## 2019-09-06 ENCOUNTER — Other Ambulatory Visit: Payer: Self-pay

## 2019-09-06 ENCOUNTER — Ambulatory Visit: Payer: PPO | Admitting: Neurology

## 2019-09-06 VITALS — BP 149/78 | HR 68 | Ht 74.0 in | Wt 185.0 lb

## 2019-09-06 DIAGNOSIS — G20 Parkinson's disease: Secondary | ICD-10-CM | POA: Diagnosis not present

## 2019-09-06 MED ORDER — CARBIDOPA-LEVODOPA 25-100 MG PO TABS: ORAL_TABLET | ORAL | 1 refills | Status: DC

## 2019-09-06 NOTE — Patient Instructions (Signed)
No changes in your medication.  The physicians and staff at [facility] are committed to providing excellent care. You may receive a survey requesting feedback about your experience at our office. We strive to receive "very good" responses to the survey questions. If you feel that your experience would prevent you from giving the office a "very good " response, please contact our office to try to remedy the situation. We may be reached at [phone]. Thank you for taking the time out of your busy day to complete the survey.

## 2019-10-21 DIAGNOSIS — C44329 Squamous cell carcinoma of skin of other parts of face: Secondary | ICD-10-CM | POA: Diagnosis not present

## 2019-10-21 DIAGNOSIS — L570 Actinic keratosis: Secondary | ICD-10-CM | POA: Diagnosis not present

## 2019-10-21 DIAGNOSIS — D0439 Carcinoma in situ of skin of other parts of face: Secondary | ICD-10-CM | POA: Diagnosis not present

## 2019-11-11 ENCOUNTER — Telehealth: Payer: Self-pay | Admitting: Family Medicine

## 2019-11-11 NOTE — Telephone Encounter (Signed)
Patient had his flu shot done at the [facility] on 11/07/2019 and needs it updated in his chart.

## 2019-11-11 NOTE — Telephone Encounter (Signed)
Vaccine information entered as below.

## 2019-12-13 ENCOUNTER — Other Ambulatory Visit: Payer: Self-pay

## 2019-12-13 ENCOUNTER — Ambulatory Visit (INDEPENDENT_AMBULATORY_CARE_PROVIDER_SITE_OTHER): Payer: PPO

## 2019-12-13 ENCOUNTER — Ambulatory Visit: Payer: PPO | Admitting: Family Medicine

## 2019-12-13 ENCOUNTER — Ambulatory Visit: Payer: Self-pay

## 2019-12-13 ENCOUNTER — Encounter: Payer: Self-pay | Admitting: Family Medicine

## 2019-12-13 ENCOUNTER — Telehealth: Payer: Self-pay | Admitting: Family Medicine

## 2019-12-13 VITALS — BP 120/82 | HR 72 | Ht 74.0 in | Wt 185.0 lb

## 2019-12-13 DIAGNOSIS — M25512 Pain in left shoulder: Secondary | ICD-10-CM | POA: Diagnosis not present

## 2019-12-13 DIAGNOSIS — R0781 Pleurodynia: Secondary | ICD-10-CM

## 2019-12-13 DIAGNOSIS — G8929 Other chronic pain: Secondary | ICD-10-CM

## 2019-12-13 DIAGNOSIS — M19012 Primary osteoarthritis, left shoulder: Secondary | ICD-10-CM | POA: Diagnosis not present

## 2019-12-13 DIAGNOSIS — G20 Parkinson's disease: Secondary | ICD-10-CM

## 2019-12-13 DIAGNOSIS — S2231XD Fracture of one rib, right side, subsequent encounter for fracture with routine healing: Secondary | ICD-10-CM | POA: Diagnosis not present

## 2019-12-13 NOTE — Assessment & Plan Note (Signed)
Patient's right upper extremity has a worsening tremor.  We will get x-rays to make sure that there is not an occult fracture noted of the ribs.  Patient seems to be doing relatively well.  Has had his wife so in the hospital and will get a rib series to also look for any type of infectious etiology which I think is unremarkable.  Patient will follow up with me again 6 weeks.

## 2019-12-13 NOTE — Patient Instructions (Addendum)
Injected shoulder today Xray left shoulder and rib series Two weeks off from band exercises then restart 3x a week Don't take everyone's money on the course See me again in 6 weeks

## 2019-12-13 NOTE — Telephone Encounter (Signed)
Noted, sent message in my chart

## 2019-12-13 NOTE — Progress Notes (Signed)
[place] Phone: [phone] Subjective:   Bryce Wood, am serving as a scribe for Dr. Hulan Saas. This visit occurred during the SARS-CoV-2 public health emergency.  Safety protocols were in place, including screening questions prior to the visit, additional usage of staff PPE, and extensive cleaning of exam room while observing appropriate contact time as indicated for disinfecting solutions.   I'm seeing this patient by the request  of:  Eulas Post, MD  CC: Left shoulder pain follow-up, right-sided discomfort  DUK:[id number]   07/10/2017 Doing well at this time.  Wood significant changes in management.  95% better.  Follow-up as needed  Update 12/13/2019 Bryce Wood is a 74 y.o. male coming in with complaint of left shoulder pain. Patient states that he plays a lot of golf. Using 650mg  Tylenol TID. Shoulder arthritis is flaring up recently. Pain over superior aspect.   Also feels like he pulled a muscle on right side one week ago. Has played golf without pain. Pain with flexion and reaching for club in bag.  Patient states that it is annoying.  Does not keep him up at night, Wood radiation of pain.  Seems to be fairly localized.  Denies any shortness of breath, chest pain, radiation down the arm.     Past Medical History:  Diagnosis Date   ARTHRITIS 08/01/2008   Cancer ([place]) 01/2019   basal cell removed from face   HYPERLIPIDEMIA 11/21/2008   HYPERTENSION 08/01/2008   OSTEOARTHRITIS 08/01/2008   Parkinson's disease ([place])    Past Surgical History:  Procedure Laterality Date   CIRCUMCISION N/A 04/22/2019   Procedure: CIRCUMCISION ADULT;  Surgeon: [name], MD;  Location: [facility];  Service: Urology;  Laterality: N/A;   HERNIA REPAIR  1979   inguinal   SPINE SURGERY  1999   disk l4 to l5   Social History   Socioeconomic History   Marital status: Married    Spouse  name: Not on file   Number of children: 1   Years of education: Not on file   Highest education level: Some college, Wood degree  Occupational History   Occupation: retired    Comment: Lawyer  Tobacco Use   Smoking status: Never Smoker   Smokeless tobacco: Never Used  Scientific laboratory technician Use: Never used  Substance and Sexual Activity   Alcohol use: Wood   Drug use: Wood   Sexual activity: Not on file  Other Topics Concern   Not on file  Social History Narrative   RIGHT HANDED   [place]   Social Determinants of Health   Financial Resource Strain:    Difficulty of Paying Living Expenses: Not on file  Food Insecurity:    Worried About Charity fundraiser in the Last Year: Not on file   [employer] of Food in the Last Year: Not on file  Transportation Needs:    Lack of Transportation (Medical): Not on file   Lack of Transportation (Non-Medical): Not on file  Physical Activity:    Days of Exercise per Week: Not on file   Minutes of Exercise per Session: Not on file  Stress:    Feeling of Stress : Not on file  Social Connections:    Frequency of Communication with Friends and Family: Not on file   Frequency of Social Gatherings with Friends and Family: Not on file  Attends Religious Services: Not on file   Active Member of Clubs or Organizations: Not on file   Attends Archivist Meetings: Not on file   Marital Status: Not on file   Wood Known Allergies Family History  Problem Relation Age of Onset   Heart disease Mother    Heart attack Mother    Hypertension Father    Stroke Father    Leukemia Father    Healthy Daughter    Cancer Paternal Uncle 69       prostate cancer   Hypertension Brother      Current Outpatient Medications (Cardiovascular):    lisinopril (ZESTRIL) 20 MG tablet, Take 1 tablet (20 mg total) by mouth daily.   Current Outpatient Medications (Analgesics):    acetaminophen  (TYLENOL) 650 MG CR tablet, Take 650 mg by mouth 3 (three) times daily.   HYDROcodone-acetaminophen (NORCO) 5-325 MG tablet, Take 1 tablet by mouth every 6 (six) hours as needed for moderate pain.   Current Outpatient Medications (Other):    amoxicillin (AMOXIL) 500 MG tablet, Take 500 mg by mouth in the morning, at noon, and at bedtime. Took for tooth extraction   carbidopa-levodopa (SINEMET IR) 25-100 MG tablet, 2 in the AM, 2 in the afternoon, 1 in the evening   cholecalciferol (VITAMIN D) 1000 UNITS tablet, Take 2,000 Units by mouth daily.   fish oil-omega-3 fatty acids 1000 MG capsule, Take 1 g by mouth daily.    TURMERIC PO, Take 1 tablet by mouth in the morning and at bedtime.    Reviewed prior external information including notes and imaging from  primary care provider As well as notes that were available from care everywhere and other healthcare systems.  Past medical history, social, surgical and family history all reviewed in electronic medical record.  Wood pertanent information unless stated regarding to the chief complaint.   Review of Systems:  Wood headache, visual changes, nausea, vomiting, diarrhea, constipation, dizziness, abdominal pain, skin rash, fevers, chills, night sweats, weight loss, swollen lymph nodes,  joint swelling, chest pain, shortness of breath, mood changes. POSITIVE muscle aches, body aches  Objective  Blood pressure 120/82, pulse 72, height 6\' 2"  (1.88 m), weight 185 lb (83.9 kg), SpO2 90 %.   General: Wood apparent distress alert and oriented x3 mood and affect normal, dressed appropriately.  HEENT: Pupils equal, extraocular movements intact  Respiratory: Patient's speak in full sentences and does not appear short of breath  Patient does have a shuffling gait Right upper extremity tremor noted.  5 out of 5 strength of the upper extremity. Mild pain in the intercostals around the T7-T8.  Wood pain over the rib itself.  Patient does have some mild  discomfort with deep breath he states and as well as with extension of his arm.  Wood pain at rest.  Wood CVA tenderness.  Wood masses appreciated.  Wood right upper quadrant pain.  Left shoulder does show some very mild atrophy of the shoulder girdle.  Patient does have near full range of motion but does lack the last 5 degrees of forward flexion and external rotation.  Very mild crepitus noted.  Mild positive crossover  Procedure: Real-time Ultrasound Guided Injection of left glenohumeral joint Device: GE Logiq E  Ultrasound guided injection is preferred based studies that show increased duration, increased effect, greater accuracy, decreased procedural pain, increased response rate with ultrasound guided versus blind injection.  Verbal informed consent obtained.  Time-out conducted.  Noted Wood overlying erythema, induration,  or other signs of local infection.  Skin prepped in a sterile fashion.  Local anesthesia: Topical Ethyl chloride.  With sterile technique and under real time ultrasound guidance:  Joint visualized.  21g 2 inch needle inserted posterior approach. Pictures taken for needle placement. Patient did have injection of 2 cc of 0.5% Marcaine, and 1cc of Kenalog 40 mg/dL. Completed without difficulty  Pain immediately resolved suggesting accurate placement of the medication.  Advised to call if fevers/chills, erythema, induration, drainage, or persistent bleeding.  Impression: Technically successful ultrasound guided injection.   Impression and Recommendations:     The above documentation has been reviewed and is accurate and complete Lyndal Pulley, DO

## 2019-12-13 NOTE — Assessment & Plan Note (Signed)
Known arthritic changes but is doing very well.  Patient does have a history of Parkinson's which does change patient's treatment options.  Had been nearly 3 years since we have had difficulty again.  Hopefully the patient will make progress.  X-rays ordered today to further evaluate the amount and progression of the arthritis.  Follow-up with me again 6 weeks

## 2019-12-13 NOTE — Telephone Encounter (Signed)
[facility] called with a call report on the right rib xray that was done today.  Impression: Healing fracture posterolateral right seventh rib in essentially anatomic alignment. No other rib fracture evident. No pneumothorax or pleural effusion. Right lung clear.

## 2019-12-21 ENCOUNTER — Ambulatory Visit (INDEPENDENT_AMBULATORY_CARE_PROVIDER_SITE_OTHER): Payer: PPO

## 2019-12-21 ENCOUNTER — Other Ambulatory Visit: Payer: Self-pay

## 2019-12-21 DIAGNOSIS — Z0000 Encounter for general adult medical examination without abnormal findings: Secondary | ICD-10-CM

## 2019-12-21 NOTE — Progress Notes (Signed)
Subjective:   Kingdom Vanzanten is a 74 y.o. male who presents for Medicare Annual/Subsequent preventive examination. I connected with Hardie Shackleton today by telephone and verified that I am speaking with the correct person using two identifiers. Location patient: home Location provider: work Persons participating in the virtual visit: patient, provider.   I discussed the limitations, risks, security and privacy concerns of performing an evaluation and management service by telephone and the availability of in person appointments. I also discussed with the patient that there may be a patient responsible charge related to this service. The patient expressed understanding and verbally consented to this telephonic visit.    Interactive audio and video telecommunications were attempted between this provider and patient, however failed, due to patient having technical difficulties OR patient did not have access to video capability.  We continued and completed visit with audio only.      Review of Systems    N/A  Cardiac Risk Factors include: advanced age (>80men, >95 women);male gender;hypertension     Objective:    Today's Vitals   There is no height or weight on file to calculate BMI.  Advanced Directives 12/21/2019 04/22/2019 04/02/2019 07/30/2018 01/03/2017 12/20/2015  Does Patient Have a Medical Advance Directive? Yes - Yes Yes Yes Yes  Type of Paramedic of [place];Living will [place];Living will [place];Living will Living will;Healthcare Power of Attorney - -  Does patient want to make changes to medical advance directive? No - Patient declined - - - - -  Copy of [place] in Chart? No - copy requested Yes - validated most recent copy scanned in chart (See row information) - - - -    Current Medications (verified) Outpatient Encounter Medications as of 12/21/2019  Medication Sig  . acetaminophen  (TYLENOL) 650 MG CR tablet Take 650 mg by mouth 3 (three) times daily.  . carbidopa-levodopa (SINEMET IR) 25-100 MG tablet 2 in the AM, 2 in the afternoon, 1 in the evening  . cholecalciferol (VITAMIN D) 1000 UNITS tablet Take 2,000 Units by mouth daily.  . fish oil-omega-3 fatty acids 1000 MG capsule Take 1 g by mouth daily.   Marland Kitchen lisinopril (ZESTRIL) 20 MG tablet Take 1 tablet (20 mg total) by mouth daily.  . TURMERIC PO Take 1 tablet by mouth in the morning and at bedtime.   Marland Kitchen amoxicillin (AMOXIL) 500 MG tablet Take 500 mg by mouth in the morning, at noon, and at bedtime. Took for tooth extraction (Patient not taking: Reported on 12/21/2019)  . [DISCONTINUED] HYDROcodone-acetaminophen (NORCO) 5-325 MG tablet Take 1 tablet by mouth every 6 (six) hours as needed for moderate pain. (Patient not taking: Reported on 12/21/2019)   No facility-administered encounter medications on file as of 12/21/2019.    Allergies (verified) Patient has no known allergies.   History: Past Medical History:  Diagnosis Date  . ARTHRITIS 08/01/2008  . Cancer ([place]) 01/2019   basal cell removed from face  . HYPERLIPIDEMIA 11/21/2008  . HYPERTENSION 08/01/2008  . OSTEOARTHRITIS 08/01/2008  . Parkinson's disease [facility])    Past Surgical History:  Procedure Laterality Date  . CIRCUMCISION N/A 04/22/2019   Procedure: CIRCUMCISION ADULT;  Surgeon: [name], MD;  [facility]: [facility];  Service: Urology;  Laterality: N/A;  . circumcison    . HERNIA REPAIR  1979   inguinal  . SPINE SURGERY  1999   disk l4 to l5   Family History  Problem  Relation Age of Onset  . Heart disease Mother   . Heart attack Mother   . Hypertension Father   . Stroke Father   . Leukemia Father   . Healthy Daughter   . Cancer Paternal Uncle 88       prostate cancer  . Hypertension Brother    Social History   Socioeconomic History  . Marital status: Married    Spouse name: Not on file  . Number of children: 1  . Years  of education: Not on file  . Highest education level: Some college, no degree  Occupational History  . Occupation: retired    Comment: Lawyer  Tobacco Use  . Smoking status: Never Smoker  . Smokeless tobacco: Never Used  Vaping Use  . Vaping Use: Never used  Substance and Sexual Activity  . Alcohol use: No  . Drug use: No  . Sexual activity: Not on file  Other Topics Concern  . Not on file  Social History Narrative   RIGHT HANDED   ONE STORY    LIVES WITH SPOUSE   Social Determinants of Health   Financial Resource Strain: Low Risk   . Difficulty of Paying Living Expenses: Not hard at all  Food Insecurity: No Food Insecurity  . Worried About Charity fundraiser in the Last Year: Never true  . Ran Out of Food in the Last Year: Never true  Transportation Needs: No Transportation Needs  . Lack of Transportation (Medical): No  . Lack of Transportation (Non-Medical): No  Physical Activity: Sufficiently Active  . Days of Exercise per Week: 5 days  . Minutes of Exercise per Session: 40 min  Stress: No Stress Concern Present  . Feeling of Stress : Not at all  Social Connections: Socially Integrated  . Frequency of Communication with Friends and Family: More than three times a week  . Frequency of Social Gatherings with Friends and Family: More than three times a week  . Attends Religious Services: More than 4 times per year  . Active Member of Clubs or Organizations: Yes  . Attends Archivist Meetings: More than 4 times per year  . Marital Status: Married    Tobacco Counseling Counseling given: Not Answered   Clinical Intake:  Pre-visit preparation completed: Yes  Pain : No/denies pain     Nutritional Risks: None Diabetes: No  How often do you need to have someone help you when you read instructions, pamphlets, or other written materials from your doctor or pharmacy?: 1 - Never What is the last grade level you completed in school?: 1 year  College  Diabetic?No      Information entered by :: [place] of Daily Living In your present state of health, do you have any difficulty performing the following activities: 12/21/2019 04/22/2019  Hearing? N N  Vision? N N  Difficulty concentrating or making decisions? N N  Walking or climbing stairs? N N  Dressing or bathing? N N  Doing errands, shopping? N -  Preparing Food and eating ? N -  Using the Toilet? N -  In the past six months, have you accidently leaked urine? N -  Do you have problems with loss of bowel control? N -  Managing your Medications? N -  Managing your Finances? N -  Housekeeping or managing your Housekeeping? N -  Some recent data might be hidden    Patient Care Team: Eulas Post, MD as PCP - General  Indicate any recent Medical Services you may have received from other than [facility] providers in the past year (date may be approximate).     Assessment:   This is a routine wellness examination for [place].  Hearing/Vision screen  Hearing Screening   125Hz  250Hz  500Hz  1000Hz  2000Hz  3000Hz  4000Hz  6000Hz  8000Hz   Right ear:           Left ear:           Vision Screening Comments: Gets eyes Examined yearly.No visuals  Dietary issues and exercise activities discussed: Current Exercise Habits: Home exercise routine, Type of exercise: stretching, Time (Minutes): 35, Frequency (Times/Week): 5, Weekly Exercise (Minutes/Week): 175, Intensity: Mild  Goals    . patient     Stay in shape, fup pool work or other of your choice     . Patient Stated     To stay active and engaged     . Patient Stated     I would like to continue to play golf       Depression Screen PHQ 2/9 Scores 12/21/2019 12/21/2018 01/03/2017 09/04/2016 12/20/2015 06/24/2014 06/24/2014  PHQ - 2 Score 0 0 0 0 0 0 0  PHQ- 9 Score 0 0 - - - - -    Fall Risk Fall Risk  12/21/2019 09/06/2019 04/02/2019 07/30/2018 11/03/2017  Falls in the past year? 1 0 1 0 No  Number falls in  past yr: 1 0 0 0 -  Injury with Fall? 0 - 0 0 -  Risk for fall due to : Impaired balance/gait - - - -  Follow up Falls evaluation completed;Falls prevention discussed - - - -    FALL RISK PREVENTION PERTAINING TO THE HOME:  Any stairs in or around the home? No  If so, are there any without handrails? No  Home free of loose throw rugs in walkways, pet beds, electrical cords, etc? Yes  Adequate lighting in your home to reduce risk of falls? Yes   ASSISTIVE DEVICES UTILIZED TO PREVENT FALLS:  Life alert? No  Use of a cane, walker or w/c? No  Grab bars in the bathroom? No  Shower chair or bench in shower? No  Elevated toilet seat or a handicapped toilet? No     Cognitive Function: Cognitive screening not indicated based on direct observation  MMSE - Mini Mental State Exam 01/03/2017 12/20/2015  Not completed: (No Data) (No Data)        Immunizations Immunization History  Administered Date(s) Administered  . Fluad Quad(high Dose 65+) 11/07/2019  . Influenza, High Dose Seasonal PF 10/18/2015, 09/28/2016, 11/19/2017, 09/14/2018  . Influenza-Unspecified 11/14/2016, 11/07/2019  . Moderna SARS-COVID-2 Vaccination 02/12/2019, 03/12/2019, 11/08/2019  . Pneumococcal Conjugate-13 11/19/2017  . Pneumococcal Polysaccharide-23 02/11/2012  . Td 09/18/2001, 01/14/2006  . Tdap 02/22/2017    TDAP status: Up to date  Flu Vaccine status: Up to date  Pneumococcal vaccine status: Up to date  Covid-19 vaccine status: Completed vaccines  Qualifies for Shingles Vaccine? Yes   Zostavax completed No   Shingrix Completed?: No.    Education has been provided regarding the importance of this vaccine. Patient has been advised to call insurance company to determine out of pocket expense if they have not yet received this vaccine. Advised may also receive vaccine at local pharmacy or Health Dept. Verbalized acceptance and understanding.  Screening Tests Health Maintenance  Topic Date Due  .  Fecal DNA (Cologuard)  09/06/2018  . TETANUS/TDAP  02/23/2027  . INFLUENZA VACCINE  Completed  .  COVID-19 Vaccine  Completed  . Hepatitis C Screening  Completed  . PNA vac Low Risk Adult  Completed    Health Maintenance  Health Maintenance Due  Topic Date Due  . Fecal DNA (Cologuard)  09/06/2018    Colorectal cancer screening: Type of screening: Cologuard. Completed 09/06/2015. Repeat every 3 years  Lung Cancer Screening: (Low Dose CT Chest recommended if Age 62-80 years, 30 pack-year currently smoking OR have quit w/in 15years.) does not qualify.   Lung Cancer Screening Referral: N/A   Additional Screening:  Hepatitis C Screening: does qualify; Completed 09/04/2016  Vision Screening: Recommended annual ophthalmology exams for early detection of glaucoma and other disorders of the eye. Is the patient up to date with their annual eye exam?  Yes  Who is the provider or what is the name of the office in which the patient attends annual eye exams? Dr. Marica Otter If pt is not established with a provider, would they like to be referred to a provider to establish care? No .   Dental Screening: Recommended annual dental exams for proper oral hygiene  Community Resource Referral / Chronic Care Management: CRR required this visit?  No   CCM required this visit?  No      Plan:     I have personally reviewed and noted the following in the patient's chart:   . Medical and social history . Use of alcohol, tobacco or illicit drugs  . Current medications and supplements . Functional ability and status . Nutritional status . Physical activity . Advanced directives . List of other physicians . Hospitalizations, surgeries, and ER visits in previous 12 months . Vitals . Screenings to include cognitive, depression, and falls . Referrals and appointments  In addition, I have reviewed and discussed with patient certain preventive protocols, quality metrics, and best practice  recommendations. A written personalized care plan for preventive services as well as general preventive health recommendations were provided to patient.     Ofilia Neas, LPN   45/08/996   Nurse Notes:  Patient is currently due for Cologuard screening

## 2019-12-21 NOTE — Patient Instructions (Signed)
Bryce Wood , Thank you for taking time to come for your Medicare Wellness Visit. I appreciate your ongoing commitment to your health goals. Please review the following plan we discussed and let me know if I can assist you in the future.   Screening recommendations/referrals: Colonoscopy: You are currently due for your cologuard testing, we will order for you at your next office visit  Recommended yearly ophthalmology/optometry visit for glaucoma screening and checkup Recommended yearly dental visit for hygiene and checkup  Vaccinations: Influenza vaccine: Up to date, next due fall 2022 Pneumococcal vaccine: Completed series  Tdap vaccine: Up to date, next due 02/23/2027 Shingles vaccine: Currently due for Shingrix, if you wish to receive we recommend that you do so at your local pharmacy as it is less expensive     Advanced directives: Please bring in copies of your advanced medical directives so that we may scan them into your chart.  Conditions/risks identified: None   Next appointment: 12/24/2019 @ 7:00 am with Dr. Elease Hashimoto   Preventive Care 65 Years and Older, Male Preventive care refers to lifestyle choices and visits with your health care provider that can promote health and wellness. What does preventive care include?  A yearly physical exam. This is also called an annual well check.  Dental exams once or twice a year.  Routine eye exams. Ask your health care provider how often you should have your eyes checked.  Personal lifestyle choices, including:  Daily care of your teeth and gums.  Regular physical activity.  Eating a healthy diet.  Avoiding tobacco and drug use.  Limiting alcohol use.  Practicing safe sex.  Taking low doses of aspirin every day.  Taking vitamin and mineral supplements as recommended by your health care provider. What happens during an annual well check? The services and screenings done by your health care provider during your annual  well check will depend on your age, overall health, lifestyle risk factors, and family history of disease. Counseling  Your health care provider may ask you questions about your:  Alcohol use.  Tobacco use.  Drug use.  Emotional well-being.  Home and relationship well-being.  Sexual activity.  Eating habits.  History of falls.  Memory and ability to understand (cognition).  Work and work Statistician. Screening  You may have the following tests or measurements:  Height, weight, and BMI.  Blood pressure.  Lipid and cholesterol levels. These may be checked every 5 years, or more frequently if you are over 26 years old.  Skin check.  Lung cancer screening. You may have this screening every year starting at age 68 if you have a 30-pack-year history of smoking and currently smoke or have quit within the past 15 years.  Fecal occult blood test (FOBT) of the stool. You may have this test every year starting at age 21.  Flexible sigmoidoscopy or colonoscopy. You may have a sigmoidoscopy every 5 years or a colonoscopy every 10 years starting at age 97.  Prostate cancer screening. Recommendations will vary depending on your family history and other risks.  Hepatitis C blood test.  Hepatitis B blood test.  Sexually transmitted disease (STD) testing.  Diabetes screening. This is done by checking your blood sugar (glucose) after you have not eaten for a while (fasting). You may have this done every 1-3 years.  Abdominal aortic aneurysm (AAA) screening. You may need this if you are a current or former smoker.  Osteoporosis. You may be screened starting at age 71 if you  are at high risk. Talk with your health care provider about your test results, treatment options, and if necessary, the need for more tests. Vaccines  Your health care provider may recommend certain vaccines, such as:  Influenza vaccine. This is recommended every year.  Tetanus, diphtheria, and acellular  pertussis (Tdap, Td) vaccine. You may need a Td booster every 10 years.  Zoster vaccine. You may need this after age 56.  Pneumococcal 13-valent conjugate (PCV13) vaccine. One dose is recommended after age 74.  Pneumococcal polysaccharide (PPSV23) vaccine. One dose is recommended after age 28. Talk to your health care provider about which screenings and vaccines you need and how often you need them. This information is not intended to replace advice given to you by your health care provider. Make sure you discuss any questions you have with your health care provider. Document Released: 01/27/2015 Document Revised: 09/20/2015 Document Reviewed: 11/01/2014 Elsevier Interactive Patient Education  2017 [employer] Prevention in the Home Falls can cause injuries. They can happen to people of all ages. There are many things you can do to make your home safe and to help prevent falls. What can I do on the outside of my home?  Regularly fix the edges of walkways and driveways and fix any cracks.  Remove anything that might make you trip as you walk through a door, such as a raised step or threshold.  Trim any bushes or trees on the path to your home.  Use bright outdoor lighting.  Clear any walking paths of anything that might make someone trip, such as rocks or tools.  Regularly check to see if handrails are loose or broken. Make sure that both sides of any steps have handrails.  Any raised decks and porches should have guardrails on the edges.  Have any leaves, snow, or ice cleared regularly.  Use sand or salt on walking paths during winter.  Clean up any spills in your garage right away. This includes oil or grease spills. What can I do in the bathroom?  Use night lights.  Install grab bars by the toilet and in the tub and shower. Do not use towel bars as grab bars.  Use non-skid mats or decals in the tub or shower.  If you need to sit down in the shower, use a plastic,  non-slip stool.  Keep the floor dry. Clean up any water that spills on the floor as soon as it happens.  Remove soap buildup in the tub or shower regularly.  Attach bath mats securely with double-sided non-slip rug tape.  Do not have throw rugs and other things on the floor that can make you trip. What can I do in the bedroom?  Use night lights.  Make sure that you have a light by your bed that is easy to reach.  Do not use any sheets or blankets that are too big for your bed. They should not hang down onto the floor.  Have a firm chair that has side arms. You can use this for support while you get dressed.  Do not have throw rugs and other things on the floor that can make you trip. What can I do in the kitchen?  Clean up any spills right away.  Avoid walking on wet floors.  Keep items that you use a lot in easy-to-reach places.  If you need to reach something above you, use a strong step stool that has a grab bar.  Keep electrical cords out  of the way.  Do not use floor polish or wax that makes floors slippery. If you must use wax, use non-skid floor wax.  Do not have throw rugs and other things on the floor that can make you trip. What can I do with my stairs?  Do not leave any items on the stairs.  Make sure that there are handrails on both sides of the stairs and use them. Fix handrails that are broken or loose. Make sure that handrails are as long as the stairways.  Check any carpeting to make sure that it is firmly attached to the stairs. Fix any carpet that is loose or worn.  Avoid having throw rugs at the top or bottom of the stairs. If you do have throw rugs, attach them to the floor with carpet tape.  Make sure that you have a light switch at the top of the stairs and the bottom of the stairs. If you do not have them, ask someone to add them for you. What else can I do to help prevent falls?  Wear shoes that:  Do not have high heels.  Have rubber  bottoms.  Are comfortable and fit you well.  Are closed at the toe. Do not wear sandals.  If you use a stepladder:  Make sure that it is fully opened. Do not climb a closed stepladder.  Make sure that both sides of the stepladder are locked into place.  Ask someone to hold it for you, if possible.  Clearly mark and make sure that you can see:  Any grab bars or handrails.  First and last steps.  Where the edge of each step is.  Use tools that help you move around (mobility aids) if they are needed. These include:  Canes.  Walkers.  Scooters.  Crutches.  Turn on the lights when you go into a dark area. Replace any light bulbs as soon as they burn out.  Set up your furniture so you have a clear path. Avoid moving your furniture around.  If any of your floors are uneven, fix them.  If there are any pets around you, be aware of where they are.  Review your medicines with your doctor. Some medicines can make you feel dizzy. This can increase your chance of falling. Ask your doctor what other things that you can do to help prevent falls. This information is not intended to replace advice given to you by your health care provider. Make sure you discuss any questions you have with your health care provider. Document Released: 10/27/2008 Document Revised: 06/08/2015 Document Reviewed: 02/04/2014 Elsevier Interactive Patient Education  2017 [employer].

## 2019-12-24 ENCOUNTER — Encounter: Payer: PPO | Admitting: Family Medicine

## 2019-12-24 ENCOUNTER — Other Ambulatory Visit: Payer: Self-pay

## 2019-12-24 ENCOUNTER — Ambulatory Visit (INDEPENDENT_AMBULATORY_CARE_PROVIDER_SITE_OTHER): Payer: PPO | Admitting: Family Medicine

## 2019-12-24 ENCOUNTER — Encounter: Payer: Self-pay | Admitting: Family Medicine

## 2019-12-24 VITALS — BP 120/70 | HR 72 | Temp 97.7°F | Ht 72.0 in | Wt 181.7 lb

## 2019-12-24 DIAGNOSIS — Z0000 Encounter for general adult medical examination without abnormal findings: Secondary | ICD-10-CM | POA: Diagnosis not present

## 2019-12-24 LAB — CBC WITH DIFFERENTIAL/PLATELET
Basophils Absolute: 0 10*3/uL (ref 0.0–0.1)
Basophils Relative: 0.7 % (ref 0.0–3.0)
Eosinophils Absolute: 0.1 10*3/uL (ref 0.0–0.7)
Eosinophils Relative: 1.2 % (ref 0.0–5.0)
HCT: 43.9 % (ref 39.0–52.0)
Hemoglobin: 14.4 g/dL (ref 13.0–17.0)
Lymphocytes Relative: 27.5 % (ref 12.0–46.0)
Lymphs Abs: 1.7 10*3/uL (ref 0.7–4.0)
MCHC: 32.8 g/dL (ref 30.0–36.0)
MCV: 94.7 fl (ref 78.0–100.0)
Monocytes Absolute: 0.7 10*3/uL (ref 0.1–1.0)
Monocytes Relative: 11 % (ref 3.0–12.0)
Neutro Abs: 3.7 10*3/uL (ref 1.4–7.7)
Neutrophils Relative %: 59.6 % (ref 43.0–77.0)
Platelets: 299 10*3/uL (ref 150.0–400.0)
RBC: 4.64 Mil/uL (ref 4.22–5.81)
RDW: 13.1 % (ref 11.5–15.5)
WBC: 6.2 10*3/uL (ref 4.0–10.5)

## 2019-12-24 LAB — BASIC METABOLIC PANEL
BUN: 31 mg/dL — ABNORMAL HIGH (ref 6–23)
CO2: 29 mEq/L (ref 19–32)
Calcium: 10 mg/dL (ref 8.4–10.5)
Chloride: 102 mEq/L (ref 96–112)
Creatinine, Ser: 1.26 mg/dL (ref 0.40–1.50)
GFR: 56.75 mL/min — ABNORMAL LOW (ref >60.00)
Glucose, Bld: 80 mg/dL (ref 70–99)
Potassium: 5.2 mEq/L — ABNORMAL HIGH (ref 3.5–5.1)
Sodium: 139 mEq/L (ref 135–145)

## 2019-12-24 LAB — HEPATIC FUNCTION PANEL
ALT: 3 U/L (ref 0–53)
AST: 15 U/L (ref 0–37)
Albumin: 4.6 g/dL (ref 3.5–5.2)
Alkaline Phosphatase: 60 U/L (ref 39–117)
Bilirubin, Direct: 0.1 mg/dL (ref 0.0–0.3)
Total Bilirubin: 0.7 mg/dL (ref 0.2–1.2)
Total Protein: 7.3 g/dL (ref 6.0–8.3)

## 2019-12-24 LAB — LIPID PANEL
Cholesterol: 181 mg/dL (ref 0–200)
HDL: 60.6 mg/dL (ref >39.00)
LDL Cholesterol: 108 mg/dL — ABNORMAL HIGH (ref 0–99)
NonHDL: 120.59
Total CHOL/HDL Ratio: 3
Triglycerides: 65 mg/dL (ref 0.0–149.0)
VLDL: 13 mg/dL (ref 0.0–40.0)

## 2019-12-24 LAB — TSH: TSH: 1.68 u[IU]/mL (ref 0.35–4.50)

## 2019-12-24 MED ORDER — LISINOPRIL 20 MG PO TABS: 20.0000 mg | ORAL_TABLET | Freq: Every day | ORAL | 3 refills | Status: DC

## 2019-12-24 NOTE — Patient Instructions (Signed)

## 2019-12-24 NOTE — Progress Notes (Signed)
Established Patient Office Visit  Subjective:  Patient ID: Bryce Wood, male    DOB: [birth year]  Age: 74 y.o. MRN: [id number]  CC:  Chief Complaint  Patient presents with  . Annual Exam    HPI [name] presents for physical exam.  He has hypertension treated with lisinopril.  Requesting refills.  He has Parkinson's disease.  He remains on Sinemet.  He continues to play golf about 4 times per week.  No other formal exercise.  He has some degenerative arthritis involving several joints.  History of mild hyperlipidemia.  He is followed by dermatology for yearly skin checks.  He continues to see neurology for his Parkinson's disease.  Health maintenance reviewed  -Flu vaccine already given -Pneumonia vaccines completed -Covid vaccines already received -Prior hepatitis C screening negative -Tetanus up-to-date -He had Cologuard 3 years ago and declines repeat at this time.  Family history reviewed-his mother died at 73 in her sleep.  This was presumably related to MI but no confirmation.  His father died of stroke complications.  He also had history of leukemia and hypertension.  He has a brother with hypertension.  Social history-never smoked.  Married with one daughter. No regular alcohol.  Retired.  Golfs frequently.  Past Medical History:  Diagnosis Date  . ARTHRITIS 08/01/2008  . Cancer ([place]) 01/2019   basal cell removed from face  . HYPERLIPIDEMIA 11/21/2008  . HYPERTENSION 08/01/2008  . OSTEOARTHRITIS 08/01/2008  . Parkinson's disease [place])     Past Surgical History:  Procedure Laterality Date  . CIRCUMCISION N/A 04/22/2019   Procedure: CIRCUMCISION ADULT;  Surgeon: [name], MD;  Location: [facility];  Service: Urology;  Laterality: N/A;  . circumcison    . HERNIA REPAIR  1979   inguinal  . SPINE SURGERY  1999   disk l4 to l5    Family History  Problem Relation Age of Onset  . Heart disease Mother   . Heart attack Mother   . Hypertension  Father   . Stroke Father   . Leukemia Father   . Healthy Daughter   . Cancer Paternal Uncle 26       prostate cancer  . Hypertension Brother     Social History   Socioeconomic History  . Marital status: Married    Spouse name: Not on file  . Number of children: 1  . Years of education: Not on file  . Highest education level: Some college, no degree  Occupational History  . Occupation: retired    Comment: Lawyer  Tobacco Use  . Smoking status: Never Smoker  . Smokeless tobacco: Never Used  Vaping Use  . Vaping Use: Never used  Substance and Sexual Activity  . Alcohol use: No  . Drug use: No  . Sexual activity: Not on file  Other Topics Concern  . Not on file  Social History Narrative   RIGHT HANDED   ONE STORY    LIVES WITH SPOUSE   Social Determinants of Health   Financial Resource Strain: Low Risk   . Difficulty of Paying Living Expenses: Not hard at all  Food Insecurity: No Food Insecurity  . Worried About Charity fundraiser in the Last Year: Never true  . Ran Out of Food in the Last Year: Never true  Transportation Needs: No Transportation Needs  . Lack of Transportation (Medical): No  . Lack of Transportation (Non-Medical): No  Physical Activity: Sufficiently Active  . Days of Exercise per Week:  5 days  . Minutes of Exercise per Session: 40 min  Stress: No Stress Concern Present  . Feeling of Stress : Not at all  Social Connections: Socially Integrated  . Frequency of Communication with Friends and Family: More than three times a week  . Frequency of Social Gatherings with Friends and Family: More than three times a week  . Attends Religious Services: More than 4 times per year  . Active Member of Clubs or Organizations: Yes  . Attends Archivist Meetings: More than 4 times per year  . Marital Status: Married  Human resources officer Violence: Not At Risk  . Fear of Current or Ex-Partner: No  . Emotionally Abused: No  . Physically  Abused: No  . Sexually Abused: No    Outpatient Medications Prior to Visit  Medication Sig Dispense Refill  . acetaminophen (TYLENOL) 650 MG CR tablet Take 650 mg by mouth 3 (three) times daily.    . carbidopa-levodopa (SINEMET IR) 25-100 MG tablet 2 in the AM, 2 in the afternoon, 1 in the evening 450 tablet 1  . cholecalciferol (VITAMIN D) 1000 UNITS tablet Take 2,000 Units by mouth daily.    . fish oil-omega-3 fatty acids 1000 MG capsule Take 1 g by mouth daily.     . TURMERIC PO Take 1 tablet by mouth in the morning and at bedtime.     Marland Kitchen lisinopril (ZESTRIL) 20 MG tablet Take 1 tablet (20 mg total) by mouth daily. 90 tablet 3  . amoxicillin (AMOXIL) 500 MG tablet Take 500 mg by mouth in the morning, at noon, and at bedtime. Took for tooth extraction (Patient not taking: No sig reported)     No facility-administered medications prior to visit.    No Known Allergies  ROS Review of Systems  Constitutional: Negative for chills, fatigue, fever and unexpected weight change.  Eyes: Negative for visual disturbance.  Respiratory: Negative for cough, chest tightness and shortness of breath.   Cardiovascular: Negative for chest pain, palpitations and leg swelling.  Gastrointestinal: Negative for abdominal pain.  Endocrine: Negative for polydipsia and polyuria.  Genitourinary: Negative for dysuria.  Neurological: Positive for tremors. Negative for dizziness, syncope, weakness, light-headedness and headaches.      Objective:    Physical Exam Constitutional:      General: He is not in acute distress.    Appearance: He is well-developed and well-nourished.  HENT:     Head: Normocephalic and atraumatic.     Right Ear: External ear normal.     Left Ear: External ear normal.     Mouth/Throat:     Mouth: Oropharynx is clear and moist.  Eyes:     Extraocular Movements: EOM normal.     Conjunctiva/sclera: Conjunctivae normal.     Pupils: Pupils are equal, round, and reactive to light.   Neck:     Thyroid: No thyromegaly.  Cardiovascular:     Rate and Rhythm: Normal rate and regular rhythm.     Heart sounds: Normal heart sounds. No murmur heard.   Pulmonary:     Effort: No respiratory distress.     Breath sounds: No wheezing or rales.  Abdominal:     General: Bowel sounds are normal. There is no distension.     Palpations: Abdomen is soft. There is no mass.     Tenderness: There is no abdominal tenderness. There is no guarding or rebound.  Musculoskeletal:        General: No edema.  Cervical back: Normal range of motion and neck supple.  Lymphadenopathy:     Cervical: No cervical adenopathy.  Skin:    Findings: No rash.  Neurological:     Mental Status: He is alert and oriented to person, place, and time.     Cranial Nerves: No cranial nerve deficit.     Comments: He has tremor consistent with his Parkinson's disease.  Psychiatric:        Mood and Affect: Mood and affect normal.     BP 120/70 (BP Location: Left Arm, Patient Position: Sitting, Cuff Size: Normal)   Pulse 72   Temp 97.7 F (36.5 C) (Oral)   Ht 6' (1.829 m)   Wt 181 lb 11.2 oz (82.4 kg)   SpO2 94%   BMI 24.64 kg/m  Wt Readings from Last 3 Encounters:  12/24/19 181 lb 11.2 oz (82.4 kg)  12/13/19 185 lb (83.9 kg)  09/06/19 185 lb (83.9 kg)     There are no preventive care reminders to display for this patient.  There are no preventive care reminders to display for this patient.  Lab Results  Component Value Date   TSH 1.03 12/21/2018   Lab Results  Component Value Date   WBC 4.9 12/21/2018   HGB 14.3 04/22/2019   HCT 42.0 04/22/2019   MCV 93.5 12/21/2018   PLT 280.0 12/21/2018   Lab Results  Component Value Date   NA 143 04/22/2019   K 4.1 04/22/2019   CO2 29 12/21/2018   GLUCOSE 97 04/22/2019   BUN 26 (H) 04/22/2019   CREATININE 1.10 04/22/2019   BILITOT 0.6 12/21/2018   ALKPHOS 56 12/21/2018   AST 15 12/21/2018   ALT 4 12/21/2018   PROT 6.8 12/21/2018    ALBUMIN 4.5 12/21/2018   CALCIUM 10.0 12/21/2018   GFR 71.78 12/21/2018   Lab Results  Component Value Date   CHOL 195 12/21/2018   Lab Results  Component Value Date   HDL 53.10 12/21/2018   Lab Results  Component Value Date   LDLCALC 118 (H) 12/21/2018   Lab Results  Component Value Date   TRIG 118.0 12/21/2018   Lab Results  Component Value Date   CHOLHDL 4 12/21/2018   No results found for: HGBA1C    Assessment & Plan:   Problem List Items Addressed This Visit   None   Visit Diagnoses    Physical exam    -  Primary   Relevant Orders   Basic metabolic panel   Lipid panel   CBC with Differential/Platelet   TSH   Hepatic function panel    -Vaccines up-to-date -We discussed repeat Cologuard versus repeat colonoscopy and he declines either at this time -Refill lisinopril for 1 year. -His blood pressures well controlled at this time.  We discussed issues of potential for autonomic instability over time.  Be in touch if he is developing any lightheadedness or orthostatic changes -Strongly advocated continuing regular exercise program -Obtain follow-up labs.  He declined PSA  Meds ordered this encounter  Medications  . lisinopril (ZESTRIL) 20 MG tablet    Sig: Take 1 tablet (20 mg total) by mouth daily.    Dispense:  90 tablet    Refill:  3    Follow-up: No follow-ups on file.    Carolann Littler, MD

## 2019-12-26 ENCOUNTER — Encounter: Payer: Self-pay | Admitting: Family Medicine

## 2019-12-29 NOTE — Progress Notes (Signed)
Potassium came back slightly high.  Make sure not taking any potassium supplement. Other labs all stable.

## 2020-01-26 ENCOUNTER — Ambulatory Visit: Payer: PPO | Admitting: Family Medicine

## 2020-03-07 NOTE — Progress Notes (Signed)
Assessment/Plan:   1.  Parkinsons Disease  -Continue carbidopa/levodopa 25/100, 2/2/1  -Patient follows regularly with dermatology.  2.  REM behavior disorder  -This is commonly associated with PD and the patient is experiencing this.  We discussed that this can be very serious and even harmful.  We talked about medications as well as physical barriers to put in the bed (particularly soft bed rails, pillow barriers).  We talked about moving the night stand so that it is not so close to the side of the bed.  He doesn't want medication right now. Subjective:   Bryce Wood was seen today in follow up for Parkinsons disease.  My previous records were reviewed prior to todays visit as well as outside records available to me. Pt with a few falls - states that he tripped a few times - didn't get hurt.  Pt denies lightheadedness, near syncope.  No hallucinations.  Mood has been good.  Few bad dreams that he has acted out.  No one in the bed with him.  Actually woke himself up and hit himself in the head.  No falling out of the bed.  Saw primary care December 10.  Notes are reviewed.  Current prescribed movement disorder medications: Carbidopa/levodopa 25/100, 2/2/1    ALLERGIES:  No Known Allergies  CURRENT MEDICATIONS:  Outpatient Encounter Medications as of 03/08/2020  Medication Sig  . acetaminophen (TYLENOL) 650 MG CR tablet Take 650 mg by mouth 3 (three) times daily.  . carbidopa-levodopa (SINEMET IR) 25-100 MG tablet 2 in the AM, 2 in the afternoon, 1 in the evening  . cholecalciferol (VITAMIN D) 1000 UNITS tablet Take 2,000 Units by mouth daily.  . fish oil-omega-3 fatty acids 1000 MG capsule Take 1 g by mouth daily.   Marland Kitchen lisinopril (ZESTRIL) 20 MG tablet Take 1 tablet (20 mg total) by mouth daily.  . TURMERIC PO Take 1 tablet by mouth in the morning and at bedtime.   . [DISCONTINUED] amoxicillin (AMOXIL) 500 MG tablet Take 500 mg by mouth in the morning, at noon, and at bedtime.  Took for tooth extraction (Patient not taking: No sig reported)   No facility-administered encounter medications on file as of 03/08/2020.    Objective:   PHYSICAL EXAMINATION:    VITALS:   Vitals:   03/08/20 0803  BP: (!) 150/72  Pulse: 64  SpO2: 99%  Weight: 189 lb (85.7 kg)  Height: 6\' 1"  (1.854 m)    GEN:  The patient appears stated age and is in NAD. HEENT:  Normocephalic, atraumatic.  The mucous membranes are moist. The superficial temporal arteries are without ropiness or tenderness. CV:  RRR Lungs:  CTAB Neck/HEME:  There are no carotid bruits bilaterally.  Neurological examination:  Orientation: The patient is alert and oriented x3. Cranial nerves: There is good facial symmetry with mild facial hypomimia. The speech is fluent and clear. Soft palate rises symmetrically and there is no tongue deviation. Hearing is intact to conversational tone. Sensation: Sensation is intact to light touch throughout Motor: Strength is at least antigravity x4.  Movement examination: Tone: There is mild increased tone in the right upper extremity Abnormal movements: There is right upper extremity rest tremor Coordination:  There is mild decremation with RAM's, mostly with finger taps on the right.  Other rapid alternating movements are good. Gait and Station: The patient has no difficulty arising out of a deep-seated chair without the use of the hands. The patient's stride length is decreased and  slightly antalgic as well.    I have reviewed and interpreted the following labs independently    Chemistry      Component Value Date/Time   NA 139 12/24/2019 0735   K 5.2 (H) 12/24/2019 0735   CL 102 12/24/2019 0735   CO2 29 12/24/2019 0735   BUN 31 (H) 12/24/2019 0735   CREATININE 1.26 12/24/2019 0735      Component Value Date/Time   CALCIUM 10.0 12/24/2019 0735   ALKPHOS 60 12/24/2019 0735   AST 15 12/24/2019 0735   ALT 3 12/24/2019 0735   BILITOT 0.7 12/24/2019 0735        Lab Results  Component Value Date   WBC 6.2 12/24/2019   HGB 14.4 12/24/2019   HCT 43.9 12/24/2019   MCV 94.7 12/24/2019   PLT 299.0 12/24/2019    Lab Results  Component Value Date   TSH 1.68 12/24/2019     Total time spent on today's visit was 20 minutes, including both face-to-face time and nonface-to-face time.  Time included that spent on review of records (prior notes available to me/labs/imaging if pertinent), discussing treatment and goals, answering patient's questions and coordinating care.  Cc:  Eulas Post, MD

## 2020-03-08 ENCOUNTER — Other Ambulatory Visit: Payer: Self-pay

## 2020-03-08 ENCOUNTER — Ambulatory Visit: Payer: PPO | Admitting: Neurology

## 2020-03-08 ENCOUNTER — Encounter: Payer: Self-pay | Admitting: Neurology

## 2020-03-08 VITALS — BP 150/72 | HR 64 | Ht 73.0 in | Wt 189.0 lb

## 2020-03-08 DIAGNOSIS — G20 Parkinson's disease: Secondary | ICD-10-CM

## 2020-03-08 MED ORDER — CARBIDOPA-LEVODOPA 25-100 MG PO TABS: ORAL_TABLET | ORAL | 1 refills | Status: DC

## 2020-03-08 NOTE — Patient Instructions (Signed)
Online Resources for Power over Parkinson's Group February 2022  . Local [place] Online Groups  o Power over [employer] Group :   - Power Over Parkinson's Patient Education Group will be Wednesday, February 9th at 2pm via Zoom.   - Upcoming Power over Parkinson's Meetings:  2nd Wednesdays of the month at 2 pm:       March 9th, April 13th - Contact Amy Marriott at [email or web address] if interested in participating in this online group o Parkinson's Care Partners Group:    3rd Mondays, Contact Corwin Levins o Atypical Parkinsonian Patient Group:   4th Wednesdays, Contact Corwin Levins o If you are interested in participating in these online groups with Judson Roch, please contact her directly for how to join those meetings.  Her contact information is [email or web address].  She will send you a link to join the [employer].  (Please note that Corwin Levins , MSW, LCSW, has resigned her position at [facility], but will continue to lead the online groups temporarily)  . [place]:  [email or web address] o PD Health at Home continues:  Mindfulness Mondays, Expert Briefing Tuesdays, Wellness Wednesdays, Take Time Thursdays, Fitness Fridays -Listings for February 2022 are on the website o Upcoming Webinar:  Sights, Sounds, and Parkinson's.  Wednesday, February 2 @ 1 pm o Upcoming Webinar:  Conversations about Complementary Therapies and PD.  Wednesday, March 2 @ 1 pm o Geneticist, molecular) at [email or web address] o  Please check out their website to sign up for emails and see their full online offerings  . [place]:  [email or web address]  o Upcoming Webinar:   Moving with Mood Changes in Aging and Parkinson's:  A Look at Depression and Anxiety.  (This is a replay of a webinar originally aired June 2020)  Thursday, February 17 @ 12 noon o Check out additional information on their website to see their full online  offerings  . [place]:  [email or web address] o Upcoming Webinar:  The [place].  The Parkinson's You Don't See:  When your Autonomic System goes Off-Track.   Friday, February 18th.  Go to [email or web address], then click on Events, 12-18-1972 tab to register. o Care Partner Monthly Meetup.  With [employer] [place].  First Tuesday of each month, 2 pm o Check out additional information to Live Well Today on their website  . Parkinson and Movement Disorders (PMD) Alliance:  www.pmdalliance.org o [employer] Online:  Online Education Events o Sign up for emails, which are sent weekly to give you updates on programming and online offerings . Parkinson's Association of the [place]:  www.[email or web address] o Information on online support groups, education events, and online exercises including Yoga, Parkinson's exercises and more-LOTS of information on links to PD resources and online events o Virtual Support Group through Parkinson's Association of the [place]; next one is scheduled for Wednesday, March 15, 2020 at 2 pm. (These are typically scheduled for the 1st Wednesday of the month at 2 pm).  Visit website for details.  . Additional links for movement activities: o PWR! Moves Classes at [place] RESUMED (but are ON HOLD DUE TO COVID in February)  Contact Amy [place], PT [email or web address] or [phone] if interested o Here is a link to the PWR!Moves classes on Zoom from [phone] - Daily Mon-Sat at 10:00. Via Zoom, FREE and open to all.  There is also a link below via Facebook if you use that platform. - [place] - [email or web address] o  Parkinson's Wellness  Recovery (PWR! Moves)  www.[email or web address] - Info on the PWR! Virtual Experience:  You will have access to our expertise through self-assessment, guided plans that start with the PD-specific fundamentals, educational content, tips, Q&A with an expert, and a growing Art therapist of PD-specific pre-recorded and live exercise classes of varying types and intensity - both physical and cognitive! If that is not enough, we offer 1:1 wellness consultations (in-person or virtual) to personalize your PWR! Research scientist (medical).  - Check out the PWR! Move of the month on the [place] Recovery website:  [email or web address] o [employer] Fridays:  - As part of the PD Health @ Home program, this free video series focuses each week on one aspect of fitness designed to support people living with Parkinson's.  These weekly videos highlight the [place] recent fitness guidelines for people with Parkinson's disease. -  [email or web address].dk o Dance for PD website is offering free, live-stream classes throughout the week, as well as links to [employer] of classes:  [email or web address] o Dance for Parkinson's Class:  [place].  Free offering for people with Parkinson's and care partners; virtual class.  o For more information, contact [phone] or email Ruffin Frederick at [email or web address] o Virtual dance and Pilates for Parkinson's classes: Click on the Community Tab> Parkinson's Movement Initiative Tab.  To register for classes and for more information, visit www.[employer] and click the "community" tab.    o [employer] Parkinson's Cycling Classes  - [facility] [employer]: 1pm on Fridays-Live classes at [facility] at [email or web address] or [phone]) [phone] [facility]: Virtual Classes Mondays and Thursdays (contact  [place] at [email or web address] or [phone])   o [address] - Three levels of classes are offered Tuesdays and Thursdays:  10:30 am,  12 noon & 1:45 pm at [facility]. To observe a class or for  more information, call [phone] or email [email or web address] - PD Flow Yoga for Parkinson's:  Fridays 1-2 pm, January 7-March 10, 2020 . [employer]: o 09-09-1974 Opportunities:  www.well-springsolutions.org/caregiver-education/caregiver-support-group.  You may also contact Chief Technology Officer at [email or web address] or [phone].   o Virtual Caregiver Retreat, Monday, February 21st, 3-5 pm o Powerful Tools for Caregivers, 6-wk series for caregivers, planning to be in person, starting March 10th o [employer] Navigator:  March 06 program, a free service to help individuals and families through the journey of determining care for older adults.  The "Navigator" is a March 23, [employer], who will speak with a prospective client and/or loved ones to provide an assessment of the situation and a set of recommendations for a personalized care plan -- all free of charge, and whether [employer] offers the needed service or not. If the need is not a service we provide, we are well-connected with reputable programs in town that we can refer you to.  www.well-[email or web address] or to speak with the Navigator, call [phone].

## 2020-05-11 DIAGNOSIS — L918 Other hypertrophic disorders of the skin: Secondary | ICD-10-CM | POA: Diagnosis not present

## 2020-05-11 DIAGNOSIS — D229 Melanocytic nevi, unspecified: Secondary | ICD-10-CM | POA: Diagnosis not present

## 2020-05-11 DIAGNOSIS — L570 Actinic keratosis: Secondary | ICD-10-CM | POA: Diagnosis not present

## 2020-05-11 DIAGNOSIS — L814 Other melanin hyperpigmentation: Secondary | ICD-10-CM | POA: Diagnosis not present

## 2020-05-11 DIAGNOSIS — L819 Disorder of pigmentation, unspecified: Secondary | ICD-10-CM | POA: Diagnosis not present

## 2020-05-11 DIAGNOSIS — Z85828 Personal history of other malignant neoplasm of skin: Secondary | ICD-10-CM | POA: Diagnosis not present

## 2020-05-11 DIAGNOSIS — D1801 Hemangioma of skin and subcutaneous tissue: Secondary | ICD-10-CM | POA: Diagnosis not present

## 2020-05-11 DIAGNOSIS — L821 Other seborrheic keratosis: Secondary | ICD-10-CM | POA: Diagnosis not present

## 2020-05-11 DIAGNOSIS — I8393 Asymptomatic varicose veins of bilateral lower extremities: Secondary | ICD-10-CM | POA: Diagnosis not present

## 2020-05-11 DIAGNOSIS — L905 Scar conditions and fibrosis of skin: Secondary | ICD-10-CM | POA: Diagnosis not present

## 2020-06-19 DIAGNOSIS — N471 Phimosis: Secondary | ICD-10-CM | POA: Diagnosis not present

## 2020-08-28 NOTE — Progress Notes (Signed)
Assessment/Plan:   1.  Parkinsons Disease  -Continue carbidopa/levodopa 25/100, 2/2/1  -Following with Dr. Pearline Cables for dermatology.  2.  REM behavior disorder  -Patient does not want any medication.  Understands bedroom safety.  Seems to be doing fairly well in that regard. Subjective:   Bryce Wood was seen today in follow up for Parkinsons disease.  My previous records were reviewed prior to todays visit as well as outside records available to me. Pt reports no falls since our last visit.  No hallucinations.  No lightheadedness or near syncope.  Has been following with dermatology.  Has not had any significant acting out of the dreams.  Golf is only exercise - 4-5 days per week.  No CV exercise.  Not active dreams.  Wakes up around 3-4 am and trouble getting back to sleep but isn't tired during the day.   Current prescribed movement disorder medications: Carbidopa/levodopa 25/100, 2/2/1    ALLERGIES:  No Known Allergies  CURRENT MEDICATIONS:  Outpatient Encounter Medications as of 08/30/2020  Medication Sig   acetaminophen (TYLENOL) 650 MG CR tablet Take 650 mg by mouth 3 (three) times daily.   carbidopa-levodopa (SINEMET IR) 25-100 MG tablet 6:30am/10:30am/3:30pm   cholecalciferol (VITAMIN D) 1000 UNITS tablet Take 2,000 Units by mouth daily.   fish oil-omega-3 fatty acids 1000 MG capsule Take 1 g by mouth daily.    lisinopril (ZESTRIL) 20 MG tablet Take 1 tablet (20 mg total) by mouth daily.   TURMERIC PO Take 1 tablet by mouth in the morning and at bedtime.    No facility-administered encounter medications on file as of 08/30/2020.    Objective:   PHYSICAL EXAMINATION:    VITALS:   Vitals:   08/30/20 0808  BP: 139/79  Pulse: 68  SpO2: 98%  Weight: 187 lb (84.8 kg)  Height: '6\' 1"'$  (1.854 m)     GEN:  The patient appears stated age and is in NAD. HEENT:  Normocephalic, atraumatic.  The mucous membranes are moist. The superficial temporal arteries are without  ropiness or tenderness. CV:  RRR Lungs:  CTAB Neck/HEME:  There are no carotid bruits bilaterally.  Neurological examination:  Orientation: The patient is alert and oriented x3. Cranial nerves: There is good facial symmetry with mild facial hypomimia. The speech is fluent and clear. Soft palate rises symmetrically and there is no tongue deviation. Hearing is intact to conversational tone. Sensation: Sensation is intact to light touch throughout Motor: Strength is at least antigravity x4.  Movement examination: Tone: There is mild increased tone in the right upper extremity (stable) Abnormal movements: There is right upper extremity rest tremor Coordination:  There is mild decremation with RAM's, with any form of RAMS, including alternating supination and pronation of the forearm, hand opening and closing, finger taps, heel taps and toe taps, R. Gait and Station: The patient has no difficulty arising out of a deep-seated chair without the use of the hands. The patient is wide based and flexed at knees.  No shuffle  I have reviewed and interpreted the following labs independently    Chemistry      Component Value Date/Time   NA 139 12/24/2019 0735   K 5.2 (H) 12/24/2019 0735   CL 102 12/24/2019 0735   CO2 29 12/24/2019 0735   BUN 31 (H) 12/24/2019 0735   CREATININE 1.26 12/24/2019 0735      Component Value Date/Time   CALCIUM 10.0 12/24/2019 0735   ALKPHOS 60 12/24/2019 0735  AST 15 12/24/2019 0735   ALT 3 12/24/2019 0735   BILITOT 0.7 12/24/2019 0735       Lab Results  Component Value Date   WBC 6.2 12/24/2019   HGB 14.4 12/24/2019   HCT 43.9 12/24/2019   MCV 94.7 12/24/2019   PLT 299.0 12/24/2019    Lab Results  Component Value Date   TSH 1.68 12/24/2019     Total time spent on today's visit was 20 minutes, including both face-to-face time and nonface-to-face time.  Time included that spent on review of records (prior notes available to me/labs/imaging if  pertinent), discussing treatment and goals, answering patient's questions and coordinating care.  Cc:  Eulas Post, MD

## 2020-08-30 ENCOUNTER — Encounter: Payer: Self-pay | Admitting: Neurology

## 2020-08-30 ENCOUNTER — Ambulatory Visit: Payer: PPO | Admitting: Neurology

## 2020-08-30 ENCOUNTER — Other Ambulatory Visit: Payer: Self-pay

## 2020-08-30 VITALS — BP 139/79 | HR 68 | Ht 73.0 in | Wt 187.0 lb

## 2020-08-30 DIAGNOSIS — G20 Parkinson's disease: Secondary | ICD-10-CM

## 2020-08-30 MED ORDER — CARBIDOPA-LEVODOPA 25-100 MG PO TABS: ORAL_TABLET | ORAL | 1 refills | Status: DC

## 2020-08-30 NOTE — Patient Instructions (Signed)
Online Resources for Power over [employer]   [employer] Online Groups  Power over [employer] :   Power Over Parkinson's Patient Education Group will be Wednesday, August 10th-*Hybrid meting*- in person at [employer] location and via [place] at 2:30 pm.  PLEASE NOTE [place] over Parkinson's Meetings:  2nd Wednesdays of the month at 2 pm Contact Amy Marriott at [email or web address] if interested in participating in this online group Parkinson's Care Partners Group:    3rd Mondays, Contact Misty Paladino Atypical Parkinsonian Patient Group:   4th Wednesdays, [place] If you are interested in participating in these online groups with Misty, please contact her directly for how to join those meetings.  Her contact information is [email or web address].   [place]:  [email or web address] PD Health at Home continues:  Mindfulness Mondays, Expert Briefing Tuesdays, Wellness Wednesdays, Take Time Thursdays, Fitness Fridays -Listings for June 2022 are on the website Wellness Wednesday:  [place] for Parkinson's Disease through [place].  Wednesday, August 3rd at 1 pm. Upcoming Webinar:  Use it or Lose It-The Impact of Physical Activity in Parkinson's.  Wednesday, September 7th at 1 pm. Register for Armed forces operational officer) at [email or web address]  Please check out their website to sign up for emails and see their full online offerings  [place]:  [email or web address]  Upcoming Webinar[address] and You:  How to become a Barrister's clerk.  Thursday, August 18th at 12 pm  Check out additional information on their website to see their full online offerings  [place]:  [email or web address] Upcoming Webinar:  Depression, Anxiety, Mood and Parkinson's.   Tuesday, August 9th  at 4 pm. Care Partner Monthly Meetup.  With  12-09-1973 [place].  First Tuesday of each month, 2 pm Joy Breaks:  First Wednesday of each month, 2-3 pm. There will be art, doodling, making, crafting, listening, laughing, stories, and everything in between. No art experience necessary. No supplies required. Just show up for joy!  Register on their website. Check out additional information to Live Well Today on their website  Parkinson and Movement Disorders (PMD) Alliance:  www.[email or web address] [employer] Online:  Online Education Events Sign up for emails, which are sent weekly to give you updates on programming and online offerings  Parkinson's Association of the [place]:  www.[email or web address] Caring for Parkinson's, Caring for You Symposium, Saturday, September 10th.  [place], [place].  Symposium Registration - Parkinson Association of the [place] Information on online support groups, education events, and online exercises including Yoga, Parkinson's exercises and more-LOTS of information on links to PD resources and online events Virtual Support Group through Parkinson's Association of the [place]; next one is scheduled for Wednesday, August 3rd at 2 pm. (These are typically scheduled for the 1st Wednesday of the month at 2 pm).  Visit website for details.  Additional links for movement activities: PWR! Moves Classes at [place] RESUMED!  Wednesdays 10 and 11 am.  Contact Amy Marriott, PT [email or web address] or [phone] if interested Here is a link to the PWR!Moves classes on Zoom from [phone] - Daily Mon-Sat at 10:00. Via Zoom, FREE and open to all.  There is also a link below via Facebook if you use that platform. [place] [email or web address] Parkinson's Wellness Recovery  (PWR! Moves)  www.[email or web address] Info on the PWR! Virtual Experience:  You will have access to our expertise through self-assessment, guided plans that start with the PD-specific fundamentals, educational  content, tips, Q&A with an expert, and a growing Art therapist of PD-specific pre-recorded and live exercise classes of varying types and intensity - both physical and cognitive! If that is not enough, we offer 1:1 wellness consultations (in-person or virtual) to personalize your PWR! Research scientist (medical).  National Fridays:  As part of the PD Health @ Home program, this free video series focuses each week on one aspect of fitness designed to support people living with Parkinson's.  These weekly videos highlight the [place] recent fitness guidelines for people with Parkinson's disease.  [employer] Dance for PD website is offering free, live-stream classes throughout the week, as well as links to [employer] of classes:  [email or web address] Dance for Parkinson's Class:  [place].  Free offering for people with Parkinson's and care partners; virtual class.  For more information, contact [phone] or email Ruffin Frederick at [email or web address] Virtual dance and Pilates for Parkinson's classes: Click on the Community Tab> Parkinson's Movement Initiative Tab.  To register for classes and for more information, visit [email or web address] and click the "community" tab.    [employer] Parkinson's Cycling Classes  [employer]: 1pm on Fridays-Live classes at [employer] ([employer] at [email or web address] or [phone]) [employer]: Virtual Classes Mondays and Thursdays Jeanette Caprice classes Tuesday, Wednesday and Thursday (contact [place] at [email or web address]  or [phone])  [facility] Boxing Three levels of classes  are offered Tuesdays and Thursdays:  10:30 am,  12 noon & 1:45 pm at [facility].  Active Stretching with Paula Compton Class starting in March, on Fridays To observe a class or for  more information, call [phone] or email [email or web address] Well[employer]Spring [employer]: Online Caregiver Education Opportunities:  www.well-[email or web address]/caregiver-education/[email or web address]-support-group.  You may also contact Vickki Muff at [email or web address] or [phone].   Well-Spring Navigator:  ([phone] program, a free service to help individuals and families through the journey of determining care for older adults.  The "Navigator" is a [employer], Education officer, museum, who will speak with a prospective client and/or loved ones to provide an assessment of the situation and a set of recommendations for a personalized care plan -- all free of charge, and whether Well-Spring [employer] offers the needed service or not. If the need is not a service we provide, we are well-connected with reputable programs in town that we can refer you to.  www.well-springsolutions.org or to speak with the Navigator, call [phone].

## 2020-12-05 DIAGNOSIS — Z08 Encounter for follow-up examination after completed treatment for malignant neoplasm: Secondary | ICD-10-CM | POA: Diagnosis not present

## 2020-12-05 DIAGNOSIS — L570 Actinic keratosis: Secondary | ICD-10-CM | POA: Diagnosis not present

## 2020-12-05 DIAGNOSIS — L819 Disorder of pigmentation, unspecified: Secondary | ICD-10-CM | POA: Diagnosis not present

## 2020-12-05 DIAGNOSIS — L853 Xerosis cutis: Secondary | ICD-10-CM | POA: Diagnosis not present

## 2020-12-05 DIAGNOSIS — Z85828 Personal history of other malignant neoplasm of skin: Secondary | ICD-10-CM | POA: Diagnosis not present

## 2020-12-05 DIAGNOSIS — L821 Other seborrheic keratosis: Secondary | ICD-10-CM | POA: Diagnosis not present

## 2020-12-06 ENCOUNTER — Telehealth: Payer: Self-pay | Admitting: Family Medicine

## 2020-12-06 NOTE — Telephone Encounter (Signed)
Left message for patient to call back and schedule Medicare Annual Wellness Visit (AWV) either virtually or in office. Left  my Herbie Drape number [phone]   Last AWV ;12/21/19 please schedule at anytime with [facility] Nurse Health Advisor 1 or 2   This should be a 45 minute visit.

## 2020-12-25 ENCOUNTER — Encounter: Payer: PPO | Admitting: Family Medicine

## 2020-12-26 ENCOUNTER — Encounter: Payer: PPO | Admitting: Family Medicine

## 2021-01-01 ENCOUNTER — Ambulatory Visit (INDEPENDENT_AMBULATORY_CARE_PROVIDER_SITE_OTHER): Payer: PPO

## 2021-01-01 VITALS — Ht 74.0 in | Wt 180.0 lb

## 2021-01-01 DIAGNOSIS — Z1211 Encounter for screening for malignant neoplasm of colon: Secondary | ICD-10-CM

## 2021-01-01 DIAGNOSIS — Z0000 Encounter for general adult medical examination without abnormal findings: Secondary | ICD-10-CM

## 2021-01-01 NOTE — Patient Instructions (Signed)
[name] , Thank you for taking time to come for your Medicare Wellness Visit. I appreciate your ongoing commitment to your health goals. Please review the following plan we discussed and let me know if I can assist you in the future.   Screening recommendations/referrals: Colonoscopy: cologuard ordered today Recommended yearly ophthalmology/optometry visit for glaucoma screening and checkup Recommended yearly dental visit for hygiene and checkup  Vaccinations: Influenza vaccine: completed 11/16/2020 Pneumococcal vaccine: completed 11/19/2017 Tdap vaccine: completed 02/22/2017, due 02/23/2027 Shingles vaccine: discussed   Covid-19:  11/16/2020, 11/08/2019, 03/12/2019, 02/12/2019  Advanced directives: Please bring a copy of your POA (Power of Attorney) and/or Living Will to your next appointment.   Conditions/risks identified: none  Next appointment: Follow up in one year for your annual wellness visit.   Preventive Care 60 Years and Older, Male Preventive care refers to lifestyle choices and visits with your health care provider that can promote health and wellness. What does preventive care include? A yearly physical exam. This is also called an annual well check. Dental exams once or twice a year. Routine eye exams. Ask your health care provider how often you should have your eyes checked. Personal lifestyle choices, including: Daily care of your teeth and gums. Regular physical activity. Eating a healthy diet. Avoiding tobacco and drug use. Limiting alcohol use. Practicing safe sex. Taking low doses of aspirin every day. Taking vitamin and mineral supplements as recommended by your health care provider. What happens during an annual well check? The services and screenings done by your health care provider during your annual well check will depend on your age, overall health, lifestyle risk factors, and family history of disease. Counseling  Your health care provider may ask you  questions about your: Alcohol use. Tobacco use. Drug use. Emotional well-being. Home and relationship well-being. Sexual activity. Eating habits. History of falls. Memory and ability to understand (cognition). Work and work Statistician. Screening  You may have the following tests or measurements: Height, weight, and BMI. Blood pressure. Lipid and cholesterol levels. These may be checked every 5 years, or more frequently if you are over 80 years old. Skin check. Lung cancer screening. You may have this screening every year starting at age 57 if you have a 30-pack-year history of smoking and currently smoke or have quit within the past 15 years. Fecal occult blood test (FOBT) of the stool. You may have this test every year starting at age 50. Flexible sigmoidoscopy or colonoscopy. You may have a sigmoidoscopy every 5 years or a colonoscopy every 10 years starting at age 35. Prostate cancer screening. Recommendations will vary depending on your family history and other risks. Hepatitis C blood test. Hepatitis B blood test. Sexually transmitted disease (STD) testing. Diabetes screening. This is done by checking your blood sugar (glucose) after you have not eaten for a while (fasting). You may have this done every 1-3 years. Abdominal aortic aneurysm (AAA) screening. You may need this if you are a current or former smoker. Osteoporosis. You may be screened starting at age 46 if you are at high risk. Talk with your health care provider about your test results, treatment options, and if necessary, the need for more tests. Vaccines  Your health care provider may recommend certain vaccines, such as: Influenza vaccine. This is recommended every year. Tetanus, diphtheria, and acellular pertussis (Tdap, Td) vaccine. You may need a Td booster every 10 years. Zoster vaccine. You may need this after age 80. Pneumococcal 13-valent conjugate (PCV13) vaccine. One dose  is recommended after age  47. Pneumococcal polysaccharide (PPSV23) vaccine. One dose is recommended after age 104. Talk to your health care provider about which screenings and vaccines you need and how often you need them. This information is not intended to replace advice given to you by your health care provider. Make sure you discuss any questions you have with your health care provider. Document Released: 01/27/2015 Document Revised: 09/20/2015 Document Reviewed: 11/01/2014 Elsevier Interactive Patient Education  2017 [place] Prevention in the Home Falls can cause injuries. They can happen to people of all ages. There are many things you can do to make your home safe and to help prevent falls. What can I do on the outside of my home? Regularly fix the edges of walkways and driveways and fix any cracks. Remove anything that might make you trip as you walk through a door, such as a raised step or threshold. Trim any bushes or trees on the path to your home. Use bright outdoor lighting. Clear any walking paths of anything that might make someone trip, such as rocks or tools. Regularly check to see if handrails are loose or broken. Make sure that both sides of any steps have handrails. Any raised decks and porches should have guardrails on the edges. Have any leaves, snow, or ice cleared regularly. Use sand or salt on walking paths during winter. Clean up any spills in your garage right away. This includes oil or grease spills. What can I do in the bathroom? Use night lights. Install grab bars by the toilet and in the tub and shower. Do not use towel bars as grab bars. Use non-skid mats or decals in the tub or shower. If you need to sit down in the shower, use a plastic, non-slip stool. Keep the floor dry. Clean up any water that spills on the floor as soon as it happens. Remove soap buildup in the tub or shower regularly. Attach bath mats securely with double-sided non-slip rug tape. Do not have throw  rugs and other things on the floor that can make you trip. What can I do in the bedroom? Use night lights. Make sure that you have a light by your bed that is easy to reach. Do not use any sheets or blankets that are too big for your bed. They should not hang down onto the floor. Have a firm chair that has side arms. You can use this for support while you get dressed. Do not have throw rugs and other things on the floor that can make you trip. What can I do in the kitchen? Clean up any spills right away. Avoid walking on wet floors. Keep items that you use a lot in easy-to-reach places. If you need to reach something above you, use a strong step stool that has a grab bar. Keep electrical cords out of the way. Do not use floor polish or wax that makes floors slippery. If you must use wax, use non-skid floor wax. Do not have throw rugs and other things on the floor that can make you trip. What can I do with my stairs? Do not leave any items on the stairs. Make sure that there are handrails on both sides of the stairs and use them. Fix handrails that are broken or loose. Make sure that handrails are as long as the stairways. Check any carpeting to make sure that it is firmly attached to the stairs. Fix any carpet that is loose or worn. Avoid  having throw rugs at the top or bottom of the stairs. If you do have throw rugs, attach them to the floor with carpet tape. Make sure that you have a light switch at the top of the stairs and the bottom of the stairs. If you do not have them, ask someone to add them for you. What else can I do to help prevent falls? Wear shoes that: Do not have high heels. Have rubber bottoms. Are comfortable and fit you well. Are closed at the toe. Do not wear sandals. If you use a stepladder: Make sure that it is fully opened. Do not climb a closed stepladder. Make sure that both sides of the stepladder are locked into place. Ask someone to hold it for you, if  possible. Clearly mark and make sure that you can see: Any grab bars or handrails. First and last steps. Where the edge of each step is. Use tools that help you move around (mobility aids) if they are needed. These include: Canes. Walkers. Scooters. Crutches. Turn on the lights when you go into a dark area. Replace any light bulbs as soon as they burn out. Set up your furniture so you have a clear path. Avoid moving your furniture around. If any of your floors are uneven, fix them. If there are any pets around you, be aware of where they are. Review your medicines with your doctor. Some medicines can make you feel dizzy. This can increase your chance of falling. Ask your doctor what other things that you can do to help prevent falls. This information is not intended to replace advice given to you by your health care provider. Make sure you discuss any questions you have with your health care provider. Document Released: 10/27/2008 Document Revised: 06/08/2015 Document Reviewed: 02/04/2014 Elsevier Interactive Patient Education  2017 [employer].

## 2021-01-01 NOTE — Progress Notes (Signed)
I connected with Hardie Shackleton today by telephone and verified that I am speaking with the correct person using two identifiers. Location patient: home Location provider: work Persons participating in the virtual visit: Hardie Shackleton, Glenna Durand LPN.   I discussed the limitations, risks, security and privacy concerns of performing an evaluation and management service by telephone and the availability of in person appointments. I also discussed with the patient that there may be a patient responsible charge related to this service. The patient expressed understanding and verbally consented to this telephonic visit.    Interactive audio and video telecommunications were attempted between this provider and patient, however failed, due to patient having technical difficulties OR patient did not have access to video capability.  We continued and completed visit with audio only.     Vital signs may be patient reported or missing.  Subjective:   Chang Tiggs is a 75 y.o. male who presents for Medicare Annual/Subsequent preventive examination.  Review of Systems     Cardiac Risk Factors include: advanced age (>27men, >39 women);dyslipidemia;hypertension;male gender     Objective:    Today's Vitals   01/01/21 1026  Weight: 180 lb (81.6 kg)  Height: 6\' 2"  (1.88 m)   Body mass index is 23.11 kg/m.  Advanced Directives 01/01/2021 08/30/2020 03/08/2020 12/21/2019 04/22/2019 04/02/2019 07/30/2018  Does Patient Have a Medical Advance Directive? Yes Yes Yes Yes - Yes Yes  Type of Advance Directive [place];Living will Living will [place];Living will [place];Living will [place];Living will [place];Living will Living will;Healthcare Power of Attorney  Does patient want to make changes to medical advance directive? - - - No - Patient declined - - -  Copy of [place] in Chart? No  - copy requested - - No - copy requested Yes - validated most recent copy scanned in chart (See row information) - -    Current Medications (verified) Outpatient Encounter Medications as of 01/01/2021  Medication Sig   acetaminophen (TYLENOL) 650 MG CR tablet Take 650 mg by mouth 3 (three) times daily.   carbidopa-levodopa (SINEMET IR) 25-100 MG tablet 6:30am/10:30am/3:30pm   cholecalciferol (VITAMIN D) 1000 UNITS tablet Take 2,000 Units by mouth daily.   fish oil-omega-3 fatty acids 1000 MG capsule Take 1 g by mouth daily.    lisinopril (ZESTRIL) 20 MG tablet Take 1 tablet (20 mg total) by mouth daily.   TURMERIC PO Take 1 tablet by mouth in the morning and at bedtime.    No facility-administered encounter medications on file as of 01/01/2021.    Allergies (verified) Patient has no known allergies.   History: Past Medical History:  Diagnosis Date   ARTHRITIS 08/01/2008   Cancer ([place]) 01/2019   basal cell removed from face   HYPERLIPIDEMIA 11/21/2008   HYPERTENSION 08/01/2008   OSTEOARTHRITIS 08/01/2008   Parkinson's disease ([place])    Past Surgical History:  Procedure Laterality Date   CIRCUMCISION N/A 04/22/2019   Procedure: CIRCUMCISION ADULT;  Surgeon: [name], MD;  Location: [facility];  Service: Urology;  Laterality: N/A;   circumcison     HERNIA REPAIR  1979   inguinal   SPINE SURGERY  1999   disk l4 to l5   Family History  Problem Relation Age of Onset   Heart disease Mother    Heart attack Mother    Hypertension Father    Stroke Father    Leukemia Father  Healthy Daughter    Cancer Paternal Uncle 28       prostate cancer   Hypertension Brother    Social History   Socioeconomic History   Marital status: Married    Spouse name: Not on file   Number of children: 1   Years of education: Not on file   Highest education level: Some college, no degree  Occupational History   Occupation: retired    Comment: Lawyer  Tobacco Use    Smoking status: Never   Smokeless tobacco: Never  Vaping Use   Vaping Use: Never used  Substance and Sexual Activity   Alcohol use: No   Drug use: No   Sexual activity: Not on file  Other Topics Concern   Not on file  Social History Narrative   RIGHT HANDED   [place]   Social Determinants of Health   Financial Resource Strain: Low Risk    Difficulty of Paying Living Expenses: Not hard at all  Food Insecurity: No Food Insecurity   Worried About Charity fundraiser in the Last Year: Never true   [place] in the Last Year: Never true  Transportation Needs: No Transportation Needs   Lack of Transportation (Medical): No   Lack of Transportation (Non-Medical): No  Physical Activity: Sufficiently Active   Days of Exercise per Week: 5 days   Minutes of Exercise per Session: 150+ min  Stress: No Stress Concern Present   Feeling of Stress : Not at all  Social Connections: Not on file    Tobacco Counseling Counseling given: Not Answered   Clinical Intake:  Pre-visit preparation completed: Yes  Pain : No/denies pain     Nutritional Status: BMI of 19-24  Normal Nutritional Risks: None Diabetes: No  How often do you need to have someone help you when you read instructions, pamphlets, or other written materials from your doctor or pharmacy?: 1 - Never What is the last grade level you completed in school?: 81yr college  Diabetic? no  Interpreter Needed?: No  Information entered by :: NAllen LPN   Activities of Daily Living In your present state of health, do you have any difficulty performing the following activities: 01/01/2021  Hearing? N  Vision? N  Difficulty concentrating or making decisions? N  Walking or climbing stairs? N  Dressing or bathing? N  Doing errands, shopping? N  Preparing Food and eating ? N  Using the Toilet? N  In the past six months, have you accidently leaked urine? N  Do you have problems with loss of bowel  control? N  Managing your Medications? N  Managing your Finances? N  Housekeeping or managing your Housekeeping? N  Some recent data might be hidden    Patient Care Team: Eulas Post, MD as PCP - General  Indicate any recent Medical Services you may have received from other than [facility] providers in the past year (date may be approximate).     Assessment:   This is a routine wellness examination for Shannon.  Hearing/Vision screen Vision Screening - Comments:: Regular eye exams, [employer]  Dietary issues and exercise activities discussed: Current Exercise Habits: Home exercise routine, Type of exercise: Other - see comments (golf), Time (Minutes): > 60, Frequency (Times/Week): 4, Weekly Exercise (Minutes/Week): 0   Goals Addressed             This Visit's Progress    Patient Stated  01/01/2021, stay mentally and physically active       Depression Screen PHQ 2/9 Scores 01/01/2021 12/24/2019 12/21/2019 12/21/2018 01/03/2017 09/04/2016 12/20/2015  PHQ - 2 Score 0 0 0 0 0 0 0  PHQ- 9 Score - 0 0 0 - - -    Fall Risk Fall Risk  01/01/2021 08/30/2020 03/08/2020 12/24/2019 12/21/2019  Falls in the past year? 0 0 0 1 1  Number falls in past yr: - 0 0 1 1  Injury with Fall? - 0 0 0 0  Risk for fall due to : Medication side effect - - - Impaired balance/gait  Follow up Falls evaluation completed;Education provided;Falls prevention discussed - - - Falls evaluation completed;Falls prevention discussed    FALL RISK PREVENTION PERTAINING TO THE HOME:  Any stairs in or around the home? No  If so, are there any without handrails? N/a Home free of loose throw rugs in walkways, pet beds, electrical cords, etc? Yes  Adequate lighting in your home to reduce risk of falls? Yes   ASSISTIVE DEVICES UTILIZED TO PREVENT FALLS:  Life alert? No  Use of a cane, walker or w/c? No  Grab bars in the bathroom? No  Shower chair or bench in shower? No  Elevated toilet seat or a  handicapped toilet? Yes   TIMED UP AND GO:  Was the test performed? No .      Cognitive Function: MMSE - Mini Mental State Exam 01/03/2017 12/20/2015  Not completed: (No Data) (No Data)     6CIT Screen 01/01/2021  What Year? 0 points  What month? 0 points  What time? 0 points  Count back from 20 0 points  Months in reverse 2 points  Repeat phrase 2 points  Total Score 4    Immunizations Immunization History  Administered Date(s) Administered   Fluad Quad(high Dose 65+) 11/07/2019, 11/16/2020   Influenza, High Dose Seasonal PF 10/18/2015, 09/28/2016, 11/19/2017, 09/14/2018   Influenza-Unspecified 11/14/2016, 11/07/2019   Moderna Covid-19 Vaccine Bivalent Booster 70yrs & up 11/16/2020   Moderna Sars-Covid-2 Vaccination 02/12/2019, 03/12/2019, 11/08/2019   Pneumococcal Conjugate-13 11/19/2017   Pneumococcal Polysaccharide-23 02/11/2012   Td 09/18/2001, 01/14/2006   Tdap 02/22/2017    TDAP status: Up to date  Flu Vaccine status: Up to date  Pneumococcal vaccine status: Up to date  Covid-19 vaccine status: Completed vaccines  Qualifies for Shingles Vaccine? Yes   Zostavax completed No   Shingrix Completed?: No.    Education has been provided regarding the importance of this vaccine. Patient has been advised to call insurance company to determine out of pocket expense if they have not yet received this vaccine. Advised may also receive vaccine at local pharmacy or Health Dept. Verbalized acceptance and understanding.  Screening Tests Health Maintenance  Topic Date Due   Zoster Vaccines- Shingrix (1 of 2) Never done   Fecal DNA (Cologuard)  09/06/2018   TETANUS/TDAP  02/23/2027   Pneumonia Vaccine 10+ Years old  Completed   INFLUENZA VACCINE  Completed   COVID-19 Vaccine  Completed   Hepatitis C Screening  Completed   HPV VACCINES  Aged Out    Health Maintenance  Health Maintenance Due  Topic Date Due   Zoster Vaccines- Shingrix (1 of 2) Never done   Fecal  DNA (Cologuard)  09/06/2018    Colorectal cancer screening: order for cologuard  Lung Cancer Screening: (Low Dose CT Chest recommended if Age 47-80 years, 30 pack-year currently smoking OR have quit w/in 15years.) does not qualify.  Lung Cancer Screening Referral: no  Additional Screening:  Hepatitis C Screening: does qualify; Completed 09/04/2016  Vision Screening: Recommended annual ophthalmology exams for early detection of glaucoma and other disorders of the eye. Is the patient up to date with their annual eye exam?  Yes  Who is the provider or what is the name of the office in which the patient attends annual eye exams? [place] If pt is not established with a provider, would they like to be referred to a provider to establish care? No .   Dental Screening: Recommended annual dental exams for proper oral hygiene  Community Resource Referral / Chronic Care Management: CRR required this visit?  No   CCM required this visit?  No      Plan:     I have personally reviewed and noted the following in the patients chart:   Medical and social history Use of alcohol, tobacco or illicit drugs  Current medications and supplements including opioid prescriptions. Patient is not currently taking opioid prescriptions. Functional ability and status Nutritional status Physical activity Advanced directives List of other physicians Hospitalizations, surgeries, and ER visits in previous 12 months Vitals Screenings to include cognitive, depression, and falls Referrals and appointments  In addition, I have reviewed and discussed with patient certain preventive protocols, quality metrics, and best practice recommendations. A written personalized care plan for preventive services as well as general preventive health recommendations were provided to patient.     Kellie Simmering, LPN   [id number]   Nurse Notes: none

## 2021-01-03 ENCOUNTER — Ambulatory Visit (INDEPENDENT_AMBULATORY_CARE_PROVIDER_SITE_OTHER): Payer: PPO | Admitting: Family Medicine

## 2021-01-03 ENCOUNTER — Encounter: Payer: Self-pay | Admitting: Family Medicine

## 2021-01-03 VITALS — BP 130/70 | HR 60 | Temp 97.8°F | Ht 74.0 in | Wt 186.4 lb

## 2021-01-03 DIAGNOSIS — Z0000 Encounter for general adult medical examination without abnormal findings: Secondary | ICD-10-CM

## 2021-01-03 LAB — HEPATIC FUNCTION PANEL
ALT: 3 U/L (ref 0–53)
AST: 19 U/L (ref 0–37)
Albumin: 4.6 g/dL (ref 3.5–5.2)
Alkaline Phosphatase: 51 U/L (ref 39–117)
Bilirubin, Direct: 0.1 mg/dL (ref 0.0–0.3)
Total Bilirubin: 0.7 mg/dL (ref 0.2–1.2)
Total Protein: 7.4 g/dL (ref 6.0–8.3)

## 2021-01-03 LAB — CBC WITH DIFFERENTIAL/PLATELET
Basophils Absolute: 0 10*3/uL (ref 0.0–0.1)
Basophils Relative: 0.7 % (ref 0.0–3.0)
Eosinophils Absolute: 0.1 10*3/uL (ref 0.0–0.7)
Eosinophils Relative: 1.9 % (ref 0.0–5.0)
HCT: 42.2 % (ref 39.0–52.0)
Hemoglobin: 13.9 g/dL (ref 13.0–17.0)
Lymphocytes Relative: 35.3 % (ref 12.0–46.0)
Lymphs Abs: 1.7 10*3/uL (ref 0.7–4.0)
MCHC: 32.9 g/dL (ref 30.0–36.0)
MCV: 93.8 fl (ref 78.0–100.0)
Monocytes Absolute: 0.6 10*3/uL (ref 0.1–1.0)
Monocytes Relative: 12.5 % — ABNORMAL HIGH (ref 3.0–12.0)
Neutro Abs: 2.3 10*3/uL (ref 1.4–7.7)
Neutrophils Relative %: 49.6 % (ref 43.0–77.0)
Platelets: 251 10*3/uL (ref 150.0–400.0)
RBC: 4.5 Mil/uL (ref 4.22–5.81)
RDW: 13.1 % (ref 11.5–15.5)
WBC: 4.7 10*3/uL (ref 4.0–10.5)

## 2021-01-03 LAB — BASIC METABOLIC PANEL
BUN: 17 mg/dL (ref 6–23)
CO2: 28 mEq/L (ref 19–32)
Calcium: 9.9 mg/dL (ref 8.4–10.5)
Chloride: 103 mEq/L (ref 96–112)
Creatinine, Ser: 1.08 mg/dL (ref 0.40–1.50)
GFR: 67.79 mL/min (ref >60.00)
Glucose, Bld: 95 mg/dL (ref 70–99)
Potassium: 4.1 mEq/L (ref 3.5–5.1)
Sodium: 139 mEq/L (ref 135–145)

## 2021-01-03 LAB — TSH: TSH: 1.66 u[IU]/mL (ref 0.35–5.50)

## 2021-01-03 LAB — LIPID PANEL
Cholesterol: 190 mg/dL (ref 0–200)
HDL: 53.2 mg/dL (ref >39.00)
LDL Cholesterol: 112 mg/dL — ABNORMAL HIGH (ref 0–99)
NonHDL: 136.52
Total CHOL/HDL Ratio: 4
Triglycerides: 125 mg/dL (ref 0.0–149.0)
VLDL: 25 mg/dL (ref 0.0–40.0)

## 2021-01-03 MED ORDER — LISINOPRIL 20 MG PO TABS: 20.0000 mg | ORAL_TABLET | Freq: Every day | ORAL | 3 refills | Status: DC

## 2021-01-03 NOTE — Progress Notes (Signed)
Established Patient Office Visit  Subjective:  Patient ID: Bryce Wood, male    DOB: 1945-09-25  Age: 75 y.o. MRN: [id number]  CC:  Chief Complaint  Patient presents with   Annual Exam    HPI Bryce Wood presents for physical exam.  He has history of hypertension, Parkinson's disease, degenerative arthritis involving multiple joints, history of mild hyperlipidemia.  He is followed regularly by neurology and dermatology.  He remains on Sinemet.  Takes lisinopril for hypertension.  Blood pressures have been stable.  No recent orthostatic symptoms.  He continues to play golf about 4 days/week.  He works 1 day/week at [employer].  Denies recent fall.  Health maintenance reviewed  -Due for repeat Cologuard and this has been ordered -Flu vaccine already given -Pneumonia vaccines complete -He declines Shingrix vaccine -Tetanus due 2029 -Previous hepatitis C screen negative  Family history-reviewed with no significant changes.  His father had leukemia and died of stroke complications.  He has a brother who has hypertension history.  Does not stay in touch much with him.  His mother apparently died of heart disease complications around age 74.  Social history-he is basically retired but works 1 day/week at [employer].  No history of smoking.  No regular alcohol.  He has 1 daughter and no grandchildren.  Patient is married.  Past Medical History:  Diagnosis Date   ARTHRITIS 08/01/2008   Cancer ([place]) 01/2019   basal cell removed from face   HYPERLIPIDEMIA 11/21/2008   HYPERTENSION 08/01/2008   OSTEOARTHRITIS 08/01/2008   Parkinson's disease ([place])     Past Surgical History:  Procedure Laterality Date   CIRCUMCISION N/A 04/22/2019   Procedure: CIRCUMCISION ADULT;  Surgeon: [name], MD;  Location[facility];  Service: Urology;  Laterality: N/A;   circumcison     HERNIA REPAIR  1979   inguinal   SPINE SURGERY  1999   disk l4 to l5    Family History  Problem  Relation Age of Onset   Heart disease Mother    Heart attack Mother    Hypertension Father    Stroke Father    Leukemia Father    Healthy Daughter    Cancer Paternal Uncle 25       prostate cancer   Hypertension Brother     Social History   Socioeconomic History   Marital status: Married    Spouse name: Not on file   Number of children: 1   Years of education: Not on file   Highest education level: Some college, no degree  Occupational History   Occupation: retired    Comment: Lawyer  Tobacco Use   Smoking status: Never   Smokeless tobacco: Never  Vaping Use   Vaping Use: Never used  Substance and Sexual Activity   Alcohol use: No   Drug use: No   Sexual activity: Not on file  Other Topics Concern   Not on file  Social History Narrative   RIGHT HANDED   [place]   Social Determinants of Health   Financial Resource Strain: Low Risk    Difficulty of Paying Living Expenses: Not hard at all  Food Insecurity: No Food Insecurity   Worried About Charity fundraiser in the Last Year: Never true   [place] in the Last Year: Never true  Transportation Needs: No Transportation Needs   Lack of Transportation (Medical): No   Lack of Transportation (  Non-Medical): No  Physical Activity: Sufficiently Active   Days of Exercise per Week: 5 days   Minutes of Exercise per Session: 150+ min  Stress: No Stress Concern Present   Feeling of Stress : Not at all  Social Connections: Not on file  Intimate Partner Violence: Not on file    Outpatient Medications Prior to Visit  Medication Sig Dispense Refill   acetaminophen (TYLENOL) 650 MG CR tablet Take 650 mg by mouth 3 (three) times daily.     carbidopa-levodopa (SINEMET IR) 25-100 MG tablet 6:30am/10:30am/3:30pm 450 tablet 1   cholecalciferol (VITAMIN D) 1000 UNITS tablet Take 2,000 Units by mouth daily.     fish oil-omega-3 fatty acids 1000 MG capsule Take 1 g by mouth daily.       TURMERIC PO Take 1 tablet by mouth in the morning and at bedtime.      lisinopril (ZESTRIL) 20 MG tablet Take 1 tablet (20 mg total) by mouth daily. 90 tablet 3   No facility-administered medications prior to visit.    No Known Allergies  ROS Review of Systems  Constitutional:  Negative for activity change, appetite change, fatigue and fever.  HENT:  Negative for congestion, ear pain and trouble swallowing.   Eyes:  Negative for pain and visual disturbance.  Respiratory:  Negative for cough, shortness of breath and wheezing.   Cardiovascular:  Negative for chest pain and palpitations.  Gastrointestinal:  Negative for abdominal distention, abdominal pain, blood in stool, constipation, diarrhea, nausea, rectal pain and vomiting.  Endocrine: Negative for polydipsia and polyuria.  Genitourinary:  Negative for dysuria, hematuria and testicular pain.  Musculoskeletal:  Negative for arthralgias and joint swelling.  Skin:  Negative for rash.  Neurological:  Positive for tremors. Negative for dizziness, syncope and headaches.  Hematological:  Negative for adenopathy.  Psychiatric/Behavioral:  Negative for confusion and dysphoric mood.      Objective:    Physical Exam Constitutional:      General: He is not in acute distress.    Appearance: He is well-developed.  HENT:     Head: Normocephalic and atraumatic.     Right Ear: External ear normal.     Left Ear: External ear normal.  Eyes:     Conjunctiva/sclera: Conjunctivae normal.     Pupils: Pupils are equal, round, and reactive to light.  Neck:     Thyroid: No thyromegaly.  Cardiovascular:     Rate and Rhythm: Normal rate and regular rhythm.     Heart sounds: Normal heart sounds. No murmur heard. Pulmonary:     Effort: No respiratory distress.     Breath sounds: No wheezing or rales.  Abdominal:     General: Bowel sounds are normal. There is no distension.     Palpations: Abdomen is soft. There is no mass.     Tenderness: There  is no abdominal tenderness. There is no guarding or rebound.  Musculoskeletal:     Cervical back: Normal range of motion and neck supple.  Lymphadenopathy:     Cervical: No cervical adenopathy.  Skin:    Findings: No rash.  Neurological:     Mental Status: He is alert and oriented to person, place, and time.     Cranial Nerves: No cranial nerve deficit.     Comments: He has resting tremors consistent with his Parkinson's disease.    BP 130/70 (BP Location: Left Arm, Patient Position: Sitting, Cuff Size: Normal)    Pulse 60    Temp 97.8 F (  36.6 C) (Oral)    Ht 6\' 2"  (1.88 m)    Wt 186 lb 6.4 oz (84.6 kg)    SpO2 99%    BMI 23.93 kg/m  Wt Readings from Last 3 Encounters:  01/03/21 186 lb 6.4 oz (84.6 kg)  01/01/21 180 lb (81.6 kg)  08/30/20 187 lb (84.8 kg)     Health Maintenance Due  Topic Date Due   Fecal DNA (Cologuard)  09/06/2018    There are no preventive care reminders to display for this patient.  Lab Results  Component Value Date   TSH 1.68 12/24/2019   Lab Results  Component Value Date   WBC 6.2 12/24/2019   HGB 14.4 12/24/2019   HCT 43.9 12/24/2019   MCV 94.7 12/24/2019   PLT 299.0 12/24/2019   Lab Results  Component Value Date   NA 139 12/24/2019   K 5.2 (H) 12/24/2019   CO2 29 12/24/2019   GLUCOSE 80 12/24/2019   BUN 31 (H) 12/24/2019   CREATININE 1.26 12/24/2019   BILITOT 0.7 12/24/2019   ALKPHOS 60 12/24/2019   AST 15 12/24/2019   ALT 3 12/24/2019   PROT 7.3 12/24/2019   ALBUMIN 4.6 12/24/2019   CALCIUM 10.0 12/24/2019   GFR 56.75 (L) 12/24/2019   Lab Results  Component Value Date   CHOL 181 12/24/2019   Lab Results  Component Value Date   HDL 60.60 12/24/2019   Lab Results  Component Value Date   LDLCALC 108 (H) 12/24/2019   Lab Results  Component Value Date   TRIG 65.0 12/24/2019   Lab Results  Component Value Date   CHOLHDL 3 12/24/2019   No results found for: HGBA1C    Assessment & Plan:   Problem List Items  Addressed This Visit   None Visit Diagnoses     Physical exam    -  Primary   Relevant Orders   Basic metabolic panel   Lipid panel   CBC with Differential/Platelet   TSH   Hepatic function panel     -Flu vaccine already given -We discussed Shingrix vaccine he declines -Cologuard has been ordered -Obtain screening labs as above -Refill lisinopril for 1 year. -Would not recommend further PSA screening with his age  Meds ordered this encounter  Medications   lisinopril (ZESTRIL) 20 MG tablet    Sig: Take 1 tablet (20 mg total) by mouth daily.    Dispense:  90 tablet    Refill:  3    Follow-up: No follow-ups on file.    Carolann Littler, MD

## 2021-01-12 DIAGNOSIS — Z1211 Encounter for screening for malignant neoplasm of colon: Secondary | ICD-10-CM | POA: Diagnosis not present

## 2021-01-22 LAB — COLOGUARD: COLOGUARD: NEGATIVE

## 2021-02-21 ENCOUNTER — Other Ambulatory Visit: Payer: Self-pay | Admitting: Neurology

## 2021-02-21 DIAGNOSIS — G20 Parkinson's disease: Secondary | ICD-10-CM

## 2021-03-06 NOTE — Progress Notes (Signed)
Assessment/Plan:   1.  Parkinsons Disease  -Continue carbidopa/levodopa 25/100, 2/2/1  -pt underdosed today and long discussion regarding addition of pramipexole.  Patient is more rigid today and dragging the leg.  Ultimately, he really did not want to add more medication.  We can discuss further in the future.  There are risks to dopamine agonist in this age group, which he and I had discussed today as well.  -Following with Dr. Pearline Cables for dermatology.  2.  REM behavior disorder  -Patient does not want any medication.  Understands bedroom safety.  Seems to be doing fairly well in that regard. Subjective:   Kavir Savoca was seen today in follow up for Parkinsons disease.  My previous records were reviewed prior to todays visit as well as outside records available to me. Pt reports no falls since our last visit.  No hallucinations.  No lightheadedness or near syncope.  Still able to play golf multiple days per week.  He continues to follow with dermatology, with last visit being in November.  Saw Dr. Elease Hashimoto in December for his physical exam.  No falling out of the bed.     Current prescribed movement disorder medications: Carbidopa/levodopa 25/100, 2/2/1    ALLERGIES:  No Known Allergies  CURRENT MEDICATIONS:  Outpatient Encounter Medications as of 03/07/2021  Medication Sig   acetaminophen (TYLENOL) 650 MG CR tablet Take 650 mg by mouth 3 (three) times daily.   carbidopa-levodopa (SINEMET IR) 25-100 MG tablet TAKE 2 TABLETS BY MOUTH AT 6:30AM, 2 TABS AT 10:30AM AND 1 TAB AT 3:30PM   cholecalciferol (VITAMIN D) 1000 UNITS tablet Take 2,000 Units by mouth daily.   fish oil-omega-3 fatty acids 1000 MG capsule Take 1 g by mouth daily.    lisinopril (ZESTRIL) 20 MG tablet Take 1 tablet (20 mg total) by mouth daily.   TURMERIC PO Take 1 tablet by mouth in the morning and at bedtime.    No facility-administered encounter medications on file as of 03/07/2021.    Objective:    PHYSICAL EXAMINATION:    VITALS:   Vitals:   03/07/21 0840  BP: 126/76  Pulse: 65  SpO2: 94%  Weight: 185 lb 6.4 oz (84.1 kg)  Height: 6\' 1"  (1.854 m)      GEN:  The patient appears stated age and is in NAD. HEENT:  Normocephalic, atraumatic.  The mucous membranes are moist. The superficial temporal arteries are without ropiness or tenderness. CV:  RRR Lungs:  CTAB Neck/HEME:  There are no carotid bruits bilaterally.  Neurological examination:  Orientation: The patient is alert and oriented x3. Cranial nerves: There is good facial symmetry with mild facial hypomimia. The speech is fluent and clear. Soft palate rises symmetrically and there is no tongue deviation. Hearing is intact to conversational tone. Sensation: Sensation is intact to light touch throughout Motor: Strength is at least antigravity x4.  Movement examination: Tone: There is mod increased tone in the RUE (worse) Abnormal movements: There is right upper extremity rest tremor Coordination:  There is mild decremation with RAM's, with any form of RAMS, including alternating supination and pronation of the forearm, hand opening and closing, finger taps, heel taps and toe taps, R. Gait and Station: The patient has no difficulty arising out of a deep-seated chair without the use of the hands. The patient is wide based and flexed at knees. Mildly shuffle and drags R leg a little  I have reviewed and interpreted the following labs independently  Chemistry      Component Value Date/Time   NA 139 01/03/2021 0737   K 4.1 01/03/2021 0737   CL 103 01/03/2021 0737   CO2 28 01/03/2021 0737   BUN 17 01/03/2021 0737   CREATININE 1.08 01/03/2021 0737      Component Value Date/Time   CALCIUM 9.9 01/03/2021 0737   ALKPHOS 51 01/03/2021 0737   AST 19 01/03/2021 0737   ALT 3 01/03/2021 0737   BILITOT 0.7 01/03/2021 0737       Lab Results  Component Value Date   WBC 4.7 01/03/2021   HGB 13.9 01/03/2021   HCT  42.2 01/03/2021   MCV 93.8 01/03/2021   PLT 251.0 01/03/2021    Lab Results  Component Value Date   TSH 1.66 01/03/2021     Cc:  Eulas Post, MD

## 2021-03-07 ENCOUNTER — Ambulatory Visit: Payer: PPO | Admitting: Neurology

## 2021-03-07 ENCOUNTER — Other Ambulatory Visit: Payer: Self-pay

## 2021-03-07 ENCOUNTER — Encounter: Payer: Self-pay | Admitting: Neurology

## 2021-03-07 VITALS — BP 126/76 | HR 65 | Ht 73.0 in | Wt 185.4 lb

## 2021-03-07 DIAGNOSIS — G20 Parkinson's disease: Secondary | ICD-10-CM

## 2021-05-19 ENCOUNTER — Other Ambulatory Visit: Payer: Self-pay | Admitting: Neurology

## 2021-05-19 DIAGNOSIS — G20 Parkinson's disease: Secondary | ICD-10-CM

## 2021-05-30 DIAGNOSIS — C44712 Basal cell carcinoma of skin of right lower limb, including hip: Secondary | ICD-10-CM | POA: Diagnosis not present

## 2021-05-30 DIAGNOSIS — D485 Neoplasm of uncertain behavior of skin: Secondary | ICD-10-CM | POA: Diagnosis not present

## 2021-06-05 DIAGNOSIS — D225 Melanocytic nevi of trunk: Secondary | ICD-10-CM | POA: Diagnosis not present

## 2021-06-05 DIAGNOSIS — L570 Actinic keratosis: Secondary | ICD-10-CM | POA: Diagnosis not present

## 2021-06-05 DIAGNOSIS — L814 Other melanin hyperpigmentation: Secondary | ICD-10-CM | POA: Diagnosis not present

## 2021-06-05 DIAGNOSIS — L821 Other seborrheic keratosis: Secondary | ICD-10-CM | POA: Diagnosis not present

## 2021-07-03 NOTE — Progress Notes (Unsigned)
   I, Wendy Poet, LAT, ATC, am serving as scribe for Dr. Lynne Leader.  Adonay Scheier is a 76 y.o. male who presents to [place] [facility] today for f/u of chronic L shoulder pain due to OA/DJD.  He was last seen by Dr. Tamala Julian on 12/13/19 and had a L GHJ steroid injection.  He was advised to con't his HEP.  Today, pt reports   Diagnostic testing: L shoulder XR- 12/13/19  Pertinent review of systems: ***  Relevant historical information: ***   Exam:  There were no vitals taken for this visit. General: Well Developed, well nourished, and in no acute distress.   MSK: ***    Lab and Radiology Results No results found for this or any previous visit (from the past 72 hour(s)). No results found.     Assessment and Plan: 76 y.o. male with ***   PDMP not reviewed this encounter. No orders of the defined types were placed in this encounter.  No orders of the defined types were placed in this encounter.    Discussed warning signs or symptoms. Please see discharge instructions. Patient expresses understanding.   ***

## 2021-07-09 ENCOUNTER — Ambulatory Visit: Payer: PPO | Admitting: Family Medicine

## 2021-07-09 ENCOUNTER — Encounter: Payer: Self-pay | Admitting: Family Medicine

## 2021-07-09 ENCOUNTER — Ambulatory Visit: Payer: Self-pay

## 2021-07-09 VITALS — BP 142/80 | HR 62 | Ht 73.0 in | Wt 182.0 lb

## 2021-07-09 DIAGNOSIS — G8929 Other chronic pain: Secondary | ICD-10-CM

## 2021-07-09 DIAGNOSIS — M25512 Pain in left shoulder: Secondary | ICD-10-CM | POA: Diagnosis not present

## 2021-07-09 DIAGNOSIS — M19012 Primary osteoarthritis, left shoulder: Secondary | ICD-10-CM

## 2021-07-09 DIAGNOSIS — L570 Actinic keratosis: Secondary | ICD-10-CM | POA: Diagnosis not present

## 2021-07-26 DIAGNOSIS — I872 Venous insufficiency (chronic) (peripheral): Secondary | ICD-10-CM | POA: Diagnosis not present

## 2021-07-26 DIAGNOSIS — C44712 Basal cell carcinoma of skin of right lower limb, including hip: Secondary | ICD-10-CM | POA: Diagnosis not present

## 2021-07-31 DIAGNOSIS — H0100A Unspecified blepharitis right eye, upper and lower eyelids: Secondary | ICD-10-CM | POA: Diagnosis not present

## 2021-07-31 DIAGNOSIS — H0100B Unspecified blepharitis left eye, upper and lower eyelids: Secondary | ICD-10-CM | POA: Diagnosis not present

## 2021-09-06 ENCOUNTER — Ambulatory Visit: Payer: PPO | Admitting: Neurology

## 2021-09-08 ENCOUNTER — Other Ambulatory Visit: Payer: Self-pay | Admitting: Neurology

## 2021-09-08 DIAGNOSIS — G20 Parkinson's disease: Secondary | ICD-10-CM

## 2021-09-10 ENCOUNTER — Other Ambulatory Visit: Payer: Self-pay

## 2021-09-10 DIAGNOSIS — G20 Parkinson's disease: Secondary | ICD-10-CM

## 2021-09-10 MED ORDER — CARBIDOPA-LEVODOPA 25-100 MG PO TABS: ORAL_TABLET | ORAL | 0 refills | Status: DC

## 2021-10-26 IMAGING — DX DG RIBS 2V*R*
4 series · 4 of 4 positions shown · non-contrast
Comparison: Chest radiograph June 27, 2004

CLINICAL DATA: Pain

EXAM:
RIGHT RIBS - 2 VIEW

[rib ap]
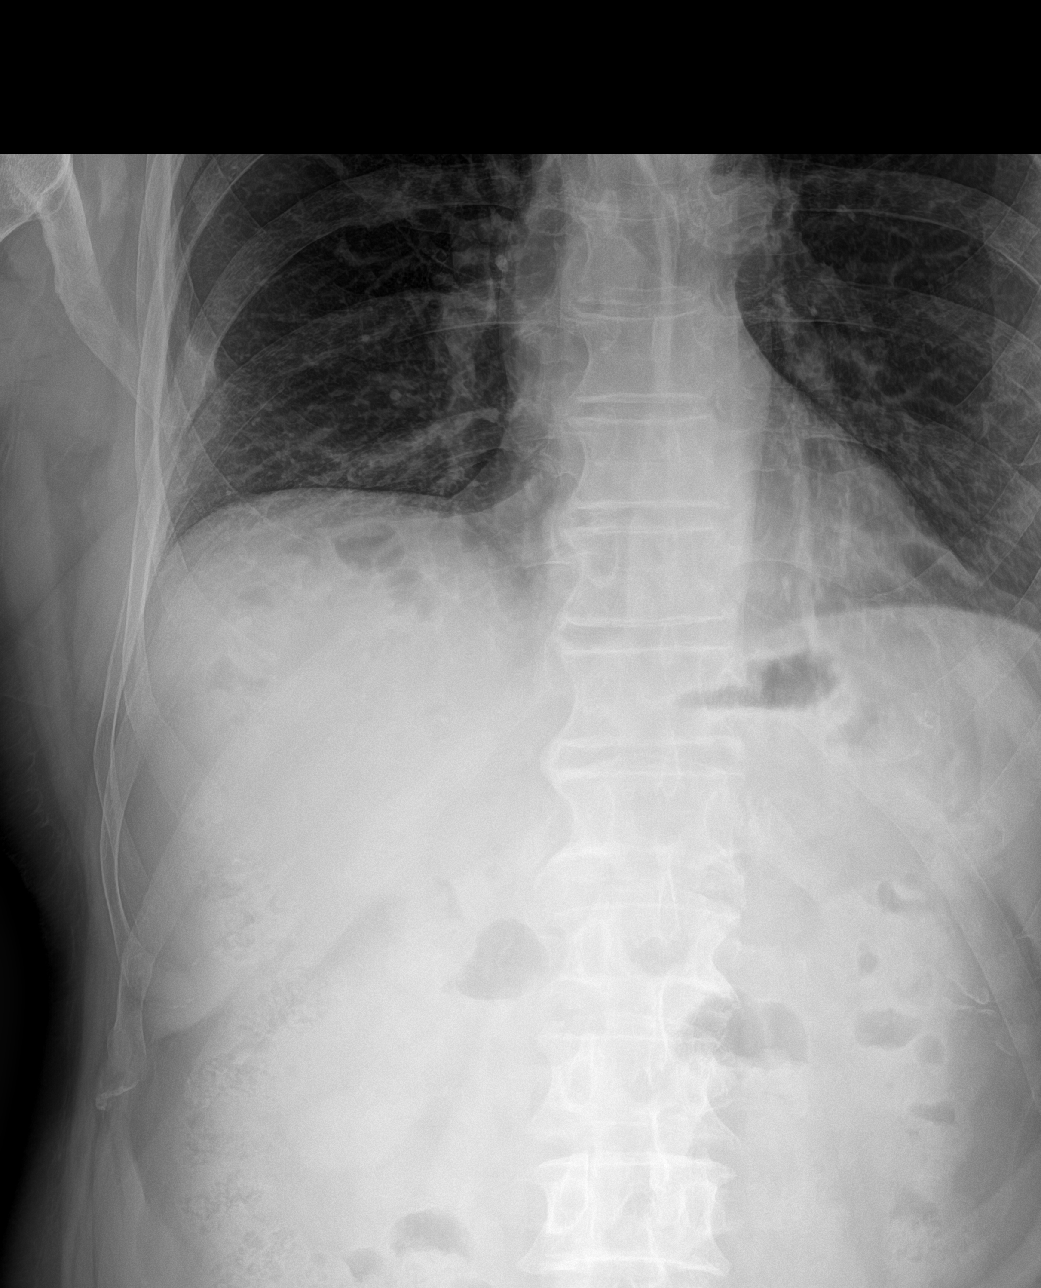

[rib obl (1 of 2)]
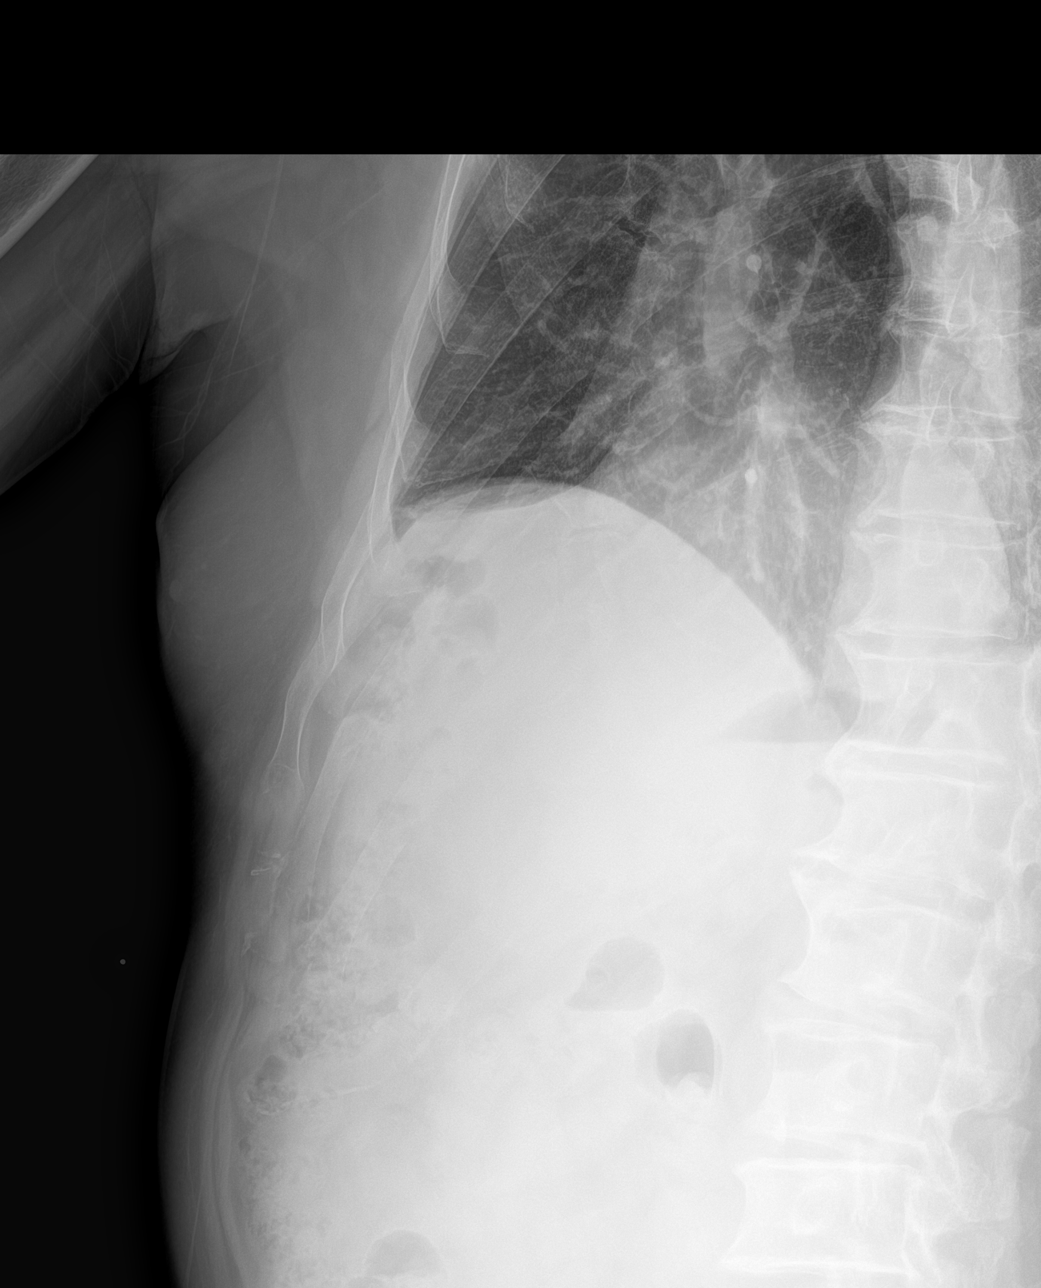

[rib pa]
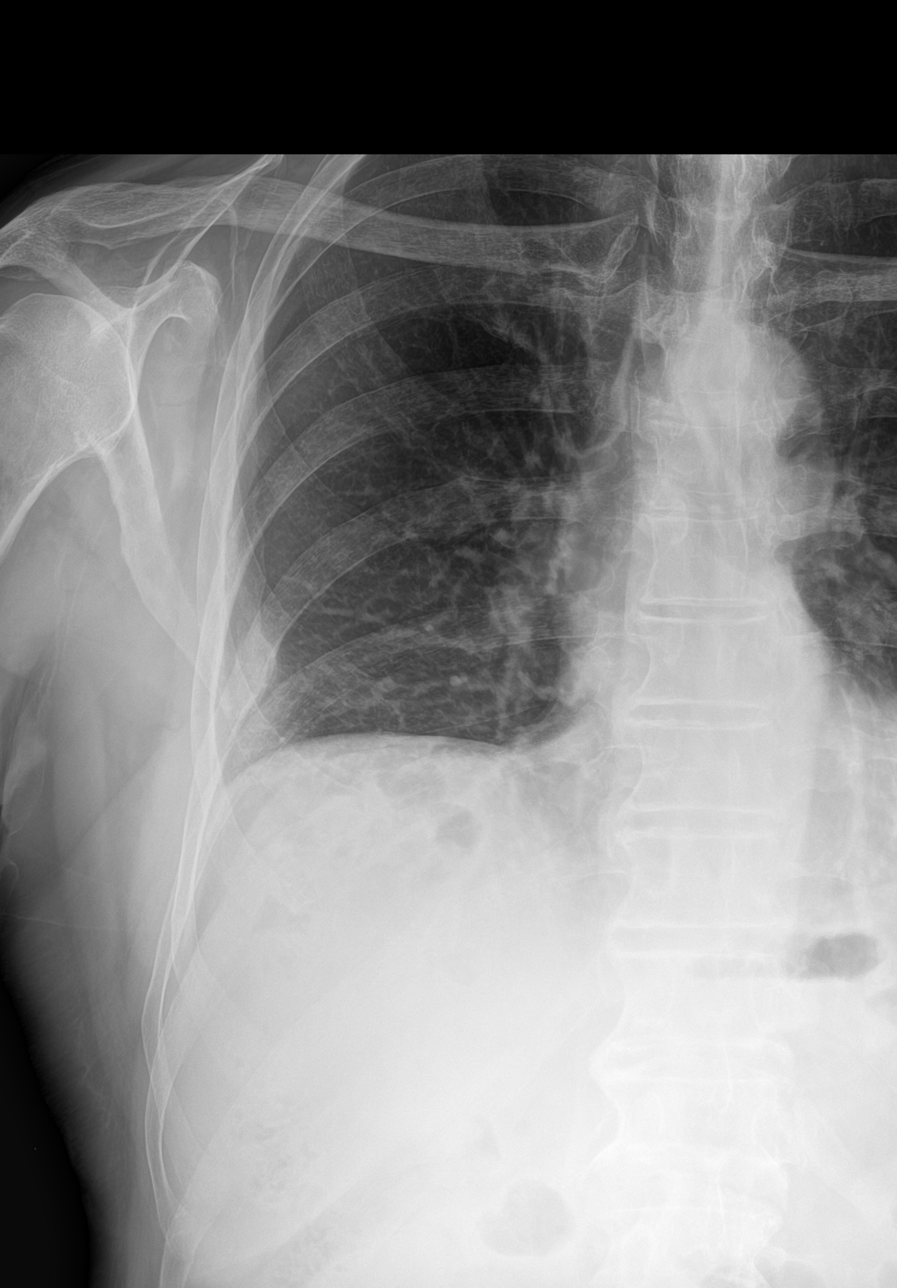

[rib obl (2 of 2)]
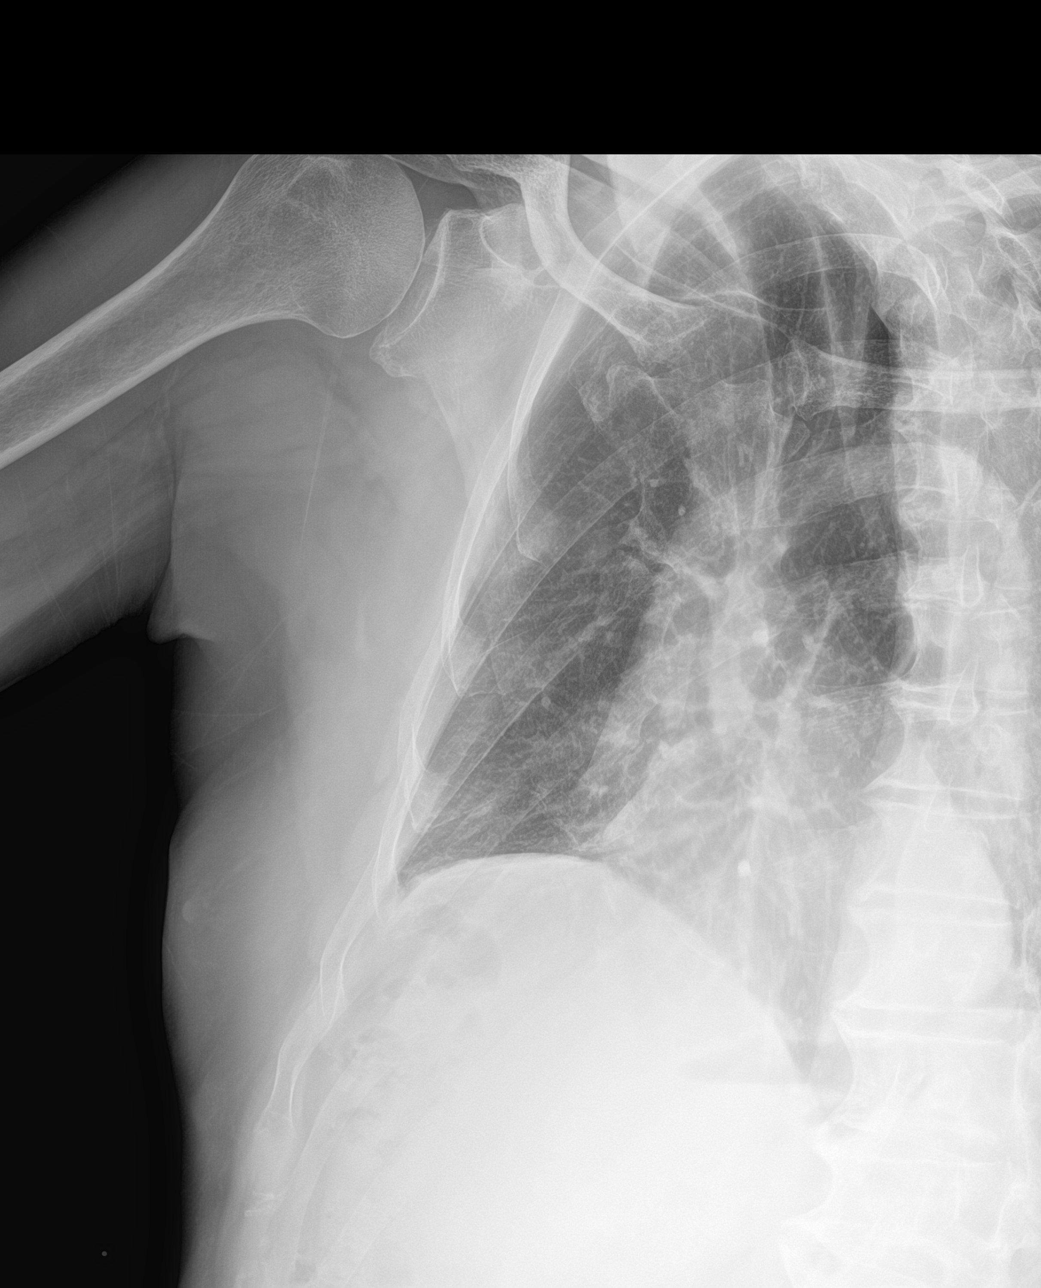

[4 of 4 positions shown; findings below may reference images not displayed]

FINDINGS: Frontal and oblique views obtained. Healing fracture of the
posterolateral right seventh rib noted. No appreciable displaced rib
fracture. Right lung is clear. No right-sided pneumothorax or
pleural effusion. Heart size is normal. No adenopathy appreciable in
visualized regions.
IMPRESSION: Healing fracture posterolateral right seventh rib in essentially
anatomic alignment. No other rib fracture evident. No pneumothorax
or pleural effusion. Right lung clear.

These results will be called to the ordering clinician or
representative by the Radiologist Assistant, and communication
documented in the PACS or [REDACTED].

## 2021-11-22 NOTE — Progress Notes (Signed)
Assessment/Plan:   1.  Parkinsons Disease  -Continue carbidopa/levodopa 25/100, 2/2/1  -Follows regularly with dermatology.  -discussed PT for Parkinsons Disease.  I think he would benefit but he declined.  He will let me know if changes his mind.  2.  REM behavior disorder  -Patient does not want any medication.  Understands bedroom safety.  Seems to be doing fairly well in that regard. Subjective:   Bryce Wood was seen today in follow up for Parkinsons disease.  My previous records were reviewed prior to todays visit as well as outside records available to me.  Patient was underdosed last visit, but he did not want to add more medication.  Since our last visit, he denies falls.  He thinks he has been doing fairly well.  L hand has more tremor.  He is exercising and playing golf.  Rare cramping of the feet/legs at night.  There has been rare lightheadedness but no near syncope.   Current prescribed movement disorder medications: Carbidopa/levodopa 25/100, 2/2/1    ALLERGIES:  No Known Allergies  CURRENT MEDICATIONS:  Outpatient Encounter Medications as of 11/27/2021  Medication Sig   acetaminophen (TYLENOL) 650 MG CR tablet Take 650 mg by mouth 3 (three) times daily.   carbidopa-levodopa (SINEMET IR) 25-100 MG tablet TAKE 2 TABLETS BY MOUTH AT 6:30AM, 2 TABS AT 10:30AM AND 1 TAB AT 3:30PM   carbidopa-levodopa (SINEMET IR) 25-100 MG tablet TAKE 2 TABLETS BY MOUTH AT 6:30AM, 2 TABS AT 10:30AM AND 1 TAB AT 3:30PM   cholecalciferol (VITAMIN D) 1000 UNITS tablet Take 2,000 Units by mouth daily.   fish oil-omega-3 fatty acids 1000 MG capsule Take 1 g by mouth daily.    lisinopril (ZESTRIL) 20 MG tablet Take 1 tablet (20 mg total) by mouth daily.   TURMERIC PO Take 1 tablet by mouth in the morning and at bedtime.    No facility-administered encounter medications on file as of 11/27/2021.    Objective:   PHYSICAL EXAMINATION:    VITALS:   Vitals:   11/27/21 0811  BP: (!)  148/72  Pulse: 60  SpO2: 97%  Weight: 183 lb (83 kg)  Height: '6\' 1"'$  (1.854 m)    GEN:  The patient appears stated age and is in NAD. HEENT:  Normocephalic, atraumatic.  The mucous membranes are moist. The superficial temporal arteries are without ropiness or tenderness. CV:  RRR Lungs:  CTAB Neck/HEME:  There are no carotid bruits bilaterally.  Neurological examination:  Orientation: The patient is alert and oriented x3. Cranial nerves: There is good facial symmetry with mild facial hypomimia. The speech is fluent and clear. Soft palate rises symmetrically and there is no tongue deviation. Hearing is intact to conversational tone. Sensation: Sensation is intact to light touch throughout Motor: Strength is at least antigravity x4.  Movement examination: Tone: There is mild increased tone in the RUE/RLE Abnormal movements: There is right upper extremity rest tremor (same as previous) Coordination:  There is min decremation on the R Gait and Station: The patient has no difficulty arising out of a deep-seated chair without the use of the hands. The patient is wide based and flexed at knees. Mildly shuffle and drags R leg a little.  He is mildly unstable.  This is all same as previous  I have reviewed and interpreted the following labs independently    Chemistry      Component Value Date/Time   NA 139 01/03/2021 0737   K 4.1 01/03/2021 0737  CL 103 01/03/2021 0737   CO2 28 01/03/2021 0737   BUN 17 01/03/2021 0737   CREATININE 1.08 01/03/2021 0737      Component Value Date/Time   CALCIUM 9.9 01/03/2021 0737   ALKPHOS 51 01/03/2021 0737   AST 19 01/03/2021 0737   ALT 3 01/03/2021 0737   BILITOT 0.7 01/03/2021 0737       Lab Results  Component Value Date   WBC 4.7 01/03/2021   HGB 13.9 01/03/2021   HCT 42.2 01/03/2021   MCV 93.8 01/03/2021   PLT 251.0 01/03/2021    Lab Results  Component Value Date   TSH 1.66 01/03/2021      Cc:  Eulas Post, MD

## 2021-11-27 ENCOUNTER — Encounter: Payer: Self-pay | Admitting: Neurology

## 2021-11-27 ENCOUNTER — Ambulatory Visit: Payer: PPO | Admitting: Neurology

## 2021-11-27 VITALS — BP 148/72 | HR 60 | Ht 73.0 in | Wt 183.0 lb

## 2021-11-27 DIAGNOSIS — G20A1 Parkinson's disease without dyskinesia, without mention of fluctuations: Secondary | ICD-10-CM | POA: Diagnosis not present

## 2021-11-27 MED ORDER — CARBIDOPA-LEVODOPA 25-100 MG PO TABS: ORAL_TABLET | ORAL | 1 refills | Status: DC

## 2021-12-11 DIAGNOSIS — Z08 Encounter for follow-up examination after completed treatment for malignant neoplasm: Secondary | ICD-10-CM | POA: Diagnosis not present

## 2021-12-11 DIAGNOSIS — Z85828 Personal history of other malignant neoplasm of skin: Secondary | ICD-10-CM | POA: Diagnosis not present

## 2021-12-11 DIAGNOSIS — L570 Actinic keratosis: Secondary | ICD-10-CM | POA: Diagnosis not present

## 2021-12-11 DIAGNOSIS — S80812A Abrasion, left lower leg, initial encounter: Secondary | ICD-10-CM | POA: Diagnosis not present

## 2022-01-04 ENCOUNTER — Ambulatory Visit (INDEPENDENT_AMBULATORY_CARE_PROVIDER_SITE_OTHER): Payer: PPO | Admitting: Family Medicine

## 2022-01-04 VITALS — BP 130/82 | HR 59 | Temp 97.6°F | Ht 73.0 in | Wt 183.0 lb

## 2022-01-04 DIAGNOSIS — Z0000 Encounter for general adult medical examination without abnormal findings: Secondary | ICD-10-CM

## 2022-01-04 DIAGNOSIS — E785 Hyperlipidemia, unspecified: Secondary | ICD-10-CM | POA: Diagnosis not present

## 2022-01-04 DIAGNOSIS — I10 Essential (primary) hypertension: Secondary | ICD-10-CM | POA: Diagnosis not present

## 2022-01-04 LAB — LIPID PANEL
Cholesterol: 205 mg/dL — ABNORMAL HIGH (ref 0–200)
HDL: 51.7 mg/dL (ref >39.00)
LDL Cholesterol: 128 mg/dL — ABNORMAL HIGH (ref 0–99)
NonHDL: 153.06
Total CHOL/HDL Ratio: 4
Triglycerides: 124 mg/dL (ref 0.0–149.0)
VLDL: 24.8 mg/dL (ref 0.0–40.0)

## 2022-01-04 LAB — BASIC METABOLIC PANEL
BUN: 20 mg/dL (ref 6–23)
CO2: 28 mEq/L (ref 19–32)
Calcium: 9.8 mg/dL (ref 8.4–10.5)
Chloride: 103 mEq/L (ref 96–112)
Creatinine, Ser: 1.08 mg/dL (ref 0.40–1.50)
GFR: 67.31 mL/min (ref >60.00)
Glucose, Bld: 88 mg/dL (ref 70–99)
Potassium: 4.2 mEq/L (ref 3.5–5.1)
Sodium: 140 mEq/L (ref 135–145)

## 2022-01-04 LAB — HEPATIC FUNCTION PANEL
ALT: 4 U/L (ref 0–53)
AST: 18 U/L (ref 0–37)
Albumin: 4.4 g/dL (ref 3.5–5.2)
Alkaline Phosphatase: 54 U/L (ref 39–117)
Bilirubin, Direct: 0.1 mg/dL (ref 0.0–0.3)
Total Bilirubin: 0.5 mg/dL (ref 0.2–1.2)
Total Protein: 7.1 g/dL (ref 6.0–8.3)

## 2022-01-04 MED ORDER — LISINOPRIL 20 MG PO TABS: 20.0000 mg | ORAL_TABLET | Freq: Every day | ORAL | 3 refills | Status: DC

## 2022-01-04 NOTE — Progress Notes (Signed)
Established Patient Office Visit  Subjective   Patient ID: Bryce Wood, male    DOB: 1945-04-19  Age: 76 y.o. MRN: [id number]  Chief Complaint  Patient presents with   Annual Exam    HPI   Bryce Wood is seen for physical exam.  He has history of Parkinson's disease and is on Sinemet.  He has hypertension treated with lisinopril.  Only occasional dizziness.  No consistent orthostatic symptoms.  He has osteoarthritis involving multiple joints but especially the hands.  He takes Tylenol regularly for that.  Still working at [employer] part-time a few days per week and usually golfs at least 1 day a week.  Health maintenance reviewed and up-to-date with exception of no shingles vaccine.  He declines.  Is already had flu vaccine.  Did Cologuard last year.  Family history-reviewed with no significant changes.  His father had leukemia and died of stroke complications.  He has a brother who has hypertension history.  Does not stay in touch much with him.  His mother apparently died of heart disease complications around age 83.   Social history-he is basically retired but works 1 day/week at [employer].  No history of smoking.  No regular alcohol.  He has 1 daughter and no grandchildren.  Patient is married.  Past Medical History:  Diagnosis Date   ARTHRITIS 08/01/2008   Cancer ([place]) 01/2019   basal cell removed from face   HYPERLIPIDEMIA 11/21/2008   HYPERTENSION 08/01/2008   OSTEOARTHRITIS 08/01/2008   Parkinson's disease    Past Surgical History:  Procedure Laterality Date   CIRCUMCISION N/A 04/22/2019   Procedure: CIRCUMCISION ADULT;  Surgeon: [name], MD;  Location: [facility];  Service: Urology;  Laterality: N/A;   circumcison     HERNIA REPAIR  1979   inguinal   SPINE SURGERY  1999   disk l4 to l5    reports that he has never smoked. He has never used smokeless tobacco. He reports that he does not drink alcohol and does not use drugs. family history includes  Cancer (age of onset: 45) in his paternal uncle; Healthy in his daughter; Heart attack in his mother; Heart disease in his mother; Hypertension in his brother and father; Leukemia in his father; Stroke in his father. No Known Allergies  Review of Systems  Constitutional:  Negative for chills, fever, malaise/fatigue and weight loss.  HENT:  Negative for hearing loss.   Eyes:  Negative for blurred vision and double vision.  Respiratory:  Negative for cough and shortness of breath.   Cardiovascular:  Negative for chest pain, palpitations and leg swelling.  Gastrointestinal:  Negative for abdominal pain, blood in stool, constipation and diarrhea.  Genitourinary:  Negative for dysuria.  Musculoskeletal:  Positive for joint pain.  Skin:  Negative for rash.  Neurological:  Positive for tremors. Negative for dizziness, speech change, seizures, loss of consciousness and headaches.  Psychiatric/Behavioral:  Negative for depression.       Objective:     BP 130/82   Pulse (!) 59   Temp 97.6 F (36.4 C) (Oral)   Ht '6\' 1"'$  (1.854 m)   Wt 183 lb (83 kg)   SpO2 100%   BMI 24.14 kg/m  BP Readings from Last 3 Encounters:  01/04/22 130/82  11/27/21 (!) 148/72  07/09/21 (!) 142/80   Wt Readings from Last 3 Encounters:  01/04/22 183 lb (83 kg)  11/27/21 183 lb (83 kg)  07/09/21 182 lb (82.6 kg)  Physical Exam Vitals reviewed.  Constitutional:      Appearance: He is well-developed.  HENT:     Right Ear: Tympanic membrane normal.     Left Ear: Tympanic membrane normal.  Eyes:     Pupils: Pupils are equal, round, and reactive to light.  Neck:     Thyroid: No thyromegaly.  Cardiovascular:     Rate and Rhythm: Normal rate and regular rhythm.  Pulmonary:     Effort: Pulmonary effort is normal. No respiratory distress.     Breath sounds: Normal breath sounds. No wheezing or rales.  Abdominal:     Palpations: Abdomen is soft. There is no mass.     Tenderness: There is no abdominal  tenderness. There is no guarding or rebound.  Musculoskeletal:     Cervical back: Neck supple.     Right lower leg: No edema.     Left lower leg: No edema.  Skin:    Comments: Scaly eczematous changes external canals of both ears  Neurological:     Mental Status: He is alert and oriented to person, place, and time.     Comments: He has classic tremor associated with his Parkinson's disease.      No results found for any visits on 01/04/22.    The 10-year ASCVD risk score (Arnett DK, et al., 2019) is: 28.1%    Assessment & Plan:   Physical exam.  Patient has chronic problems including Parkinson's disease and hypertension.  Blood pressure well-controlled.  No recent consistent orthostatic symptoms.  -Refill lisinopril 20 mg once daily.  His blood pressure is well-controlled on current dosage -Continue close follow-up with neurology -Check labs including basic metabolic panel, lipid panel, and hepatic panel.  He does have history of hyperlipidemia -We discussed Shingrix vaccine and he declines. -Continue regular exercise to help maintain flexibility and mobility   No follow-ups on file.    Carolann Littler, MD

## 2022-01-08 ENCOUNTER — Ambulatory Visit: Payer: PPO

## 2022-02-13 ENCOUNTER — Ambulatory Visit: Payer: Self-pay

## 2022-02-13 ENCOUNTER — Ambulatory Visit: Payer: PPO | Admitting: Family Medicine

## 2022-02-13 ENCOUNTER — Encounter: Payer: Self-pay | Admitting: Family Medicine

## 2022-02-13 VITALS — BP 116/80 | HR 62 | Ht 73.0 in | Wt 182.0 lb

## 2022-02-13 DIAGNOSIS — M25511 Pain in right shoulder: Secondary | ICD-10-CM

## 2022-02-13 NOTE — Assessment & Plan Note (Signed)
Patient still has some good strength noted.  Some mild hypoechoic changes significant for subacromial bursitis.  We discussed with patient about different treatment options.  Patient would like to continue to work on home exercises, icing regimen, which activities to do and which ones to avoid.  Increase activity slowly over the course the next several weeks.  If worsening pain will consider the possibility injections and formal physical therapy but I do not see anything that makes me concerned that patient will have significant difficulty.  Do feel the underlying Parkinson's can be a difficulty and a social determinant of health which does cause patient to have more fatigue in the musculature.  Follow-up with me again in 2 to 3 months

## 2022-02-13 NOTE — Patient Instructions (Signed)
See you again in 2-3 months Enjoy that golf but keep hands in peripheral vision Ice before bed

## 2022-02-13 NOTE — Progress Notes (Signed)
[place] [facility] [place] Phone: [phone] Subjective:    I'm seeing this patient by the request  of:  Eulas Post, MD  CC: Right shoulder pain follow-up  [id number]  12/13/2019 Patient's right upper extremity has a worsening tremor. We will get x-rays to make sure that there is not an occult fracture noted of the ribs. Patient seems to be doing relatively well. Has had his wife so in the hospital and will get a rib series to also look for any type of infectious etiology which I think is unremarkable. Patient will follow up with me again 6 weeks.   Known arthritic changes but is doing very well. Patient does have a history of Parkinson's which does change patient's treatment options. Had been nearly 3 years since we have had difficulty again. Hopefully the patient will make progress. X-rays ordered today to further evaluate the amount and progression of the arthritis. Follow-up with me again 6 week   Updated 02/13/2022 Kassius Battiste is a 77 y.o. male coming in with complaint of R shoulder pain. Pain in the joint and feels it most when sitting around. Still able to golf. Not sure if it has to do with the Parkinson's on that side     Past Medical History:  Diagnosis Date   ARTHRITIS 08/01/2008   Cancer ([place]) 01/2019   basal cell removed from face   HYPERLIPIDEMIA 11/21/2008   HYPERTENSION 08/01/2008   OSTEOARTHRITIS 08/01/2008   Parkinson's disease    Past Surgical History:  Procedure Laterality Date   CIRCUMCISION N/A 04/22/2019   Procedure: CIRCUMCISION ADULT;  Surgeon: [name], MD;  Location: [facility];  Service: Urology;  Laterality: N/A;   circumcison     HERNIA REPAIR  1979   inguinal   SPINE SURGERY  1999   disk l4 to l5   Social History   Socioeconomic History   Marital status: Married    Spouse name: Not on file   Number of children: 1   Years of education: Not on file   Highest  education level: Some college, no degree  Occupational History   Occupation: retired    Comment: Lawyer  Tobacco Use   Smoking status: Never   Smokeless tobacco: Never  Vaping Use   Vaping Use: Never used  Substance and Sexual Activity   Alcohol use: No   Drug use: No   Sexual activity: Not on file  Other Topics Concern   Not on file  Social History Narrative   RIGHT HANDED   [place]   Social Determinants of Health   Financial Resource Strain: Low Risk  (01/01/2021)   Overall Financial Resource Strain (CARDIA)    Difficulty of Paying Living Expenses: Not hard at all  Food Insecurity: No Food Insecurity (01/01/2021)   Hunger Vital Sign    Worried About Running Out of Food in the Last Year: Never true    Ran Out of Food in the Last Year: Never true  Transportation Needs: No Transportation Needs (01/01/2021)   PRAPARE - Hydrologist (Medical): No    Lack of Transportation (Non-Medical): No  Physical Activity: Sufficiently Active (01/01/2021)   Exercise Vital Sign    Days of Exercise per Week: 5 days    Minutes of Exercise per Session: 150+ min  Stress: No Stress Concern Present (01/01/2021)   [place] -  Occupational Stress Questionnaire    Feeling of Stress : Not at all  Social Connections: Socially Integrated (12/21/2019)   Social Connection and Isolation Panel [NHANES]    Frequency of Communication with Friends and Family: More than three times a week    Frequency of Social Gatherings with Friends and Family: More than three times a week    Attends Religious Services: More than 4 times per year    Active Member of [employer] or Organizations: Yes    Attends Music therapist: More than 4 times per year    Marital Status: Married   No Known Allergies Family History  Problem Relation Age of Onset   Heart disease Mother    Heart attack Mother    Hypertension Father     Stroke Father    Leukemia Father    Healthy Daughter    Cancer Paternal Uncle 76       prostate cancer   Hypertension Brother      Current Outpatient Medications (Cardiovascular):    lisinopril (ZESTRIL) 20 MG tablet, Take 1 tablet (20 mg total) by mouth daily.   Current Outpatient Medications (Analgesics):    acetaminophen (TYLENOL) 650 MG CR tablet, Take 650 mg by mouth 3 (three) times daily.   Current Outpatient Medications (Other):    carbidopa-levodopa (SINEMET IR) 25-100 MG tablet, 2 at 7am/2 at 11am, 1 at 4pm   cholecalciferol (VITAMIN D) 1000 UNITS tablet, Take 2,000 Units by mouth daily.   fish oil-omega-3 fatty acids 1000 MG capsule, Take 1 g by mouth daily.    TURMERIC PO, Take 1 tablet by mouth in the morning and at bedtime.    Reviewed prior external information including notes and imaging from  primary care provider As well as notes that were available from care everywhere and other healthcare systems.  Past medical history, social, surgical and family history all reviewed in electronic medical record.  No pertanent information unless stated regarding to the chief complaint.   Review of Systems:  No headache, visual changes, nausea, vomiting, diarrhea, constipation, dizziness, abdominal pain, skin rash, fevers, chills, night sweats, weight loss, swollen lymph nodes, body aches, joint swelling, chest pain, shortness of breath, mood changes. POSITIVE muscle aches  Objective  Blood pressure 116/80, pulse 62, height '6\' 1"'$  (1.854 m), weight 182 lb (82.6 kg), SpO2 95 %.   General: No apparent distress alert and oriented x3 mood and affect normal, dressed appropriately.  Mild masked facies noted. HEENT: Pupils equal, extraocular movements intact  Respiratory: Patient's speak in full sentences and does not appear short of breath  Cardiovascular: No lower extremity edema, non tender, no erythema  Significant right upper extremity tremor noted.  Patient does have some  mild hypertonicity noted.  Very mild cogwheeling noted as well. Mild positive impingement noted.  Rotator cuff strength though does appear to be intact.  Limited muscular skeletal ultrasound was performed and interpreted by Hulan Saas, M  Limited ultrasound shows the patient does have some increase in diameter of the supraspinatus that is consistent with more of a severe tendinitis.  No true tearing appreciated though.  No significant cortical irregularity but does have some mild arthritic changes noted of the glenohumeral and the acromioclavicular joint.  Patient does have hypoechoic changes consistent with a subacromial bursitis. Impression: Subacromial bursitis with tendinosis and mild arthritis.   Impression and Recommendations:     The above documentation has been reviewed and is accurate and complete Lyndal Pulley, DO

## 2022-03-11 ENCOUNTER — Ambulatory Visit: Payer: PPO | Admitting: Family Medicine

## 2022-04-01 ENCOUNTER — Ambulatory Visit (INDEPENDENT_AMBULATORY_CARE_PROVIDER_SITE_OTHER): Payer: PPO | Admitting: Family Medicine

## 2022-04-01 ENCOUNTER — Ambulatory Visit: Payer: PPO | Admitting: Family Medicine

## 2022-04-01 ENCOUNTER — Encounter: Payer: Self-pay | Admitting: Family Medicine

## 2022-04-01 VITALS — BP 140/68 | HR 70 | Temp 97.8°F | Ht 73.0 in | Wt 185.0 lb

## 2022-04-01 DIAGNOSIS — M545 Low back pain, unspecified: Secondary | ICD-10-CM

## 2022-04-01 MED ORDER — NAPROXEN 500 MG PO TABS: 500.0000 mg | ORAL_TABLET | Freq: Two times a day (BID) | ORAL | 0 refills | Status: DC

## 2022-04-01 NOTE — Progress Notes (Signed)
Established Patient Office Visit  Subjective   Patient ID: Bryce Wood, male    DOB: [birth year]  Age: 77 y.o. MRN: [id number]  Chief Complaint  Patient presents with   Back Pain    Patient complains of back pain, x1.5 weeks, Tried Tylenol with little relief    HPI   Bryce Wood is seen with left lower back pain for about a week and a half.  He has remote history of L4-5 disc surgery around age 76.  He had some intermittent back pain through the years.  He recalls having what sounds like an epidural about 7 years ago.  His current pain is left lower back region without radiculitis features.  Worse with bending over.  He tried heat and ice without much benefit.  Also tried some Tiger balm without improvement.  He has some chronic right lower extremity numbness following surgery years ago but no left lower extremity numbness.  No weakness.  Has taken over-the-counter Tylenol without much improvement.  Has benefited some from nonsteroidals in the past.  Past Medical History:  Diagnosis Date   ARTHRITIS 08/01/2008   Cancer ([place]) 01/2019   basal cell removed from face   HYPERLIPIDEMIA 11/21/2008   HYPERTENSION 08/01/2008   OSTEOARTHRITIS 08/01/2008   Parkinson's disease    Past Surgical History:  Procedure Laterality Date   CIRCUMCISION N/A 04/22/2019   Procedure: CIRCUMCISION ADULT;  Surgeon: [name], MD;  Location: [facility];  Service: Urology;  Laterality: N/A;   circumcison     HERNIA REPAIR  1979   inguinal   SPINE SURGERY  1999   disk l4 to l5    reports that he has never smoked. He has never used smokeless tobacco. He reports that he does not drink alcohol and does not use drugs. family history includes Cancer (age of onset: 62) in his paternal uncle; Healthy in his daughter; Heart attack in his mother; Heart disease in his mother; Hypertension in his brother and father; Leukemia in his father; Stroke in his father. No Known Allergies  Review of Systems   Constitutional:  Negative for chills and fever.  Musculoskeletal:  Positive for back pain.  Neurological:  Negative for sensory change and weakness.      Objective:     BP (!) 140/68 (BP Location: Left Arm, Cuff Size: Normal)   Pulse 70   Temp 97.8 F (36.6 C) (Oral)   Ht 6\' 1"  (1.854 m)   Wt 185 lb (83.9 kg)   SpO2 99%   BMI 24.41 kg/m  BP Readings from Last 3 Encounters:  04/01/22 (!) 140/68  02/13/22 116/80  01/04/22 130/82   Wt Readings from Last 3 Encounters:  04/01/22 185 lb (83.9 kg)  02/13/22 182 lb (82.6 kg)  01/04/22 183 lb (83 kg)      Physical Exam Vitals reviewed.  Constitutional:      Appearance: Normal appearance.  Cardiovascular:     Rate and Rhythm: Normal rate and regular rhythm.  Pulmonary:     Effort: Pulmonary effort is normal.     Breath sounds: Normal breath sounds.  Musculoskeletal:     Comments: No spinal tenderness.  No reproducible tenderness in the lower lumbar region.  Straight leg raises are negative.  Neurological:     Mental Status: He is alert.     Comments: He has full strength with plantarflexion and dorsiflexion on the left.  1+ ankle reflex and 2+ knee reflex bilaterally      No  results found for any visits on 04/01/22.    The 10-year ASCVD risk score (Arnett DK, et al., 2019) is: 32.4%    Assessment & Plan:   Acute left lower lumbar back pain.  Suspect musculoskeletal strain versus exacerbation of chronic arthritis. -Continue conservative therapies with heat and/or ice -Avoid active back flexion is much as possible -We did discuss possible trial of physical therapy if not improving over the next couple weeks -Agreed to limited naproxen 500 mg every 12 hours with food for short-term use only -Continue walking and other activities as tolerated   Carolann Littler, MD

## 2022-04-16 DIAGNOSIS — Z08 Encounter for follow-up examination after completed treatment for malignant neoplasm: Secondary | ICD-10-CM | POA: Diagnosis not present

## 2022-04-16 DIAGNOSIS — L821 Other seborrheic keratosis: Secondary | ICD-10-CM | POA: Diagnosis not present

## 2022-04-16 DIAGNOSIS — C4442 Squamous cell carcinoma of skin of scalp and neck: Secondary | ICD-10-CM | POA: Diagnosis not present

## 2022-04-16 DIAGNOSIS — Z85828 Personal history of other malignant neoplasm of skin: Secondary | ICD-10-CM | POA: Diagnosis not present

## 2022-04-16 DIAGNOSIS — L814 Other melanin hyperpigmentation: Secondary | ICD-10-CM | POA: Diagnosis not present

## 2022-04-16 DIAGNOSIS — L570 Actinic keratosis: Secondary | ICD-10-CM | POA: Diagnosis not present

## 2022-04-16 DIAGNOSIS — D225 Melanocytic nevi of trunk: Secondary | ICD-10-CM | POA: Diagnosis not present

## 2022-04-16 DIAGNOSIS — D044 Carcinoma in situ of skin of scalp and neck: Secondary | ICD-10-CM | POA: Diagnosis not present

## 2022-05-06 ENCOUNTER — Ambulatory Visit (INDEPENDENT_AMBULATORY_CARE_PROVIDER_SITE_OTHER): Payer: PPO | Admitting: Family Medicine

## 2022-05-06 ENCOUNTER — Encounter: Payer: Self-pay | Admitting: Family Medicine

## 2022-05-06 VITALS — BP 142/78 | HR 68 | Temp 98.7°F | Ht 73.0 in | Wt 181.6 lb

## 2022-05-06 DIAGNOSIS — G20A1 Parkinson's disease without dyskinesia, without mention of fluctuations: Secondary | ICD-10-CM | POA: Diagnosis not present

## 2022-05-06 DIAGNOSIS — R42 Dizziness and giddiness: Secondary | ICD-10-CM

## 2022-05-06 DIAGNOSIS — I10 Essential (primary) hypertension: Secondary | ICD-10-CM | POA: Diagnosis not present

## 2022-05-06 NOTE — Progress Notes (Signed)
Established Patient Office Visit   Subjective  Patient ID: Bryce Wood, male    DOB: 03-Feb-1945  Age: 77 y.o. MRN: [id number]  Chief Complaint  Patient presents with   Lightheadedness    Pt reports lightheadedness first notice 6 months ago when sitting down during choir. Second episode was yesterday, sitting during church service. With flushed. Pale, was sweating.     Patient is a 77 year old male with pmh sig for HTN, Parkinson's disease, arthritis, HLD who is followed by Dr. Caryl Never and seen for acute concern.  Patient endorses feeling lightheaded yesterday while at church.  Patient states he was sitting on the pew and was told by his wife that he started mumbling appeared flush/pale ~11am.  Patient states he remembers a nurse checking his blood pressure which was 140/80 and sweating profusely.  Patient states he did not sleep well the night before.  Endorses eating breakfast around 630 am- grape juice, toast with honey, raisin bran cereal.  Patient notes he did not have any water that morning and had only a amount of the juice.  Patient states a similar episode happened 6 months ago while he was sitting in the choir stand.  At that time he was able to get up, get some water, and use the restroom when symptoms resolved.  Patient expresses concern about symptoms as he is planning to go to [facility] with his wife for their 40th anniversary.  Patient denies headache, chest pain, palpitations, vertigo at time of symptoms.        ROS Negative unless stated above    Objective:     BP (!) 142/78 (BP Location: Left Arm, Patient Position: Sitting, Cuff Size: Large)   Pulse 68   Temp 98.7 F (37.1 C) (Oral)   Ht  (1.854 m)   Wt 181 lb 9.6 oz (82.4 kg)   SpO2 97%   BMI 23.96 kg/m    Physical Exam Constitutional:      General: He is not in acute distress.    Appearance: Normal appearance.  HENT:     Head: Normocephalic and atraumatic.     Nose: Nose normal.      Mouth/Throat:     Mouth: Mucous membranes are moist.  Eyes:     Extraocular Movements: Extraocular movements intact.     Conjunctiva/sclera: Conjunctivae normal.     Pupils: Pupils are equal, round, and reactive to light.  Cardiovascular:     Rate and Rhythm: Normal rate and regular rhythm.     Heart sounds: Normal heart sounds. No murmur heard.    No gallop.  Pulmonary:     Effort: Pulmonary effort is normal. No respiratory distress.     Breath sounds: Normal breath sounds. No wheezing, rhonchi or rales.  Skin:    General: Skin is warm and dry.  Neurological:     Mental Status: He is alert and oriented to person, place, and time.     Motor: Tremor and abnormal muscle tone present.      No results found for any visits on 05/06/22.    Assessment & Plan:  Intermittent lightheadedness  Parkinson's disease without dyskinesia or fluctuating manifestations -Continue carbidopa levodopa -Continue follow-up with neurology, Dr. Sylvie Farrier  Essential hypertension -Controlled -Continue current medication including lisinopril 20 mg daily.  Episodes of lightheadedness likely 2/2 hypoglycemia is ate a light breakfast around 6:30 AM, then had episode around 11/11:30 AM.  Patient advised to keep snacks such as a granola bar on hand.  Also discussed dehydration may have contributed to symptoms.  Medication less likely the cause as patient has been on meds consistently for years.  Symptoms less likely associated with orthostatic hypotension as BP was normal at time of incident.  Return if symptoms worsen or fail to improve.   Deeann Saint, MD

## 2022-05-30 ENCOUNTER — Ambulatory Visit: Payer: PPO | Admitting: Neurology

## 2022-05-30 NOTE — Progress Notes (Signed)
Assessment/Plan:   1.  Parkinsons Disease  -Continue carbidopa/levodopa 25/100, 2/2/1  -Follows regularly with dermatology.  -discussed PT for Parkinsons Disease.  I think he would benefit but he declined.  He will let me know if changes his mind.  2.  REM behavior disorder  -Patient does not want any medication.  Understands bedroom safety.  Seems to be doing fairly well in that regard.  3.  Intermittent lightheadedness  -Following with primary care.  It was felt that this may be due to intermittent hypoglycemia.  -Blood pressure was elevated at primary care visit.  He is on lisinopril.  4.  Syncopal episode  -occurred in church while seated.  I would be concerned about arrhythmia.  He felt due to dehydration but I am not so convinced that was issue as he was seated and diaphoretic at time of event.  Note sent to pcp for possible f/u.   Subjective:   Bryce Wood was seen today in follow up for Parkinsons disease.  My previous records were reviewed prior to todays visit as well as outside records available to me.  Patient remains on levodopa and feels he is doing fairly well/stable.  He has a little more tremor on the L hand.  No falls.  He did recently see primary care on April 22 and was complaining about intermittent lightheadedness.  His blood pressure at that visit was on the high and then they decided to continue his lisinopril.  They felt that his lightheadedness may be due to hypoglycemia. Pt states that about 6 months ago at church, he was singing and got lightheaded.  He left and got water and he felt better.  Then a few weeks ago, he was at church but he was SEATED and got lightheaded and then "the next thing I know someone was standing over me."  He passed out.  He was awake in a few seconds.  A nurse was present and took his BP and it was good but "I was in a cold sweat."  No loss of bladder/bowel control.  That was when he went to the PCP and he has been staying well hydrated  and feels better.  He states his urine was dark.     Current prescribed movement disorder medications: Carbidopa/levodopa 25/100, 2/2/1    ALLERGIES:  No Known Allergies  CURRENT MEDICATIONS:  Outpatient Encounter Medications as of 06/03/2022  Medication Sig   acetaminophen (TYLENOL) 650 MG CR tablet Take 650 mg by mouth 3 (three) times daily.   carbidopa-levodopa (SINEMET IR) 25-100 MG tablet 2 at 7am/2 at 11am, 1 at 4pm   cholecalciferol (VITAMIN D) 1000 UNITS tablet Take 2,000 Units by mouth daily.   fish oil-omega-3 fatty acids 1000 MG capsule Take 1 g by mouth daily.    lisinopril (ZESTRIL) 20 MG tablet Take 1 tablet (20 mg total) by mouth daily.   TURMERIC PO Take 1 tablet by mouth in the morning and at bedtime.    No facility-administered encounter medications on file as of 06/03/2022.    Objective:   PHYSICAL EXAMINATION:    VITALS:   Vitals:   06/03/22 0832  BP: 128/70  Pulse: 74  SpO2: 97%  Weight: 182 lb (82.6 kg)  Height: 6\' 1"  (1.854 m)     GEN:  The patient appears stated age and is in NAD. HEENT:  Normocephalic, atraumatic.  The mucous membranes are moist. The superficial temporal arteries are without ropiness or tenderness. CV:  RRR Lungs:  CTAB Neck/HEME:  There are no carotid bruits bilaterally.  Neurological examination:  Orientation: The patient is alert and oriented x3. Cranial nerves: There is good facial symmetry with mild facial hypomimia. The speech is fluent and clear. Soft palate rises symmetrically and there is no tongue deviation. Hearing is intact to conversational tone. Sensation: Sensation is intact to light touch throughout Motor: Strength is at least antigravity x4.  Movement examination: Tone: There is mild increased tone in the RUE/RLE Abnormal movements: There is R>LUE rest tremor Coordination:  There is mild decremation with hand opening and closing on the L and finger taps on the R.  All other RAMs are nl Gait and Station:  The patient has no difficulty arising out of a deep-seated chair without the use of the hands. The patient is wide based and flexed at knees. Mildly shuffle and drags R leg a little.  He is mildly unstable.  This is all same as previous  I have reviewed and interpreted the following labs independently    Chemistry      Component Value Date/Time   NA 140 01/04/2022 0739   K 4.2 01/04/2022 0739   CL 103 01/04/2022 0739   CO2 28 01/04/2022 0739   BUN 20 01/04/2022 0739   CREATININE 1.08 01/04/2022 0739      Component Value Date/Time   CALCIUM 9.8 01/04/2022 0739   ALKPHOS 54 01/04/2022 0739   AST 18 01/04/2022 0739   ALT 4 01/04/2022 0739   BILITOT 0.5 01/04/2022 0739       Lab Results  Component Value Date   WBC 4.7 01/03/2021   HGB 13.9 01/03/2021   HCT 42.2 01/03/2021   MCV 93.8 01/03/2021   PLT 251.0 01/03/2021    Lab Results  Component Value Date   TSH 1.66 01/03/2021   Total time spent on today's visit was 20 minutes, including both face-to-face time and nonface-to-face time.  Time included that spent on review of records (prior notes available to me/labs/imaging if pertinent), discussing treatment and goals, answering patient's questions and coordinating care.    Cc:  Kristian Covey, MD

## 2022-06-03 ENCOUNTER — Encounter: Payer: Self-pay | Admitting: Neurology

## 2022-06-03 ENCOUNTER — Ambulatory Visit: Payer: PPO | Admitting: Neurology

## 2022-06-03 VITALS — BP 128/70 | HR 74 | Ht 73.0 in | Wt 182.0 lb

## 2022-06-03 DIAGNOSIS — R55 Syncope and collapse: Secondary | ICD-10-CM

## 2022-06-03 DIAGNOSIS — G20A1 Parkinson's disease without dyskinesia, without mention of fluctuations: Secondary | ICD-10-CM | POA: Diagnosis not present

## 2022-06-03 NOTE — Patient Instructions (Signed)
No changes today.    The physicians and staff at [facility] are committed to providing excellent care. You may receive a survey requesting feedback about your experience at our office. We strive to receive "very good" responses to the survey questions. If you feel that your experience would prevent you from giving the office a "very good " response, please contact our office to try to remedy the situation. We may be reached at [phone]. Thank you for taking the time out of your busy day to complete the survey.

## 2022-06-29 ENCOUNTER — Other Ambulatory Visit: Payer: Self-pay | Admitting: Neurology

## 2022-06-29 DIAGNOSIS — G20A1 Parkinson's disease without dyskinesia, without mention of fluctuations: Secondary | ICD-10-CM

## 2022-07-16 DIAGNOSIS — L570 Actinic keratosis: Secondary | ICD-10-CM | POA: Diagnosis not present

## 2022-11-24 ENCOUNTER — Other Ambulatory Visit: Payer: Self-pay | Admitting: Neurology

## 2022-11-24 DIAGNOSIS — G20A1 Parkinson's disease without dyskinesia, without mention of fluctuations: Secondary | ICD-10-CM

## 2022-12-04 NOTE — Progress Notes (Signed)
Assessment/Plan:   1.  Parkinsons Disease  -Continue carbidopa/levodopa 25/100, 2/2/1  -Follows regularly with dermatology.  -PT/OT referral  2.  REM behavior disorder  -Patient does not want any medication.  Understands bedroom safety.  Seems to be doing fairly well in that regard.  3.  Intermittent lightheadedness  -Following with primary care.  It was felt that this may be due to intermittent hypoglycemia.  -Blood pressure was elevated at primary care visit.  He is on lisinopril.  4.  Syncopal episode  -occurred in church while seated.  Patient thought due to dehydration.  I am not convinced given that he was seated.  I am concerned about arrhythmia.  I did send primary care message.    5.  Insomnia  -doesn't want medication right now Subjective:   Bryce Wood was seen today in follow up for Parkinsons disease.  My previous records were reviewed prior to todays visit as well as outside records available to me.  Patient has had no further syncopal episodes since our last visit.  He has not yet seen primary care.  He has had no falls.  No hallucinations.  He is taking levodopa faithfully.  Unfortunately, his wife fell Friday and broke her wrist.  Not sleeping as well as in the past but not really feeling tired in day.    Current prescribed movement disorder medications: Carbidopa/levodopa 25/100, 2/2/1  ALLERGIES:  No Known Allergies  CURRENT MEDICATIONS:  Outpatient Encounter Medications as of 12/09/2022  Medication Sig   acetaminophen (TYLENOL) 650 MG CR tablet Take 650 mg by mouth 3 (three) times daily.   carbidopa-levodopa (SINEMET IR) 25-100 MG tablet TAKE 2 TABLETS AT 7AM, THEN TAKE 2 TABLETS AT 11AM, THEN TAKE 1 TABLET AT 4PM   cholecalciferol (VITAMIN D) 1000 UNITS tablet Take 2,000 Units by mouth daily.   fish oil-omega-3 fatty acids 1000 MG capsule Take 1 g by mouth daily.    lisinopril (ZESTRIL) 20 MG tablet Take 1 tablet (20 mg total) by mouth daily.    TURMERIC PO Take 1 tablet by mouth in the morning and at bedtime.    No facility-administered encounter medications on file as of 12/09/2022.    Objective:   PHYSICAL EXAMINATION:    VITALS:   Vitals:   12/09/22 0752  BP: 138/70  Pulse: 67  SpO2: 98%  Weight: 186 lb 12.8 oz (84.7 kg)  Height: 6' (1.829 m)    GEN:  The patient appears stated age and is in NAD. HEENT:  Normocephalic, atraumatic.  The mucous membranes are moist. The superficial temporal arteries are without ropiness or tenderness. CV:  RRR Lungs:  CTAB Neck/HEME:  There are no carotid bruits bilaterally.  Neurological examination:  Orientation: The patient is alert and oriented x3. Cranial nerves: There is good facial symmetry with mild facial hypomimia. The speech is fluent and clear. Soft palate rises symmetrically and there is no tongue deviation. Hearing is intact to conversational tone. Sensation: Sensation is intact to light touch throughout Motor: Strength is at least antigravity x4.  Movement examination: Tone: There is mild increased tone in the RUE Abnormal movements: There is R>LUE rest tremor;  there is minimal dyskinesia on the L shoulder Coordination:  There is mild decremation with hand opening and closing on the L and finger taps on the R.  All other RAMs are nl Gait and Station: The patient has no difficulty arising out of a deep-seated chair without the use of the hands. The patient  is wide based and flexed at knees. Mildly shuffle and drags R leg a little.  He is mildly unstable.  This is all same as previous  I have reviewed and interpreted the following labs independently    Chemistry      Component Value Date/Time   NA 140 01/04/2022 0739   K 4.2 01/04/2022 0739   CL 103 01/04/2022 0739   CO2 28 01/04/2022 0739   BUN 20 01/04/2022 0739   CREATININE 1.08 01/04/2022 0739      Component Value Date/Time   CALCIUM 9.8 01/04/2022 0739   ALKPHOS 54 01/04/2022 0739   AST 18 01/04/2022  0739   ALT 4 01/04/2022 0739   BILITOT 0.5 01/04/2022 0739       Lab Results  Component Value Date   WBC 4.7 01/03/2021   HGB 13.9 01/03/2021   HCT 42.2 01/03/2021   MCV 93.8 01/03/2021   PLT 251.0 01/03/2021    Lab Results  Component Value Date   TSH 1.66 01/03/2021   Total time spent on today's visit was 20 minutes, including both face-to-face time and nonface-to-face time.  Time included that spent on review of records (prior notes available to me/labs/imaging if pertinent), discussing treatment and goals, answering patient's questions and coordinating care.    Cc:  Kristian Covey, MD

## 2022-12-09 ENCOUNTER — Ambulatory Visit: Payer: PPO | Admitting: Neurology

## 2022-12-09 ENCOUNTER — Encounter: Payer: Self-pay | Admitting: Neurology

## 2022-12-09 VITALS — BP 138/70 | HR 67 | Ht 72.0 in | Wt 186.8 lb

## 2022-12-09 DIAGNOSIS — G20A1 Parkinson's disease without dyskinesia, without mention of fluctuations: Secondary | ICD-10-CM | POA: Diagnosis not present

## 2022-12-09 DIAGNOSIS — G4709 Other insomnia: Secondary | ICD-10-CM

## 2022-12-09 DIAGNOSIS — G20B1 Parkinson's disease with dyskinesia, without mention of fluctuations: Secondary | ICD-10-CM

## 2022-12-09 MED ORDER — CARBIDOPA-LEVODOPA 25-100 MG PO TABS: ORAL_TABLET | ORAL | 1 refills | Status: DC

## 2022-12-19 ENCOUNTER — Ambulatory Visit: Payer: Self-pay | Admitting: Physical Therapy

## 2022-12-19 ENCOUNTER — Encounter: Payer: Self-pay | Admitting: Occupational Therapy

## 2022-12-24 ENCOUNTER — Other Ambulatory Visit: Payer: Self-pay | Admitting: Family Medicine

## 2023-01-02 ENCOUNTER — Other Ambulatory Visit: Payer: Self-pay

## 2023-01-02 ENCOUNTER — Ambulatory Visit: Payer: PPO

## 2023-01-02 ENCOUNTER — Ambulatory Visit: Payer: PPO | Attending: Neurology | Admitting: Occupational Therapy

## 2023-01-02 DIAGNOSIS — R262 Difficulty in walking, not elsewhere classified: Secondary | ICD-10-CM | POA: Insufficient documentation

## 2023-01-02 DIAGNOSIS — M6281 Muscle weakness (generalized): Secondary | ICD-10-CM | POA: Diagnosis not present

## 2023-01-02 DIAGNOSIS — R2681 Unsteadiness on feet: Secondary | ICD-10-CM

## 2023-01-02 DIAGNOSIS — R29818 Other symptoms and signs involving the nervous system: Secondary | ICD-10-CM | POA: Insufficient documentation

## 2023-01-02 DIAGNOSIS — G20B1 Parkinson's disease with dyskinesia, without mention of fluctuations: Secondary | ICD-10-CM | POA: Insufficient documentation

## 2023-01-02 DIAGNOSIS — R2689 Other abnormalities of gait and mobility: Secondary | ICD-10-CM | POA: Diagnosis not present

## 2023-01-02 DIAGNOSIS — R278 Other lack of coordination: Secondary | ICD-10-CM | POA: Insufficient documentation

## 2023-01-02 NOTE — Therapy (Signed)
OUTPATIENT OCCUPATIONAL THERAPY PARKINSON'S EVALUATION  Patient Name: Bryce Wood MRN: [id number] DOB:10/28/1945, 77 y.o., male Today's Date: 01/02/2023  PCP: Kristian Covey, MD REFERRING PROVIDER: Vladimir Faster, DO  END OF SESSION:  OT End of Session - 01/02/23 1528     Visit Number 1    Number of Visits 8    Date for OT Re-Evaluation 02/28/23   asking 8 weeks to accommodate scheduling   Authorization Type [employer]    OT Start Time 0802    OT Stop Time 0845    OT Time Calculation (min) 43 min             Past Medical History:  Diagnosis Date   ARTHRITIS 08/01/2008   Cancer (HCC) 01/2019   basal cell removed from face   HYPERLIPIDEMIA 11/21/2008   HYPERTENSION 08/01/2008   OSTEOARTHRITIS 08/01/2008   Parkinson's disease (HCC)    Past Surgical History:  Procedure Laterality Date   CIRCUMCISION N/A 04/22/2019   Procedure: CIRCUMCISION ADULT;  Surgeon: Bjorn Pippin, MD;  [facility];  Service: Urology;  Laterality: N/A;   circumcison     HERNIA REPAIR  1979   inguinal   SPINE SURGERY  1999   disk l4 to l5   Patient Active Problem List   Diagnosis Date Noted   Right shoulder pain 02/13/2022   Degenerative arthritis of right knee 06/11/2017   Parkinson's disease (HCC) 10/03/2016   Arthritis of left shoulder region 11/28/2014   Pes planus of both feet 01/04/2014   Localized osteoarthrosis, lower leg 10/11/2013   Chronic meniscal tear of knee 10/11/2013   Hyperlipidemia 11/21/2008   Essential hypertension 08/01/2008   Osteoarthritis 08/01/2008   Arthropathy 08/01/2008    ONSET DATE: Diagnosed in 2017 (referral date 12/09/22)  REFERRING DIAG: G20.B1 (ICD-10-CM) - Parkinson's disease with dyskinesia without fluctuating manifestations  THERAPY DIAG:  Parkinson's disease with dyskinesia without fluctuating manifestations (HCC)  Other symptoms and signs involving the nervous system  Other lack of coordination  Muscle  weakness (generalized)  Rationale for Evaluation and Treatment: Rehabilitation  SUBJECTIVE:   SUBJECTIVE STATEMENT: Pt report "I'm finding that I'm weakening in the strength in arms and wrists".  Pt reports that getting out of bed, getting covers pulled off, has been more challenging.  Pt reports difficulty with use of scissors.  Recognizes impaired pinch strength and control. Pt accompanied by: self  PERTINENT HISTORY: HTN, Parkinson's disease, arthritis, HLD  PRECAUTIONS: Fall  WEIGHT BEARING RESTRICTIONS: No  PAIN:  Are you having pain? No  FALLS: Has patient fallen in last 6 months? 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  HAND DOMINANCE: Right  ADLs: Overall ADLs: Mod I Transfers/ambulation related to ADLs: Mod I Eating: increased spilling when eating cereal in the mornings Grooming: Mod I UB Dressing: Mod I LB Dressing: occasional difficulty and increased time with pulling up certain pants and fastening buttons Toileting: Mod I Bathing: Mod I Tub Shower transfers: Mod I Equipment: Emergency planning/management officer, Grab  bars, and Walk in shower  IADLs: Shopping: Mod I Light housekeeping: Mod I - reports doing the vacuuming and dusting, as well as yard work Meal Prep: does not cook, wife does the cooking Community mobility: still driving Medication management: Mod I Financial management: Mod I Handwriting: 50% legible; PPT #1 ([place] in 19.84 sec, mild-mod micrographia  MOBILITY STATUS: Mildly shuffle and drags R leg a little.  He is mildly unstable.   POSTURE COMMENTS:  No Significant postural limitations  ACTIVITY TOLERANCE: Activity tolerance: WFL for tasks assessed  FUNCTIONAL OUTCOME MEASURES: Fastening/unfastening 3 buttons: 48.22 sec Physical performance test: PPT#2 (simulated eating) 14.66 sec & PPT#4 (donning/doffing jacket): TBD  COORDINATION: 9 Hole Peg test: Right: 38.34 sec; Left: 32.40 sec Box and Blocks:  Right 47 blocks, Left 51 blocks Tremors: Resting, action, and Right  UE ROM:   RUE: 114*, LUE: 127* shoulder flexion, approx -10* elbow extension bilaterally  UE MMT:    Grossly 4/5 bilaterally   Grip: R: 46# and L: 62#; lateral pinch: R: 13#, L: 19#; 3 point pinch: R: 12#, L: 16#  SENSATION: WFL  MUSCLE TONE: RUE: Mild and Hypertonic  COGNITION: Overall cognitive status: Within functional limits for tasks assessed  OBSERVATIONS: Bradykinesia; Resting and action tremor RUE > LUE   TODAY'S TREATMENT:                                                                                                                              DATE:  01/02/23 NA, eval only    PATIENT EDUCATION: Education details: Educated on role and purpose of OT as well as potential interventions and goals for therapy based on initial evaluation findings. Person educated: Patient Education method: Explanation Education comprehension: verbalized understanding and needs further education  HOME EXERCISE PROGRAM: TBD  GOALS: Goals reviewed with patient? Yes  SHORT TERM GOALS: Target date: 01/31/23  Pt will be Independent with PD specific HEP. Baseline: Goal status: INITIAL  2.  Pt will demonstrate improved fine motor coordination for ADLs as evidenced by decreasing 9 hole peg test score for RUE by 5 secs Baseline: Right: 38.34 sec; Left: 32.40 sec Goal status: INITIAL  3.  Pt will verbalize  understanding of ways to prevent future PD related complications and PD community resources. Baseline: interest in exercise recommendations, however decreased interest in group exercise programs Goal status: INITIAL   LONG TERM GOALS: Target date: 02/28/23  Pt will demonstrate improved ease with fastening buttons as evidenced by decreasing 3 button/ unbutton time to <35 seconds. Baseline: 3 buttons: 48.22 sec Goal status: INITIAL  2.  Pt will demonstrate improved grip strength by #5 on RUE to allow for increased ease with LB dressing and opening containers. Baseline: R: 46# and L: 62#; Goal status: INITIAL  3.  Pt will write a 10-15 item grocery list with 75% legibility and no significant decrease in letter size Baseline: 50% legibility, min-mod micrographia Goal  status: INITIAL  4.  Pt will verbalize and/or demonstrate improved ease with getting out of bed, particularly pulling covers on/off. Baseline:  Goal status: INITIAL  ASSESSMENT:  CLINICAL IMPRESSION: Patient is a 77 y.o. male who was seen today for occupational therapy evaluation for Parkinson's disease with dyskinesia, impacting ease with LB dressing and managing clothing fasteners.  Pt reports impaired grip strength and coordination in dominant RUE.  Pt currently lives with spouse in single level home. Pt will benefit from skilled occupational therapy services to address strength and coordination, ROM, pain management, GM/FM control, safety awareness, introduction of compensatory strategies/AE prn,  and implementation of an HEP to improve participation and safety during ADLs, IADLs, and quality of life.    PERFORMANCE DEFICITS: in functional skills including ADLs, IADLs, coordination, tone, ROM, strength, flexibility, Fine motor control, Gross motor control, mobility, body mechanics, endurance, decreased knowledge of precautions, decreased knowledge of use of DME, and UE functional use and psychosocial skills including coping  strategies and routines and behaviors.   IMPAIRMENTS: are limiting patient from ADLs, IADLs, and play.   COMORBIDITIES:  may have co-morbidities  that affects occupational performance. Patient will benefit from skilled OT to address above impairments and improve overall function.  MODIFICATION OR ASSISTANCE TO COMPLETE EVALUATION: No modification of tasks or assist necessary to complete an evaluation.  OT OCCUPATIONAL PROFILE AND HISTORY: Problem focused assessment: Including review of records relating to presenting problem.  CLINICAL DECISION MAKING: LOW - limited treatment options, no task modification necessary  REHAB POTENTIAL: Good  EVALUATION COMPLEXITY: Low    PLAN:  OT FREQUENCY: 1-2x/week  OT DURATION: 6 weeks (asking for 8 to accommodate scheduling)  PLANNED INTERVENTIONS: 65784 OT Re-evaluation, 97535 self care/ADL training, 69629 therapeutic exercise, 97530 therapeutic activity, 97112 neuromuscular re-education, 97140 manual therapy, 97035 ultrasound, 97010 moist heat, 97010 cryotherapy, passive range of motion, functional mobility training, psychosocial skills training, energy conservation, coping strategies training, patient/family education, and DME and/or AE instructions  RECOMMENDED OTHER SERVICES: NA  CONSULTED AND AGREED WITH PLAN OF CARE: Patient  PLAN FOR NEXT SESSION: Initiate large amplitude HEP, coordination and strengthening HEP   Veva Grimley, OTR/L 01/02/2023, 3:46 PM   [facility] [address], [address] [place], [place] [address] Phone # [phone] Fax # [phone]

## 2023-01-02 NOTE — Therapy (Signed)
OUTPATIENT PHYSICAL THERAPY NEURO EVALUATION   Patient Name: Bryce Wood MRN: [id number] DOB:May 08, 1945, 77 y.o., male Today's Date: 01/02/2023   PCP: Kristian Covey, MD REFERRING PROVIDER: Vladimir Faster, DO  END OF SESSION:  PT End of Session - 01/02/23 0840     Visit Number 1    Number of Visits 13    Date for PT Re-Evaluation 02/13/23    Authorization Type [employer]    PT Start Time 0845    PT Stop Time 0930    PT Time Calculation (min) 45 min             Past Medical History:  Diagnosis Date   ARTHRITIS 08/01/2008   Cancer (HCC) 01/2019   basal cell removed from face   HYPERLIPIDEMIA 11/21/2008   HYPERTENSION 08/01/2008   OSTEOARTHRITIS 08/01/2008   Parkinson's disease (HCC)    Past Surgical History:  Procedure Laterality Date   CIRCUMCISION N/A 04/22/2019   Procedure: CIRCUMCISION ADULT;  Surgeon: Bjorn Pippin, MD;  Location: [facility];  Service: Urology;  Laterality: N/A;   circumcison     HERNIA REPAIR  1979   inguinal   SPINE SURGERY  1999   disk l4 to l5   Patient Active Problem List   Diagnosis Date Noted   Right shoulder pain 02/13/2022   Degenerative arthritis of right knee 06/11/2017   Parkinson's disease (HCC) 10/03/2016   Arthritis of left shoulder region 11/28/2014   Pes planus of both feet 01/04/2014   Localized osteoarthrosis, lower leg 10/11/2013   Chronic meniscal tear of knee 10/11/2013   Hyperlipidemia 11/21/2008   Essential hypertension 08/01/2008   Osteoarthritis 08/01/2008   Arthropathy 08/01/2008    ONSET DATE: 2018  REFERRING DIAG: G20.B1 (ICD-10-CM) - Parkinson's disease with dyskinesia without fluctuating manifestations (HCC)  THERAPY DIAG:  Unsteadiness on feet  Other abnormalities of gait and mobility  Difficulty in walking, not elsewhere classified  Rationale for Evaluation and Treatment: Rehabilitation  SUBJECTIVE:                                                                                                                                                                                              SUBJECTIVE STATEMENT: Onset of PD diagnosed in 2018.  Reports hx of ruptured disc 40 years ago with resulting RLE symptoms and notes that this has caused some lasting gait problems.  Denies any shuffling or freezing of gait. Currently for exercise reports walking 2x/wk 30-40 minutes. Plays golf 3x/wk Pt accompanied by: self  PERTINENT HISTORY:  From Dr. Don Perking exam: Tone: There is mild increased tone in the RUE  Abnormal movements: There is R>LUE rest tremor;  there is minimal dyskinesia on the L shoulder Coordination:  There is mild decremation with hand opening and closing on the L and finger taps on the R.  All other RAMs are nl Gait and Station: The patient has no difficulty arising out of a deep-seated chair without the use of the hands. The patient is wide based and flexed at knees. Mildly shuffle and drags R leg a little.  He is mildly unstable.  PAIN:  Are you having pain? No  PRECAUTIONS: None  RED FLAGS: None   WEIGHT BEARING RESTRICTIONS: No  FALLS: Has patient fallen in last 6 months? No  LIVING ENVIRONMENT: Lives with: lives with their spouse Lives in: House/apartment Stairs: Yes: External: 1 steps; none Has following equipment at home: None  PLOF: Independent  PATIENT GOALS: would like to work on balance, strength  OBJECTIVE:  Note: Objective measures were completed at Evaluation unless otherwise noted.  DIAGNOSTIC FINDINGS: no imaging for current episode  COGNITION: Overall cognitive status: Within functional limits for tasks assessed   SENSATION: Not tested  COORDINATION: Decreased rapid alternating movements initially, improved with repetition  EDEMA:  none  MUSCLE TONE: more appreciable tone to left hamstrings and right quad  MUSCLE LENGTH: Hamstrings: Right 0 deg; Left -5 deg   DTRs:  NT  POSTURE: No Significant postural  limitations  LOWER EXTREMITY ROM:     Active  Right Eval Left Eval  Hip flexion    Hip extension    Hip abduction    Hip adduction    Hip internal rotation    Hip external rotation    Knee flexion    Knee extension    Ankle dorsiflexion 15 15  Ankle plantarflexion    Ankle inversion    Ankle eversion     (Blank rows = not tested)  LOWER EXTREMITY MMT:    MMT Right Eval Left Eval  Hip flexion    Hip extension    Hip abduction    Hip adduction    Hip internal rotation    Hip external rotation    Knee flexion 5 5  Knee extension 5 5  Ankle dorsiflexion 5 5  Ankle plantarflexion    Ankle inversion    Ankle eversion    (Blank rows = not tested)  BED MOBILITY:  Indep  TRANSFERS: Assistive device utilized: None  Sit to stand: Complete Independence Stand to sit: Complete Independence Chair to chair: Complete Independence Floor:  NT    CURB:  Level of Assistance: Complete Independence Assistive device utilized: None Curb Comments:   STAIRS: NT  GAIT: Gait pattern:  somewhat unusual gait patter, difficult to characterize RLE>LLE Distance walked:  Assistive device utilized: None Level of assistance: Complete Independence Comments:   FUNCTIONAL TESTS:  5 times sit to stand: 16 sec Mini-BESTest:  18/28 3 meter backward walk: 9.75 sec = 0.99 ft/sec = 0.3 m/s (A time of 10.31 s was found to best discriminate fallers from non-fallers with PD.) 10 meter walk test: 10.63 sec = 3.0 ft/sec M-CTSIB: condition 1: normal. Condition 2: mild-mod. Condition 3: mild. Condition 4 mod-severe   TODAY'S TREATMENT:  DATE: 01/02/23    PATIENT EDUCATION: Education details: assessment details, rationale of PT intervention, explanation of HIIT training for PD Person educated: Patient Education method: Explanation Education comprehension:  verbalized understanding  HOME EXERCISE PROGRAM: TBD  GOALS: Goals reviewed with patient? Yes  SHORT TERM GOALS: Target date: 01/23/2023    Patient will be independent in HEP to improve functional outcomes Baseline: Goal status: INITIAL  2.  Demo improved balance (lower risk for falls) and BLE strength per time 13 sec 5xSTS test Baseline: 16 sec Goal status: INITIAL    LONG TERM GOALS: Target date: 02/13/2023    Demo improved motor control and reduced risk for falls per score 24/28 Mini-BESTest to improve safety with community/golf course mobility Baseline: 18/28 Goal status: INITIAL  2.  Demo improved balance and coordination with reduced risk for falls per time of 7.5 sec 3 Meter Backwards Walk Test Baseline: 9.75 sec Goal status: INITIAL  3.  Report/demo teach-back of relevant exercise strategies and interventions relative to treatment of Parkinson's Disease Baseline:  Goal status: INITIAL   ASSESSMENT:  CLINICAL IMPRESSION: Patient is a 77 y.o. male who was seen today for physical therapy evaluation and treatment for PD.  Exhibits balance and strength deficits as well as motor control and coordination deficits impacting mobility exhibiting increased risk for falls per several outcome measures.  Pt reports overall active lifestyle with sporadic walking routine and playing golf 3x/wk but does not verbalize understanding of relevant exercise interventions as it pertains to PD.  Pt would benefit from PT sessions to develop and instruct in HEP and guided activities to reduce effects of chronic, progressive neurologic disease to improve mobility and enable continued activity participation.    OBJECTIVE IMPAIRMENTS: Abnormal gait, decreased activity tolerance, decreased balance, decreased coordination, decreased endurance, decreased knowledge of use of DME, difficulty walking, decreased strength, and impaired tone.   ACTIVITY LIMITATIONS: carrying, bending, squatting, and  locomotion level  PARTICIPATION LIMITATIONS: cleaning, laundry, community activity, yard work, and golf  PERSONAL FACTORS: Age, Time since onset of injury/illness/exacerbation, and 1 comorbidity: hx of back and RLE injury  are also affecting patient's functional outcome.   REHAB POTENTIAL: Excellent  CLINICAL DECISION MAKING: Evolving/moderate complexity  EVALUATION COMPLEXITY: Moderate  PLAN:  PT FREQUENCY: 1-2x/week  PT DURATION: 6 weeks  PLANNED INTERVENTIONS: 97110-Therapeutic exercises, 97530- Therapeutic activity, [id number]- Neuromuscular re-education, 97535- Self Care, 30865- Manual therapy, [phone]- Gait training, [phone]- Aquatic Therapy, Patient/Family education, Balance training, Stair training, Vestibular training, and DME instructions  PLAN FOR NEXT SESSION: corner balance for multisensory/postural stability, cycling HIIT (referenced Jacqlyn Krauss protocol)   11:23 AM, 01/02/23 M. Shary Decamp, PT, DPT Physical Therapist- [place]  Office Number: [phone]

## 2023-01-07 ENCOUNTER — Ambulatory Visit: Payer: PPO | Admitting: Occupational Therapy

## 2023-01-07 ENCOUNTER — Ambulatory Visit: Payer: PPO

## 2023-01-07 DIAGNOSIS — R29818 Other symptoms and signs involving the nervous system: Secondary | ICD-10-CM

## 2023-01-07 DIAGNOSIS — R278 Other lack of coordination: Secondary | ICD-10-CM

## 2023-01-07 DIAGNOSIS — R2689 Other abnormalities of gait and mobility: Secondary | ICD-10-CM

## 2023-01-07 DIAGNOSIS — R2681 Unsteadiness on feet: Secondary | ICD-10-CM

## 2023-01-07 DIAGNOSIS — M6281 Muscle weakness (generalized): Secondary | ICD-10-CM

## 2023-01-07 DIAGNOSIS — G20B1 Parkinson's disease with dyskinesia, without mention of fluctuations: Secondary | ICD-10-CM

## 2023-01-07 DIAGNOSIS — R262 Difficulty in walking, not elsewhere classified: Secondary | ICD-10-CM

## 2023-01-07 NOTE — Therapy (Signed)
OUTPATIENT PHYSICAL THERAPY NEURO TREATMENT   Patient Name: Bryce Wood MRN: [id number] DOB:[birth year], 77 y.o., male Today's Date: 01/07/2023   PCP: Kristian Covey, MD REFERRING PROVIDER: Vladimir Faster, DO  END OF SESSION:  PT End of Session - 01/07/23 1015     Visit Number 2    Number of Visits 13    Date for PT Re-Evaluation 02/13/23    Authorization Type [employer]    PT Start Time 1015    PT Stop Time 1100    PT Time Calculation (min) 45 min             Past Medical History:  Diagnosis Date   ARTHRITIS 08/01/2008   Cancer (HCC) 01/2019   basal cell removed from face   HYPERLIPIDEMIA 11/21/2008   HYPERTENSION 08/01/2008   OSTEOARTHRITIS 08/01/2008   Parkinson's disease (HCC)    Past Surgical History:  Procedure Laterality Date   CIRCUMCISION N/A 04/22/2019   Procedure: CIRCUMCISION ADULT;  Surgeon: Bjorn Pippin, MD;  Location: [facility];  Service: Urology;  Laterality: N/A;   circumcison     HERNIA REPAIR  1979   inguinal   SPINE SURGERY  1999   disk l4 to l5   Patient Active Problem List   Diagnosis Date Noted   Right shoulder pain 02/13/2022   Degenerative arthritis of right knee 06/11/2017   Parkinson's disease (HCC) 10/03/2016   Arthritis of left shoulder region 11/28/2014   Pes planus of both feet 01/04/2014   Localized osteoarthrosis, lower leg 10/11/2013   Chronic meniscal tear of knee 10/11/2013   Hyperlipidemia 11/21/2008   Essential hypertension 08/01/2008   Osteoarthritis 08/01/2008   Arthropathy 08/01/2008    ONSET DATE: 2018  REFERRING DIAG: G20.B1 (ICD-10-CM) - Parkinson's disease with dyskinesia without fluctuating manifestations (HCC)  THERAPY DIAG:  Unsteadiness on feet  Other abnormalities of gait and mobility  Difficulty in walking, not elsewhere classified  Parkinson's disease with dyskinesia without fluctuating manifestations (HCC)  Other symptoms and signs involving the nervous  system  Rationale for Evaluation and Treatment: Rehabilitation  SUBJECTIVE:                                                                                                                                                                                             SUBJECTIVE STATEMENT: Doing good today, leg isn't bothersome Pt accompanied by: self  PERTINENT HISTORY:  From Dr. Don Perking exam: Tone: There is mild increased tone in the RUE Abnormal movements: There is R>LUE rest tremor;  there is minimal dyskinesia on the L shoulder Coordination:  There is mild decremation with hand  opening and closing on the L and finger taps on the R.  All other RAMs are nl Gait and Station: The patient has no difficulty arising out of a deep-seated chair without the use of the hands. The patient is wide based and flexed at knees. Mildly shuffle and drags R leg a little.  He is mildly unstable.  PAIN:  Are you having pain? No  PRECAUTIONS: None  RED FLAGS: None   WEIGHT BEARING RESTRICTIONS: No  FALLS: Has patient fallen in last 6 months? No  LIVING ENVIRONMENT: Lives with: lives with their spouse Lives in: House/apartment Stairs: Yes: External: 1 steps; none Has following equipment at home: None  PLOF: Independent  PATIENT GOALS: would like to work on balance, strength  OBJECTIVE:   TODAY'S TREATMENT: 01/07/23 Activity Comments  NU-step speed intervals x 8 min Level 4 resistance. 2 min warm-up 30 sec speed (100+ SPM); 30 sec slow  Backwards walk w/ support x 2 min Mirror and verbal feedback for sequence  Standing PWR moves  1x10 slow rehearsal 1x10 with flow  Review of stretching he does at home           Note: Objective measures were completed at Evaluation unless otherwise noted.  DIAGNOSTIC FINDINGS: no imaging for current episode  COGNITION: Overall cognitive status: Within functional limits for tasks assessed   SENSATION: Not tested  COORDINATION: Decreased rapid  alternating movements initially, improved with repetition  EDEMA:  none  MUSCLE TONE: more appreciable tone to left hamstrings and right quad  MUSCLE LENGTH: Hamstrings: Right 0 deg; Left -5 deg   DTRs:  NT  POSTURE: No Significant postural limitations  LOWER EXTREMITY ROM:     Active  Right Eval Left Eval  Hip flexion    Hip extension    Hip abduction    Hip adduction    Hip internal rotation    Hip external rotation    Knee flexion    Knee extension    Ankle dorsiflexion 15 15  Ankle plantarflexion    Ankle inversion    Ankle eversion     (Blank rows = not tested)  LOWER EXTREMITY MMT:    MMT Right Eval Left Eval  Hip flexion    Hip extension    Hip abduction    Hip adduction    Hip internal rotation    Hip external rotation    Knee flexion 5 5  Knee extension 5 5  Ankle dorsiflexion 5 5  Ankle plantarflexion    Ankle inversion    Ankle eversion    (Blank rows = not tested)  BED MOBILITY:  Indep  TRANSFERS: Assistive device utilized: None  Sit to stand: Complete Independence Stand to sit: Complete Independence Chair to chair: Complete Independence Floor:  NT    CURB:  Level of Assistance: Complete Independence Assistive device utilized: None Curb Comments:   STAIRS: NT  GAIT: Gait pattern:  somewhat unusual gait patter, difficult to characterize RLE>LLE Distance walked:  Assistive device utilized: None Level of assistance: Complete Independence Comments:   FUNCTIONAL TESTS:  5 times sit to stand: 16 sec Mini-BESTest:  18/28 3 meter backward walk: 9.75 sec = 0.99 ft/sec = 0.3 m/s (A time of 10.31 s was found to best discriminate fallers from non-fallers with PD.) 10 meter walk test: 10.63 sec = 3.0 ft/sec M-CTSIB: condition 1: normal. Condition 2: mild-mod. Condition 3: mild. Condition 4 mod-severe   TODAY'S TREATMENT:  DATE: 01/02/23    PATIENT EDUCATION: Education details: assessment details, rationale of PT intervention, explanation of HIIT training for PD Person educated: Patient Education method: Explanation Education comprehension: verbalized understanding  HOME EXERCISE PROGRAM: TBD  GOALS: Goals reviewed with patient? Yes  SHORT TERM GOALS: Target date: 01/23/2023    Patient will be independent in HEP to improve functional outcomes Baseline: Goal status: INITIAL  2.  Demo improved balance (lower risk for falls) and BLE strength per time 13 sec 5xSTS test Baseline: 16 sec Goal status: INITIAL    LONG TERM GOALS: Target date: 02/13/2023    Demo improved motor control and reduced risk for falls per score 24/28 Mini-BESTest to improve safety with community/golf course mobility Baseline: 18/28 Goal status: INITIAL  2.  Demo improved balance and coordination with reduced risk for falls per time of 7.5 sec 3 Meter Backwards Walk Test Baseline: 9.75 sec Goal status: INITIAL  3.  Report/demo teach-back of relevant exercise strategies and interventions relative to treatment of Parkinson's Disease Baseline:  Goal status: INITIAL   ASSESSMENT:  CLINICAL IMPRESSION: Initiated with training in NU-step for fast alternating movements and progressing to HEP development of retro-walking with emphasis on increased stride and using UE support to faiclittate.  Instrutced in standing large amplitude movements to improve coordination, flexibliity , and balance to reduce risk for falls.  Pt reviewed exercises he performs at home prior to golfing with minor critiques for form. Continued sessions to progress rehabilitation strategies for PD  OBJECTIVE IMPAIRMENTS: Abnormal gait, decreased activity tolerance, decreased balance, decreased coordination, decreased endurance, decreased knowledge of use of DME, difficulty walking, decreased strength, and impaired tone.   ACTIVITY  LIMITATIONS: carrying, bending, squatting, and locomotion level  PARTICIPATION LIMITATIONS: cleaning, laundry, community activity, yard work, and golf  PERSONAL FACTORS: Age, Time since onset of injury/illness/exacerbation, and 1 comorbidity: hx of back and RLE injury  are also affecting patient's functional outcome.   REHAB POTENTIAL: Excellent  CLINICAL DECISION MAKING: Evolving/moderate complexity  EVALUATION COMPLEXITY: Moderate  PLAN:  PT FREQUENCY: 1-2x/week  PT DURATION: 6 weeks  PLANNED INTERVENTIONS: 97110-Therapeutic exercises, 97530- Therapeutic activity, [id number]- Neuromuscular re-education, 97535- Self Care, [id number]- Manual therapy, [phone]- Gait training, [phone]- Aquatic Therapy, Patient/Family education, Balance training, Stair training, Vestibular training, and DME instructions  PLAN FOR NEXT SESSION: corner balance for multisensory/postural stability, cycling HIIT (referenced Jacqlyn Krauss protocol), retrowalking, PWR moves   10:15 AM, 01/07/23 M. Shary Decamp, PT, DPT Physical Therapist- [place] Office Number: [phone]

## 2023-01-07 NOTE — Patient Instructions (Signed)
PWR! Hands  With elbows bent and hands closed, perform the following: PWR! Up: Close hands and flick fingers open and apart BIG PWR! Hands: Push hands out BIG. Elbows straight, wrists up, fingers open and spread apart BIG.    ** Make each movement big and deliberate so that you feel the movement.  Perform at least 10 repetitions 1x/day, but perform PWR! hands throughout the day when you are having trouble using your hands (picking up/manipulating small objects, writing, eating, typing, sewing, buttoning, etc.).  Coordination Exercises  Perform the following exercises for 10-15 minutes 1 times per day. Perform with right hand(s). Perform using big movements.  Pick up various small objects that are different shapes/sizes and place in container. (paperclips, buttons, keys, dried beans/pasta, coins, screws, nuts/bolts, washers, board game pieces, etc.) Pick up coins and stack one at a time: Pick up with big, intentional movements. Do not drag coin to the edge. (5-10 in a stack) Pick up 5-10 coins one at a time and hold in palm. Then, move coins from palm to fingertips one at time and place in coin bank/container. Flipping Cards: Place deck of cards on the table. Flip cards over by opening your hand big to grasp and then turn your palm up big. Deal cards: Hold 1/2 or whole deck in your hand. Use thumb to push card off top of deck with one big push. Flip card between each finger. Practice writing: Slow down, write big, and focus on forming each letter.

## 2023-01-07 NOTE — Therapy (Signed)
OUTPATIENT OCCUPATIONAL THERAPY [facility]  Treatment Note  Patient Name: Bryce Wood MRN: [id number] DOB:1945-05-24, 77 y.o., male Today's Date: 01/07/2023  PCP: Kristian Covey, MD REFERRING PROVIDER: Vladimir Faster, DO  END OF SESSION:  OT End of Session - 01/07/23 1123     Visit Number 2    Number of Visits 8    Date for OT Re-Evaluation 02/28/23   asking 8 weeks to accommodate scheduling   Authorization Type [employer]    OT Start Time 1103    OT Stop Time 1145    OT Time Calculation (min) 42 min              Past Medical History:  Diagnosis Date   ARTHRITIS 08/01/2008   Cancer (HCC) 01/2019   basal cell removed from face   HYPERLIPIDEMIA 11/21/2008   HYPERTENSION 08/01/2008   OSTEOARTHRITIS 08/01/2008   Parkinson's disease (HCC)    Past Surgical History:  Procedure Laterality Date   CIRCUMCISION N/A 04/22/2019   Procedure: CIRCUMCISION ADULT;  Surgeon: Bjorn Pippin, MD;  Location: [facility];  Service: Urology;  Laterality: N/A;   circumcison     HERNIA REPAIR  1979   inguinal   SPINE SURGERY  1999   disk l4 to l5   Patient Active Problem List   Diagnosis Date Noted   Right shoulder pain 02/13/2022   Degenerative arthritis of right knee 06/11/2017   Parkinson's disease (HCC) 10/03/2016   Arthritis of left shoulder region 11/28/2014   Pes planus of both feet 01/04/2014   Localized osteoarthrosis, lower leg 10/11/2013   Chronic meniscal tear of knee 10/11/2013   Hyperlipidemia 11/21/2008   Essential hypertension 08/01/2008   Osteoarthritis 08/01/2008   Arthropathy 08/01/2008    ONSET DATE: Diagnosed in 2017 (referral date 12/09/22)  REFERRING DIAG: G20.B1 (ICD-10-CM) - Parkinson's disease with dyskinesia without fluctuating manifestations  THERAPY DIAG:  Unsteadiness on feet  Other symptoms and signs involving the nervous system  Other lack of coordination  Muscle weakness (generalized)  Rationale for Evaluation  and Treatment: Rehabilitation  SUBJECTIVE:   SUBJECTIVE STATEMENT: Pt report "a little stiffness in L shoulder." Pt accompanied by: self  PERTINENT HISTORY: HTN, Parkinson's disease, arthritis, HLD  PRECAUTIONS: Fall  WEIGHT BEARING RESTRICTIONS: No  PAIN:  Are you having pain? No  FALLS: Has patient fallen in last 6 months? 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  HAND DOMINANCE: Right  ADLs: Overall ADLs: Mod I Transfers/ambulation related to ADLs: Mod I Eating: increased spilling when eating cereal in the mornings Grooming: Mod I UB Dressing: Mod I LB Dressing: occasional difficulty and increased time with pulling up certain pants and fastening buttons Toileting: Mod I Bathing: Mod I Tub Shower transfers: Mod I Equipment: Transfer tub bench, Grab bars, and Walk in shower  IADLs: Shopping: Mod I Light housekeeping: Mod I - reports doing the vacuuming and dusting, as well as yard work Meal Prep: does not cook, wife does the [employer] mobility: still driving  Medication management: Mod I Financial management: Mod I Handwriting: 50% legible; PPT #1 ([place]) in 19.84 sec, mild-mod micrographia  MOBILITY STATUS: Mildly shuffle and drags R leg a little.  He is mildly unstable.   POSTURE COMMENTS:  No Significant postural limitations  ACTIVITY TOLERANCE: Activity tolerance: WFL for tasks assessed  FUNCTIONAL  OUTCOME MEASURES: Fastening/unfastening 3 buttons: 48.22 sec Physical performance test: PPT#2 (simulated eating) 14.66 sec & PPT#4 (donning/doffing jacket): 20.12 sec  COORDINATION: 9 Hole Peg test: Right: 38.34 sec; Left: 32.40 sec Box and Blocks:  Right 47 blocks, Left 51 blocks Tremors: Resting, action, and Right  UE ROM:   RUE: 114*, LUE: 127* shoulder flexion, approx -10* elbow extension bilaterally  UE MMT:    Grossly 4/5 bilaterally   Grip: R: 46# and L: 62#; lateral pinch: R: 13#, L: 19#; 3 point pinch: R: 12#, L: 16#  SENSATION: WFL  MUSCLE TONE: RUE: Mild and Hypertonic  COGNITION: Overall cognitive status: Within functional limits for tasks assessed  OBSERVATIONS: Bradykinesia; Resting and action tremor RUE > LUE   TODAY'S TREATMENT:                                                                                                                              DATE:  01/07/23 Don/doff jacket: completed in 20.12 sec with good technique, mild difficulty getting second sleeve in - however utilizing light weight cotton gown which was clinging to pt's clothing.   Hand Gripper: with RUE on level 35# with black spring, increasing to 50# with silver spring.  Pt picked up 1 inch blocks with gripper with 0 drops with 35# spring, however demonstrating increased difficulty and dropping 5/20 with 50# spring.  Pt demonstrating mild increase in tremor with 50# resistance, however tremor subsiding with grip and present when grip relaxed.  Pt completed additional attempt with good attention to task and decreased drops.  Incorporated reach as target on L and on elevated surface to facilitate some shoulder flexion and reach into sustained grasp.  Large amplitude: OT educating pt on large amplitude finger and hand exercises with full hand open/close, and full arm extension with elbow and wrist extension and fingers open. OT providing min cues for focus on large, purposeful movements while not  aggravating arthritis.   Coordination:  w/ each UE as appropriate, including: picking up various small objects/coins and placing into container, picking up coins and stacking/unstacking them, and picking up 5-10 coins 1 at a time and translating palm to fingertips to place in a stack; flipping cards one at a time off a deck with focus on large amplitude forearm rotation and hand opening, and pushing cards off deck held in-hand using thumb only.  OT providing mod cues for incorporation of forearm supination and hand opening with card flipping and positioning of cards in hand and thumb with pushing cards off deck.  OT educated on functional carryover of tasks.  01/02/23 NA, eval only    PATIENT EDUCATION: Education details: ongoing condition specific education Person educated: Patient Education method: Explanation Education comprehension: verbalized understanding and needs further education  HOME EXERCISE PROGRAM: 01/07/23: Large amplitude and coordination HEP (see pt instructions)  GOALS: Goals reviewed with patient? Yes  SHORT TERM GOALS: Target date: 01/31/23  Pt will be Independent with PD specific HEP. Baseline: Goal status: IN PROGRESS  2.  Pt will demonstrate improved fine motor coordination for ADLs as evidenced by decreasing 9 hole peg test score for RUE by 5 secs Baseline: Right: 38.34 sec; Left: 32.40 sec Goal status: IN PROGRESS  3.  Pt will verbalize understanding of ways to prevent future PD related complications and PD community resources. Baseline: interest in exercise recommendations, however decreased interest in group exercise programs Goal status: IN PROGRESS   LONG TERM GOALS: Target date: 02/28/23  Pt will demonstrate improved ease with fastening buttons as evidenced by decreasing 3 button/ unbutton time to <35 seconds. Baseline: 3 buttons: 48.22 sec Goal status: IN PROGRESS  2.  Pt will demonstrate improved grip strength by #5 on RUE to allow for  increased ease with LB dressing and opening containers. Baseline: R: 46# and L: 62#; Goal status: IN PROGRESS  3.  Pt will write a 10-15 item grocery list with 75% legibility and no significant decrease in letter size Baseline: 50% legibility, min-mod micrographia Goal status: IN PROGRESS  4.  Pt will verbalize and/or demonstrate improved ease with getting out of bed, particularly pulling covers on/off. Baseline:  Goal status: IN PROGRESS  ASSESSMENT:  CLINICAL IMPRESSION: Pt engaged in grip strengthening and coordination activities with good attention and improved tolerance to increased resistance on hand gripper.  Pt demonstrating decreased supination with card flipping, may benefit from focus on forearm supination in future sessions.  Pt also continues with decreased coordination R> L especially with manipulation of coins and cards.    PERFORMANCE DEFICITS: in functional skills including ADLs, IADLs, coordination, tone, ROM, strength, flexibility, Fine motor control, Gross motor control, mobility, body mechanics, endurance, decreased knowledge of precautions, decreased knowledge of use of DME, and UE functional use and psychosocial skills including coping strategies and routines and behaviors.   IMPAIRMENTS: are limiting patient from ADLs, IADLs, and play.      PLAN:  OT FREQUENCY: 1-2x/week  OT DURATION: 6 weeks (asking for 8 to accommodate scheduling)  PLANNED INTERVENTIONS: 40102 OT Re-evaluation, 97535 self care/ADL training, 72536 therapeutic exercise, 97530 therapeutic activity, 97112 neuromuscular re-education, 97140 manual therapy, 97035 ultrasound, 97010 moist heat, 97010 cryotherapy, passive range of motion, functional mobility training, psychosocial skills training, energy conservation, coping strategies training, patient/family education, and DME and/or AE instructions  RECOMMENDED OTHER SERVICES: NA  CONSULTED AND AGREED WITH PLAN OF CARE: Patient  PLAN FOR NEXT  SESSION: Add to large amplitude HEP, coordination and strengthening HEP; review purposeful movements and grasp with carrying items, self-feeding, and handwriting.   Rosalio Loud, OTR/L 01/07/2023, 11:28 AM   [facility] at [facility] [address] [place], [address] [place], [place] [address] Phone # [phone] Fax # [phone]

## 2023-01-10 ENCOUNTER — Encounter: Payer: PPO | Admitting: Family Medicine

## 2023-01-16 DIAGNOSIS — Z08 Encounter for follow-up examination after completed treatment for malignant neoplasm: Secondary | ICD-10-CM | POA: Diagnosis not present

## 2023-01-16 DIAGNOSIS — Z86007 Personal history of in-situ neoplasm of skin: Secondary | ICD-10-CM | POA: Diagnosis not present

## 2023-01-16 DIAGNOSIS — D225 Melanocytic nevi of trunk: Secondary | ICD-10-CM | POA: Diagnosis not present

## 2023-01-16 DIAGNOSIS — L570 Actinic keratosis: Secondary | ICD-10-CM | POA: Diagnosis not present

## 2023-01-16 DIAGNOSIS — L814 Other melanin hyperpigmentation: Secondary | ICD-10-CM | POA: Diagnosis not present

## 2023-01-16 DIAGNOSIS — L821 Other seborrheic keratosis: Secondary | ICD-10-CM | POA: Diagnosis not present

## 2023-01-16 DIAGNOSIS — Z85828 Personal history of other malignant neoplasm of skin: Secondary | ICD-10-CM | POA: Diagnosis not present

## 2023-01-20 ENCOUNTER — Encounter: Payer: PPO | Admitting: Family Medicine

## 2023-01-23 ENCOUNTER — Ambulatory Visit: Payer: PPO | Admitting: Physical Therapy

## 2023-01-23 ENCOUNTER — Encounter: Payer: Self-pay | Admitting: Physical Therapy

## 2023-01-23 ENCOUNTER — Ambulatory Visit: Payer: PPO | Attending: Neurology | Admitting: Occupational Therapy

## 2023-01-23 DIAGNOSIS — R2681 Unsteadiness on feet: Secondary | ICD-10-CM

## 2023-01-23 DIAGNOSIS — R2689 Other abnormalities of gait and mobility: Secondary | ICD-10-CM | POA: Insufficient documentation

## 2023-01-23 DIAGNOSIS — R262 Difficulty in walking, not elsewhere classified: Secondary | ICD-10-CM | POA: Diagnosis not present

## 2023-01-23 DIAGNOSIS — R278 Other lack of coordination: Secondary | ICD-10-CM | POA: Diagnosis not present

## 2023-01-23 DIAGNOSIS — M6281 Muscle weakness (generalized): Secondary | ICD-10-CM | POA: Diagnosis not present

## 2023-01-23 DIAGNOSIS — R29818 Other symptoms and signs involving the nervous system: Secondary | ICD-10-CM | POA: Diagnosis not present

## 2023-01-23 NOTE — Therapy (Signed)
 OUTPATIENT OCCUPATIONAL THERAPY [facility]  Treatment Note  Patient Name: Bryce Wood MRN: [id number] DOB:Apr 04, 1945, 78 y.o., male Today's Date: 01/23/2023  PCP: Micheal Wolm ORN, MD REFERRING PROVIDER: Evonnie Asberry RAMAN, DO  END OF SESSION:  OT End of Session - 01/23/23 0810     Visit Number 3    Number of Visits 8    Date for OT Re-Evaluation 02/28/23   asking 8 weeks to accommodate scheduling   Authorization Type [employer]    OT Start Time 0801    OT Stop Time 0845    OT Time Calculation (min) 44 min               Past Medical History:  Diagnosis Date   ARTHRITIS 08/01/2008   Cancer (HCC) 01/2019   basal cell removed from face   HYPERLIPIDEMIA 11/21/2008   HYPERTENSION 08/01/2008   OSTEOARTHRITIS 08/01/2008   Parkinson's disease (HCC)    Past Surgical History:  Procedure Laterality Date   CIRCUMCISION N/A 04/22/2019   Procedure: CIRCUMCISION ADULT;  Surgeon: Watt Rush, MD;  Location: [facility];  Service: Urology;  Laterality: N/A;   circumcison     HERNIA REPAIR  1979   inguinal   SPINE SURGERY  1999   disk l4 to l5   Patient Active Problem List   Diagnosis Date Noted   Right shoulder pain 02/13/2022   Degenerative arthritis of right knee 06/11/2017   Parkinson's disease (HCC) 10/03/2016   Arthritis of left shoulder region 11/28/2014   Pes planus of both feet 01/04/2014   Localized osteoarthrosis, lower leg 10/11/2013   Chronic meniscal tear of knee 10/11/2013   Hyperlipidemia 11/21/2008   Essential hypertension 08/01/2008   Osteoarthritis 08/01/2008   Arthropathy 08/01/2008    ONSET DATE: Diagnosed in 2017 (referral date 12/09/22)  REFERRING DIAG: G20.B1 (ICD-10-CM) - Parkinson's disease with dyskinesia without fluctuating manifestations  THERAPY DIAG:  Other symptoms and signs involving the nervous system  Other lack of coordination  Muscle weakness (generalized)  Rationale for Evaluation and Treatment:  Rehabilitation  SUBJECTIVE:   SUBJECTIVE STATEMENT: Pt reports having a tooth pulled last week and still having some pain and difficulty sleeping due to persistent pain. Pt accompanied by: self  PERTINENT HISTORY: HTN, Parkinson's disease, arthritis, HLD  PRECAUTIONS: Fall  WEIGHT BEARING RESTRICTIONS: No  PAIN:  Are you having pain? No, reports taking pain meds to relieve pain in mouth  FALLS: Has patient fallen in last 6 months? 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  HAND DOMINANCE: Right  ADLs: Overall ADLs: Mod I Transfers/ambulation related to ADLs: Mod I Eating: increased spilling when eating cereal in the mornings Grooming: Mod I UB Dressing: Mod I LB Dressing: occasional difficulty and increased time with pulling up certain pants and fastening buttons Toileting: Mod I Bathing: Mod I Tub Shower transfers: Mod I Equipment: Transfer tub bench, Grab bars, and Walk in shower  IADLs: Shopping: Mod I Light housekeeping: Mod I - reports doing the vacuuming and dusting,  as well as yard work Meal Prep: does not cook, wife does the [employer] mobility: still driving Medication management: Mod I Financial management: Mod I Handwriting: 50% legible; PPT #1 ([place]) in 19.84 sec, mild-mod micrographia  MOBILITY STATUS: Mildly shuffle and drags R leg a little.  He is mildly unstable.   POSTURE COMMENTS:  No  Significant postural limitations  ACTIVITY TOLERANCE: Activity tolerance: WFL for tasks assessed  FUNCTIONAL OUTCOME MEASURES: Fastening/unfastening 3 buttons: 48.22 sec Physical performance test: PPT#2 (simulated eating) 14.66 sec & PPT#4 (donning/doffing jacket): 20.12 sec  COORDINATION: 9 Hole Peg test: Right: 38.34 sec; Left: 32.40 sec Box and Blocks:  Right 47 blocks, Left 51 blocks Tremors: Resting, action, and Right  UE ROM:   RUE: 114*, LUE: 127* shoulder flexion, approx -10* elbow extension bilaterally  UE MMT:    Grossly 4/5 bilaterally   Grip: R: 46# and L: 62#; lateral pinch: R: 13#, L: 19#; 3 point pinch: R: 12#, L: 16#  SENSATION: WFL  MUSCLE TONE: RUE: Mild and Hypertonic  COGNITION: Overall cognitive status: Within functional limits for tasks assessed  OBSERVATIONS: Bradykinesia; Resting and action tremor RUE > LUE   TODAY'S TREATMENT:                                                                                                                              DATE:  01/23/23 Shoulder exercises: Engaged in supine and sidelying exercises with open book stretch with BUE.  OT providing initial support in supine to facilitate increased opening and improved positioning.  In sidelying, OT providing intermittent demonstration and cues for amplitude.  Completed 2 sets of 10 each exercise with focus on large amplitude and intentional movements.   Coordination: engaged in small peg board pattern replication with focus on intentional movements when picking up and manipulating small pegs to place into peg board.  Completed with RUE and LUE with mild tremor noted on R when placing pegs into pegboard, increasing with fatigue.  OT educating on taking a break and use of finger flicks to focus on opening hands when recognizing movements as getting smaller.  OT challenged pt to remove pegs from pegboard one at a time and translate to palm to hold up to 10 pegs at a time.  Pt dropping pegs  ~40% of time but demonstrating good attention to task when removing pegs from pegboard. Cards: engaged in flipping cards with picking up one at a time off a deck with focus on large amplitude forearm rotation and hand opening. OT providing cues to attend to forearm rotation, as pt with decreased supination during flipping and placing of cards.      01/07/23 Don/doff jacket: completed in 20.12 sec with good technique, mild difficulty getting second sleeve in - however utilizing light weight cotton gown which was clinging to pt's clothing.   Hand Gripper: with RUE on level 35# with black spring, increasing to 50# with  silver spring.  Pt picked up 1 inch blocks with gripper with 0 drops with 35# spring, however demonstrating increased difficulty and dropping 5/20 with 50# spring.  Pt demonstrating mild increase in tremor with 50# resistance, however tremor subsiding with grip and present when grip relaxed.  Pt completed additional attempt with good attention to task and decreased drops.  Incorporated reach as target on L and on elevated surface to facilitate some shoulder flexion and reach into sustained grasp.  Large amplitude: OT educating pt on large amplitude finger and hand exercises with full hand open/close, and full arm extension with elbow and wrist extension and fingers open. OT providing min cues for focus on large, purposeful movements while not aggravating arthritis.   Coordination:  w/ each UE as appropriate, including: picking up various small objects/coins and placing into container, picking up coins and stacking/unstacking them, and picking up 5-10 coins 1 at a time and translating palm to fingertips to place in a stack; flipping cards one at a time off a deck with focus on large amplitude forearm rotation and hand opening, and pushing cards off deck held in-hand using thumb only.  OT providing mod cues for incorporation of forearm supination and hand opening with card flipping and positioning  of cards in hand and thumb with pushing cards off deck.  OT educated on functional carryover of tasks.     01/02/23 NA, eval only    PATIENT EDUCATION: Education details: ongoing condition specific education Person educated: Patient Education method: Explanation Education comprehension: verbalized understanding and needs further education  HOME EXERCISE PROGRAM: 01/07/23: Large amplitude and coordination HEP (see pt instructions)  Access Code: [email or web address] URL: [email or web address] Date: 01/23/2023 Prepared by: [facility] - Outpatient  Rehab - [facility]  Exercises - Open Book Chest Stretch on Towel Roll  - 1 x daily - 2 sets - 10 reps - Sidelying Open Book Thoracic Lumbar Rotation and Extension  - 1 x daily - 2 sets - 10 reps  GOALS: Goals reviewed with patient? Yes  SHORT TERM GOALS: Target date: 01/31/23  Pt will be Independent with PD specific HEP. Baseline: Goal status: IN PROGRESS  2.  Pt will demonstrate improved fine motor coordination for ADLs as evidenced by decreasing 9 hole peg test score for RUE by 5 secs Baseline: Right: 38.34 sec; Left: 32.40 sec Goal status: IN PROGRESS  3.  Pt will verbalize understanding of ways to prevent future PD related complications and PD community resources. Baseline: interest in exercise recommendations, however decreased interest in group exercise programs Goal status: IN PROGRESS   LONG TERM GOALS: Target date: 02/28/23  Pt will demonstrate improved ease with fastening buttons as evidenced by decreasing 3 button/ unbutton time to <35 seconds. Baseline: 3 buttons: 48.22 sec Goal status: IN PROGRESS  2.  Pt will demonstrate improved grip strength by #5 on RUE to allow for increased ease with LB dressing and opening containers. Baseline: R: 46# and L: 62#; Goal status: IN PROGRESS  3.  Pt will write a 10-15 item grocery list with 75% legibility and no significant decrease in letter size Baseline: 50%  legibility, min-mod micrographia Goal status: IN PROGRESS  4.  Pt will verbalize and/or demonstrate improved ease with getting out of bed, particularly pulling covers on/off. Baseline:  Goal status: IN PROGRESS  ASSESSMENT:  CLINICAL IMPRESSION: Pt demonstrating decreased shoulder flexion and supination during supine and sidelying open book chest stretch and card flipping task.  OT providing cues and demonstration to facilitate  increased chest opening and shoulder flexion.  Pt benefits from cues for amplitude and head turns to facilitate increased opening and attention to amplitude.  Pt demonstrating decreased supination with card flipping, may continue to benefit from focus on forearm supination in future sessions.  Pt also continues with decreased coordination R> L especially with manipulation of small pegs this session.   PERFORMANCE DEFICITS: in functional skills including ADLs, IADLs, coordination, tone, ROM, strength, flexibility, Fine motor control, Gross motor control, mobility, body mechanics, endurance, decreased knowledge of precautions, decreased knowledge of use of DME, and UE functional use and psychosocial skills including coping strategies and routines and behaviors.   IMPAIRMENTS: are limiting patient from ADLs, IADLs, and play.      PLAN:  OT FREQUENCY: 1-2x/week  OT DURATION: 6 weeks (asking for 8 to accommodate scheduling)  PLANNED INTERVENTIONS: 02831 OT Re-evaluation, 97535 self care/ADL training, 02889 therapeutic exercise, 97530 therapeutic activity, 97112 neuromuscular re-education, 97140 manual therapy, 97035 ultrasound, 97010 moist heat, 97010 cryotherapy, passive range of motion, functional mobility training, psychosocial skills training, energy conservation, coping strategies training, patient/family education, and DME and/or AE instructions  RECOMMENDED OTHER SERVICES: NA  CONSULTED AND AGREED WITH PLAN OF CARE: Patient  PLAN FOR NEXT SESSION: Add to  large amplitude HEP, coordination and strengthening HEP; review purposeful movements and grasp with carrying items, self-feeding, and handwriting.   KAYLENE DOMINO, OTR/L 01/23/2023, 9:41 AM   [facility] Outpatient Rehab at [facility] [address] [place], [address] [place], [place] [address] Phone # [phone] Fax # [phone]

## 2023-01-23 NOTE — Therapy (Signed)
 OUTPATIENT PHYSICAL THERAPY NEURO TREATMENT   Patient Name: Bryce Wood MRN: [id number] DOB:1945-11-17, 78 y.o., male Today's Date: 01/23/2023   PCP: Micheal Wolm ORN, MD REFERRING PROVIDER: Evonnie Asberry RAMAN, DO  END OF SESSION:  PT End of Session - 01/23/23 0848     Visit Number 3    Number of Visits 13    Date for PT Re-Evaluation 02/13/23    Authorization Type [employer]    PT Start Time 0850    PT Stop Time 0932    PT Time Calculation (min) 42 min    Activity Tolerance Patient tolerated treatment well    Behavior During Therapy [facility] for tasks assessed/performed              Past Medical History:  Diagnosis Date   ARTHRITIS 08/01/2008   Cancer (HCC) 01/2019   basal cell removed from face   HYPERLIPIDEMIA 11/21/2008   HYPERTENSION 08/01/2008   OSTEOARTHRITIS 08/01/2008   Parkinson's disease (HCC)    Past Surgical History:  Procedure Laterality Date   CIRCUMCISION N/A 04/22/2019   Procedure: CIRCUMCISION ADULT;  Surgeon: Watt Rush, MD;  Location: [facility];  Service: Urology;  Laterality: N/A;   circumcison     HERNIA REPAIR  1979   inguinal   SPINE SURGERY  1999   disk l4 to l5   Patient Active Problem List   Diagnosis Date Noted   Right shoulder pain 02/13/2022   Degenerative arthritis of right knee 06/11/2017   Parkinson's disease (HCC) 10/03/2016   Arthritis of left shoulder region 11/28/2014   Pes planus of both feet 01/04/2014   Localized osteoarthrosis, lower leg 10/11/2013   Chronic meniscal tear of knee 10/11/2013   Hyperlipidemia 11/21/2008   Essential hypertension 08/01/2008   Osteoarthritis 08/01/2008   Arthropathy 08/01/2008    ONSET DATE: 2018  REFERRING DIAG: G20.B1 (ICD-10-CM) - Parkinson's disease with dyskinesia without fluctuating manifestations (HCC)  THERAPY DIAG:  Unsteadiness on feet  Other abnormalities of gait and mobility  Rationale for Evaluation and Treatment: Rehabilitation  SUBJECTIVE:                                                                                                                                                                                              SUBJECTIVE STATEMENT: Had a tooth pulled and that's been a bigger deal than I thought.   Pt accompanied by: self  PERTINENT HISTORY:  From Dr. Collie exam: Tone: There is mild increased tone in the RUE Abnormal movements: There is R>LUE rest tremor;  there is minimal dyskinesia on the L shoulder Coordination:  There is  mild decremation with hand opening and closing on the L and finger taps on the R.  All other RAMs are nl Gait and Station: The patient has no difficulty arising out of a deep-seated chair without the use of the hands. The patient is wide based and flexed at knees. Mildly shuffle and drags R leg a little.  He is mildly unstable.  PAIN:  Are you having pain? No  PRECAUTIONS: None  RED FLAGS: None   WEIGHT BEARING RESTRICTIONS: No  FALLS: Has patient fallen in last 6 months? No  LIVING ENVIRONMENT: Lives with: lives with their spouse Lives in: House/apartment Stairs: Yes: External: 1 steps; none Has following equipment at home: None  PLOF: Independent  PATIENT GOALS: would like to work on balance, strength  OBJECTIVE:    TODAY'S TREATMENT: 01/23/2023 Activity Comments  NuStep, Level 4-7, x 10 minutes Intervals-easy, moderate, hard, rates effort level as 5-7/10 (Per PWR! Moves Progression intervals)  Walking forward with focus on posture, arm swing, 50 ft x 4 reps   Forward/back walking 20 ft x 3 reps   Standing on Airex:  forward step and weightshift x 10, side step and weightshift x 10, back step and weightshift x 10 UE support, cues for foot clearance  Standing on Airex: Marching in place x 10 Feet apart EO head turns/nods Feet apart EC 15 sec Increased unsteadiness with EC      Pt performs PWR! Moves in standing position x 10 reps   PWR! Up for improved posture  PWR!  Rock for improved weighshifting  PWR! Twist for improved trunk rotation   PWR! Step for improved step initiation   Cues provided for technique, pace, intensity of exercise  HOME EXERCISE PROGRAM: Added standing PWR! Moves, 10-20 reps, once/day  PATIENT EDUCATION: Education details: PWR! Moves added to HEP, rationale for large amplitude movement patterns Person educated: Patient Education method: Explanation, Demonstration, Verbal cues, and Handouts Education comprehension: verbalized understanding and returned demonstration   ----------------------------------------------------------------------- Note: Objective measures were completed at Evaluation unless otherwise noted.  DIAGNOSTIC FINDINGS: no imaging for current episode  COGNITION: Overall cognitive status: Within functional limits for tasks assessed   SENSATION: Not tested  COORDINATION: Decreased rapid alternating movements initially, improved with repetition  EDEMA:  none  MUSCLE TONE: more appreciable tone to left hamstrings and right quad  MUSCLE LENGTH: Hamstrings: Right 0 deg; Left -5 deg   DTRs:  NT  POSTURE: No Significant postural limitations  LOWER EXTREMITY ROM:     Active  Right Eval Left Eval  Hip flexion    Hip extension    Hip abduction    Hip adduction    Hip internal rotation    Hip external rotation    Knee flexion    Knee extension    Ankle dorsiflexion 15 15  Ankle plantarflexion    Ankle inversion    Ankle eversion     (Blank rows = not tested)  LOWER EXTREMITY MMT:    MMT Right Eval Left Eval  Hip flexion    Hip extension    Hip abduction    Hip adduction    Hip internal rotation    Hip external rotation    Knee flexion 5 5  Knee extension 5 5  Ankle dorsiflexion 5 5  Ankle plantarflexion    Ankle inversion    Ankle eversion    (Blank rows = not tested)  BED MOBILITY:  Indep  TRANSFERS: Assistive device utilized: None  Sit to stand: Complete  Independence Stand to sit: Complete Independence Chair to chair: Complete Independence Floor:  NT    CURB:  Level of Assistance: Complete Independence Assistive device utilized: None Curb Comments:   STAIRS: NT  GAIT: Gait pattern:  somewhat unusual gait patter, difficult to characterize RLE>LLE Distance walked:  Assistive device utilized: None Level of assistance: Complete Independence Comments:   FUNCTIONAL TESTS:  5 times sit to stand: 16 sec Mini-BESTest:  18/28 3 meter backward walk: 9.75 sec = 0.99 ft/sec = 0.3 m/s (A time of 10.31 s was found to best discriminate fallers from non-fallers with PD.) 10 meter walk test: 10.63 sec = 3.0 ft/sec M-CTSIB: condition 1: normal. Condition 2: mild-mod. Condition 3: mild. Condition 4 mod-severe   TODAY'S TREATMENT:                                                                                                                              DATE: 01/02/23    PATIENT EDUCATION: Education details: assessment details, rationale of PT intervention, explanation of HIIT training for PD Person educated: Patient Education method: Explanation Education comprehension: verbalized understanding  HOME EXERCISE PROGRAM: TBD  GOALS: Goals reviewed with patient? Yes  SHORT TERM GOALS: Target date: 01/23/2023    Patient will be independent in HEP to improve functional outcomes Baseline: Goal status: INITIAL  2.  Demo improved balance (lower risk for falls) and BLE strength per time 13 sec 5xSTS test Baseline: 16 sec Goal status: INITIAL    LONG TERM GOALS: Target date: 02/13/2023    Demo improved motor control and reduced risk for falls per score 24/28 Mini-BESTest to improve safety with community/golf course mobility Baseline: 18/28 Goal status: INITIAL  2.  Demo improved balance and coordination with reduced risk for falls per time of 7.5 sec 3 Meter Backwards Walk Test Baseline: 9.75 sec Goal status: INITIAL  3.   Report/demo teach-back of relevant exercise strategies and interventions relative to treatment of Parkinson's Disease Baseline:  Goal status: INITIAL   ASSESSMENT:  CLINICAL IMPRESSION: Skilled PT session today focused on continued instruction on interval training with aerobic warm-up activity, with cues for self-rating effort levelas 6-8/10 through aerobic exercise.  Also initiated standing PWR! Moves as HEP and pt responds well to cues for intensity and large amplitude motions.  He does have initial LOB with step exercise, but is able to recover and do better with repetition.  With compliant surfaces, he is most unsteady with EC.  He will continue to benefit from skilled PT towards goals for improved functional mobility and decreased fall risk.    OBJECTIVE IMPAIRMENTS: Abnormal gait, decreased activity tolerance, decreased balance, decreased coordination, decreased endurance, decreased knowledge of use of DME, difficulty walking, decreased strength, and impaired tone.   ACTIVITY LIMITATIONS: carrying, bending, squatting, and locomotion level  PARTICIPATION LIMITATIONS: cleaning, laundry, community activity, yard work, and golf  PERSONAL FACTORS: Age, Time since onset of injury/illness/exacerbation, and 1 comorbidity: hx of back and  RLE injury  are also affecting patient's functional outcome.   REHAB POTENTIAL: Excellent  CLINICAL DECISION MAKING: Evolving/moderate complexity  EVALUATION COMPLEXITY: Moderate  PLAN:  PT FREQUENCY: 1-2x/week  PT DURATION: 6 weeks  PLANNED INTERVENTIONS: 97110-Therapeutic exercises, 97530- Therapeutic activity, [id number]- Neuromuscular re-education, 97535- Self Care, 02859- Manual therapy, [phone]- Gait training, [phone]- Aquatic Therapy, Patient/Family education, Balance training, Stair training, Vestibular training, and DME instructions  PLAN FOR NEXT SESSION: Check STGs; review PWR! Moves; corner balance for multisensory/postural stability, cycling HIIT  (referenced [facility] protocol), retrowalking   Greig Anon, PT 01/23/23 12:30 PM Phone: [phone] Fax: [phone]  [facility] [address], [address] [place], [place] [address] Phone # [phone] Fax # [phone]

## 2023-01-30 ENCOUNTER — Ambulatory Visit: Payer: PPO

## 2023-01-30 ENCOUNTER — Ambulatory Visit: Payer: PPO | Admitting: Occupational Therapy

## 2023-01-30 DIAGNOSIS — R2681 Unsteadiness on feet: Secondary | ICD-10-CM

## 2023-01-30 DIAGNOSIS — R2689 Other abnormalities of gait and mobility: Secondary | ICD-10-CM

## 2023-01-30 DIAGNOSIS — R262 Difficulty in walking, not elsewhere classified: Secondary | ICD-10-CM

## 2023-01-30 DIAGNOSIS — R29818 Other symptoms and signs involving the nervous system: Secondary | ICD-10-CM

## 2023-01-30 DIAGNOSIS — R278 Other lack of coordination: Secondary | ICD-10-CM

## 2023-01-30 DIAGNOSIS — M6281 Muscle weakness (generalized): Secondary | ICD-10-CM

## 2023-01-30 NOTE — Therapy (Signed)
OUTPATIENT PHYSICAL THERAPY NEURO TREATMENT   Patient Name: Bryce Wood MRN: [id number] DOB:07-21-1945, 78 y.o., male Today's Date: 01/30/2023   PCP: Kristian Covey, MD REFERRING PROVIDER: Vladimir Faster, DO  END OF SESSION:  PT End of Session - 01/30/23 0749     Visit Number 4    Number of Visits 13    Date for PT Re-Evaluation 02/13/23    Authorization Type [employer]    PT Start Time 0800    PT Stop Time 0845    PT Time Calculation (min) 45 min    Activity Tolerance Patient tolerated treatment well    Behavior During Therapy [facility] for tasks assessed/performed              Past Medical History:  Diagnosis Date   ARTHRITIS 08/01/2008   Cancer (HCC) 01/2019   basal cell removed from face   HYPERLIPIDEMIA 11/21/2008   HYPERTENSION 08/01/2008   OSTEOARTHRITIS 08/01/2008   Parkinson's disease (HCC)    Past Surgical History:  Procedure Laterality Date   CIRCUMCISION N/A 04/22/2019   Procedure: CIRCUMCISION ADULT;  Surgeon: Bjorn Pippin, MD;  Location: [facility];  Service: Urology;  Laterality: N/A;   circumcison     HERNIA REPAIR  1979   inguinal   SPINE SURGERY  1999   disk l4 to l5   Patient Active Problem List   Diagnosis Date Noted   Right shoulder pain 02/13/2022   Degenerative arthritis of right knee 06/11/2017   Parkinson's disease (HCC) 10/03/2016   Arthritis of left shoulder region 11/28/2014   Pes planus of both feet 01/04/2014   Localized osteoarthrosis, lower leg 10/11/2013   Chronic meniscal tear of knee 10/11/2013   Hyperlipidemia 11/21/2008   Essential hypertension 08/01/2008   Osteoarthritis 08/01/2008   Arthropathy 08/01/2008    ONSET DATE: 2018  REFERRING DIAG: G20.B1 (ICD-10-CM) - Parkinson's disease with dyskinesia without fluctuating manifestations (HCC)  THERAPY DIAG:  Unsteadiness on feet  Other abnormalities of gait and mobility  Other symptoms and signs involving the nervous system  Muscle  weakness (generalized)  Difficulty in walking, not elsewhere classified  Rationale for Evaluation and Treatment: Rehabilitation  SUBJECTIVE:                                                                                                                                                                                             SUBJECTIVE STATEMENT: Still recovering from tooth issue. Working out at [employer] with the stationary bike performing intervals Pt accompanied by: self  PERTINENT HISTORY:  From Dr. Don Perking exam: Tone: There is mild increased tone in the RUE  Abnormal movements: There is R>LUE rest tremor;  there is minimal dyskinesia on the L shoulder Coordination:  There is mild decremation with hand opening and closing on the L and finger taps on the R.  All other RAMs are nl Gait and Station: The patient has no difficulty arising out of a deep-seated chair without the use of the hands. The patient is wide based and flexed at knees. Mildly shuffle and drags R leg a little.  He is mildly unstable.  PAIN:  Are you having pain? No  PRECAUTIONS: None  RED FLAGS: None   WEIGHT BEARING RESTRICTIONS: No  FALLS: Has patient fallen in last 6 months? No  LIVING ENVIRONMENT: Lives with: lives with their spouse Lives in: House/apartment Stairs: Yes: External: 1 steps; none Has following equipment at home: None  PLOF: Independent  PATIENT GOALS: would like to work on balance, strength  OBJECTIVE:   TODAY'S TREATMENT: 01/30/23 Activity Comments  Treadmill attempted   Recumbent bike x 5 min 50-70 RPM for warmup   5xSTS test 12 sec   Seated row 3x10   Seated lat row 3x10   Standing PWR moves 1x10 with 75% recall   Core strength Bridges w/ variations Modified plank         TODAY'S TREATMENT: 01/23/2023 Activity Comments  NuStep, Level 4-7, x 10 minutes Intervals-easy, moderate, hard, rates effort level as 5-7/10 (Per PWR! Moves Progression intervals)  Walking forward with  focus on posture, arm swing, 50 ft x 4 reps   Forward/back walking 20 ft x 3 reps   Standing on Airex:  forward step and weightshift x 10, side step and weightshift x 10, back step and weightshift x 10 UE support, cues for foot clearance  Standing on Airex: Marching in place x 10 Feet apart EO head turns/nods Feet apart EC 15 sec Increased unsteadiness with EC      Pt performs PWR! Moves in standing position x 10 reps   PWR! Up for improved posture  PWR! Rock for improved weighshifting  PWR! Twist for improved trunk rotation   PWR! Step for improved step initiation   Cues provided for technique, pace, intensity of exercise  HOME EXERCISE PROGRAM: Added standing PWR! Moves, 10-20 reps, once/day  PATIENT EDUCATION: Education details: PWR! Moves added to HEP, rationale for large amplitude movement patterns Person educated: Patient Education method: Explanation, Demonstration, Verbal cues, and Handouts Education comprehension: verbalized understanding and returned demonstration   ----------------------------------------------------------------------- Note: Objective measures were completed at Evaluation unless otherwise noted.  DIAGNOSTIC FINDINGS: no imaging for current episode  COGNITION: Overall cognitive status: Within functional limits for tasks assessed   SENSATION: Not tested  COORDINATION: Decreased rapid alternating movements initially, improved with repetition  EDEMA:  none  MUSCLE TONE: more appreciable tone to left hamstrings and right quad  MUSCLE LENGTH: Hamstrings: Right 0 deg; Left -5 deg   DTRs:  NT  POSTURE: No Significant postural limitations  LOWER EXTREMITY ROM:     Active  Right Eval Left Eval  Hip flexion    Hip extension    Hip abduction    Hip adduction    Hip internal rotation    Hip external rotation    Knee flexion    Knee extension    Ankle dorsiflexion 15 15  Ankle plantarflexion    Ankle inversion    Ankle eversion      (Blank rows = not tested)  LOWER EXTREMITY MMT:    MMT Right Eval Left Eval  Hip  flexion    Hip extension    Hip abduction    Hip adduction    Hip internal rotation    Hip external rotation    Knee flexion 5 5  Knee extension 5 5  Ankle dorsiflexion 5 5  Ankle plantarflexion    Ankle inversion    Ankle eversion    (Blank rows = not tested)  BED MOBILITY:  Indep  TRANSFERS: Assistive device utilized: None  Sit to stand: Complete Independence Stand to sit: Complete Independence Chair to chair: Complete Independence Floor:  NT    CURB:  Level of Assistance: Complete Independence Assistive device utilized: None Curb Comments:   STAIRS: NT  GAIT: Gait pattern:  somewhat unusual gait patter, difficult to characterize RLE>LLE Distance walked:  Assistive device utilized: None Level of assistance: Complete Independence Comments:   FUNCTIONAL TESTS:  5 times sit to stand: 16 sec Mini-BESTest:  18/28 3 meter backward walk: 9.75 sec = 0.99 ft/sec = 0.3 m/s (A time of 10.31 s was found to best discriminate fallers from non-fallers with PD.) 10 meter walk test: 10.63 sec = 3.0 ft/sec M-CTSIB: condition 1: normal. Condition 2: mild-mod. Condition 3: mild. Condition 4 mod-severe   TODAY'S TREATMENT:                                                                                                                              DATE: 01/02/23    PATIENT EDUCATION: Education details: assessment details, rationale of PT intervention, explanation of HIIT training for PD Person educated: Patient Education method: Explanation Education comprehension: verbalized understanding  HOME EXERCISE PROGRAM: TBD  GOALS: Goals reviewed with patient? Yes  SHORT TERM GOALS: Target date: 01/23/2023    Patient will be independent in HEP to improve functional outcomes Baseline: Goal status: MET  2.  Demo improved balance (lower risk for falls) and BLE strength per time 13  sec 5xSTS test Baseline: 16 sec; 12 sec Goal status: MET    LONG TERM GOALS: Target date: 02/13/2023    Demo improved motor control and reduced risk for falls per score 24/28 Mini-BESTest to improve safety with community/golf course mobility Baseline: 18/28 Goal status: INITIAL  2.  Demo improved balance and coordination with reduced risk for falls per time of 7.5 sec 3 Meter Backwards Walk Test Baseline: 9.75 sec Goal status: INITIAL  3.  Report/demo teach-back of relevant exercise strategies and interventions relative to treatment of Parkinson's Disease Baseline:  Goal status: INITIAL   ASSESSMENT:  CLINICAL IMPRESSION: Reports consistency with attending [employer] to use exercise bike for intervals for HIIT cardiovascular exercise.  Demonstrates great recall for standing large amplitude movements HEP and improved LE strength and balance per decreased time 5xSTS test meeting 2/2 STG.  Pt with questions regarding strength training and instructed in compound UE strength movements for improved grip strength/power/postural re-ed and core strengthening activities to improve strength and posture. Continued sessions to advance POC details to reduce  risk for falls per outcome measures.  OBJECTIVE IMPAIRMENTS: Abnormal gait, decreased activity tolerance, decreased balance, decreased coordination, decreased endurance, decreased knowledge of use of DME, difficulty walking, decreased strength, and impaired tone.   ACTIVITY LIMITATIONS: carrying, bending, squatting, and locomotion level  PARTICIPATION LIMITATIONS: cleaning, laundry, community activity, yard work, and golf  PERSONAL FACTORS: Age, Time since onset of injury/illness/exacerbation, and 1 comorbidity: hx of back and RLE injury  are also affecting patient's functional outcome.   REHAB POTENTIAL: Excellent  CLINICAL DECISION MAKING: Evolving/moderate complexity  EVALUATION COMPLEXITY: Moderate  PLAN:  PT FREQUENCY: 1-2x/week  PT  DURATION: 6 weeks  PLANNED INTERVENTIONS: 97110-Therapeutic exercises, 97530- Therapeutic activity, [id number]- Neuromuscular re-education, 97535- Self Care, [id number]- Manual therapy, [phone]- Gait training, [phone]- Aquatic Therapy, Patient/Family education, Balance training, Stair training, Vestibular training, and DME instructions  PLAN FOR NEXT SESSION: review PWR! Moves; corner balance for multisensory/postural stability, cycling HIIT (referenced Jacqlyn Krauss protocol), retrowalking   8:54 AM, 01/30/23 M. Shary Decamp, PT, DPT Physical Therapist- [place] Office Number: [phone]

## 2023-01-30 NOTE — Therapy (Signed)
OUTPATIENT OCCUPATIONAL THERAPY PARKINSON'S  Treatment Note  Patient Name: Bryce Wood MRN: [id number] DOB:[birth year], 78 y.o., male Today's Date: 01/30/2023  PCP: Kristian Covey, MD REFERRING PROVIDER: Vladimir Faster, DO  END OF SESSION:  OT End of Session - 01/30/23 0848     Visit Number 4    Number of Visits 8    Date for OT Re-Evaluation 02/28/23   asking 8 weeks to accommodate scheduling   Authorization Type [employer]    OT Start Time 0848    OT Stop Time 0930    OT Time Calculation (min) 42 min                Past Medical History:  Diagnosis Date   ARTHRITIS 08/01/2008   Cancer (HCC) 01/2019   basal cell removed from face   HYPERLIPIDEMIA 11/21/2008   HYPERTENSION 08/01/2008   OSTEOARTHRITIS 08/01/2008   Parkinson's disease (HCC)    Past Surgical History:  Procedure Laterality Date   CIRCUMCISION N/A 04/22/2019   Procedure: CIRCUMCISION ADULT;  Surgeon: Bjorn Pippin, MD;  Location: [facility];  Service: Urology;  Laterality: N/A;   circumcison     HERNIA REPAIR  1979   inguinal   SPINE SURGERY  1999   disk l4 to l5   Patient Active Problem List   Diagnosis Date Noted   Right shoulder pain 02/13/2022   Degenerative arthritis of right knee 06/11/2017   Parkinson's disease (HCC) 10/03/2016   Arthritis of left shoulder region 11/28/2014   Pes planus of both feet 01/04/2014   Localized osteoarthrosis, lower leg 10/11/2013   Chronic meniscal tear of knee 10/11/2013   Hyperlipidemia 11/21/2008   Essential hypertension 08/01/2008   Osteoarthritis 08/01/2008   Arthropathy 08/01/2008    ONSET DATE: Diagnosed in 2017 (referral date 12/09/22)  REFERRING DIAG: G20.B1 (ICD-10-CM) - Parkinson's disease with dyskinesia without fluctuating manifestations  THERAPY DIAG:  Unsteadiness on feet  Other symptoms and signs involving the nervous system  Muscle weakness (generalized)  Other lack of coordination  Rationale for  Evaluation and Treatment: Rehabilitation  SUBJECTIVE:   SUBJECTIVE STATEMENT: Pt reports constipation, suspecting due to medication taken s/p tooth extraction. Pt accompanied by: self  PERTINENT HISTORY: HTN, Parkinson's disease, arthritis, HLD  PRECAUTIONS: Fall  WEIGHT BEARING RESTRICTIONS: No  PAIN:  Are you having pain? No, reports taking pain meds to relieve pain in mouth  FALLS: Has patient fallen in last 6 months? 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  HAND DOMINANCE: Right  ADLs: Overall ADLs: Mod I Transfers/ambulation related to ADLs: Mod I Eating: increased spilling when eating cereal in the mornings Grooming: Mod I UB Dressing: Mod I LB Dressing: occasional difficulty and increased time with pulling up certain pants and fastening buttons Toileting: Mod I Bathing: Mod I Tub Shower transfers: Mod I Equipment: Transfer tub bench, Grab bars, and Walk in shower  IADLs: Shopping: Mod I Light housekeeping: Mod I - reports doing the vacuuming and dusting, as well as yard  work Meal Prep: does not cook, wife does the cooking Community mobility: still driving Medication management: Mod I Financial management: Mod I Handwriting: 50% legible; PPT #1 ([place]) in 19.84 sec, mild-mod micrographia  MOBILITY STATUS: Mildly shuffle and drags R leg a little.  He is mildly unstable.   POSTURE COMMENTS:  No Significant  postural limitations  ACTIVITY TOLERANCE: Activity tolerance: WFL for tasks assessed  FUNCTIONAL OUTCOME MEASURES: Fastening/unfastening 3 buttons: 48.22 sec Physical performance test: PPT#2 (simulated eating) 14.66 sec & PPT#4 (donning/doffing jacket): 20.12 sec  COORDINATION: 9 Hole Peg test: Right: 38.34 sec; Left: 32.40 sec Box and Blocks:  Right 47 blocks, Left 51 blocks Tremors: Resting, action, and Right  UE ROM:   RUE: 114*, LUE: 127* shoulder flexion, approx -10* elbow extension bilaterally  UE MMT:    Grossly 4/5 bilaterally   Grip: R: 46# and L: 62#; lateral pinch: R: 13#, L: 19#; 3 point pinch: R: 12#, L: 16#  SENSATION: WFL  MUSCLE TONE: RUE: Mild and Hypertonic  COGNITION: Overall cognitive status: Within functional limits for tasks assessed  OBSERVATIONS: Bradykinesia; Resting and action tremor RUE > LUE   TODAY'S TREATMENT:                                                                                                                              DATE:  01/30/23 Large amplitude: engaged in large amplitude UE with full arm extension with elbow and wrist extension and fingers open in sitting. Progressing to standing with reaching outside BOS and then across midline with weight shift.  OT utilizing vertical dowels for targets to facilitate consistent reach.  OT educated on completion of tasks at home and providing self with target to facilitate carryover of amplitude. Coordination: engaged in picking up coins and placing into coin slot with focus on intentional movements when picking up and placing into coin slot.  Pt demonstrating increased tremor in R hand while holding cup, therefore OT educating on increased full hand grasp on container and modifying grip with on/off pressure to keep movement more active to decrease tremor. 9 hole peg test: RUE: Trial 1 at 42.69 sec and Trial 2 at 30.78 sec Handwriting: Trialed variety of pens with focus on size and weight of pens  to increase legibility.  OT provided suggestions with focus on printing and use of built up handle/pen grip. Pt continues to demonstrate min-mod micrographia and decreased legibility as he fatigued.    01/23/23 Shoulder exercises: Engaged in supine and sidelying exercises with open book stretch with BUE.  OT providing initial support in supine to facilitate increased opening and improved positioning.  In sidelying, OT providing intermittent demonstration and cues for amplitude.  Completed 2 sets of 10 each exercise with focus on large amplitude and intentional movements.   Coordination: engaged in small peg board pattern replication with focus on intentional movements when picking up and manipulating small pegs to place into  peg board.  Completed with RUE and LUE with mild tremor noted on R when placing pegs into pegboard, increasing with fatigue.  OT educating on taking a break and use of finger flicks to focus on opening hands when recognizing movements as getting smaller.  OT challenged pt to remove pegs from pegboard one at a time and translate to palm to hold up to 10 pegs at a time.  Pt dropping pegs ~40% of time but demonstrating good attention to task when removing pegs from pegboard. Cards: engaged in flipping cards with picking up one at a time off a deck with focus on large amplitude forearm rotation and hand opening. OT providing cues to attend to forearm rotation, as pt with decreased supination during flipping and placing of cards.      01/07/23 Don/doff jacket: completed in 20.12 sec with good technique, mild difficulty getting second sleeve in - however utilizing light weight cotton gown which was clinging to pt's clothing.   Hand Gripper: with RUE on level 35# with black spring, increasing to 50# with silver spring.  Pt picked up 1 inch blocks with gripper with 0 drops with 35# spring, however demonstrating increased difficulty and dropping 5/20 with 50# spring.  Pt demonstrating mild  increase in tremor with 50# resistance, however tremor subsiding with grip and present when grip relaxed.  Pt completed additional attempt with good attention to task and decreased drops.  Incorporated reach as target on L and on elevated surface to facilitate some shoulder flexion and reach into sustained grasp.  Large amplitude: OT educating pt on large amplitude finger and hand exercises with full hand open/close, and full arm extension with elbow and wrist extension and fingers open. OT providing min cues for focus on large, purposeful movements while not aggravating arthritis.   Coordination:  w/ each UE as appropriate, including: picking up various small objects/coins and placing into container, picking up coins and stacking/unstacking them, and picking up 5-10 coins 1 at a time and translating palm to fingertips to place in a stack; flipping cards one at a time off a deck with focus on large amplitude forearm rotation and hand opening, and pushing cards off deck held in-hand using thumb only.  OT providing mod cues for incorporation of forearm supination and hand opening with card flipping and positioning of cards in hand and thumb with pushing cards off deck.  OT educated on functional carryover of tasks.      PATIENT EDUCATION: Education details: ongoing condition specific education Person educated: Patient Education method: Explanation Education comprehension: verbalized understanding and needs further education  HOME EXERCISE PROGRAM: 01/07/23: Large amplitude and coordination HEP (see pt instructions)  Access Code: [email or web address] URL: [email or web address] Date: 01/30/2023 Prepared by: [facility] - Out[facility]  Rehab - [facility]  Exercises - Open Book Chest Stretch on Towel Roll  - 1 x daily - 2 sets - 10 reps - Sidelying Open Book Thoracic Lumbar Rotation and Extension  - 1 x daily - 2 sets - 10 reps - Seated Finger Flicks with Elbow Extension  - 1 x daily - 2 sets -  10 reps - Step Sideways with Arms Reaching  - 1 x daily - 2 sets - 10 reps - Standing Reach to Opposite Side with Weight Shift  - 1 x daily - 2 sets - 10 reps  GOALS: Goals reviewed with patient? Yes  SHORT TERM GOALS: Target date: 01/31/23  Pt will be Independent with PD specific HEP. Baseline: Goal status:  MET - 01/30/23  2.  Pt will demonstrate improved fine motor coordination for ADLs as evidenced by decreasing 9 hole peg test score for RUE by 5 secs Baseline: Right: 38.34 sec; Left: 32.40 sec Goal status: IN PROGRESS - 36.75 sec on 01/30/23  3.  Pt will verbalize understanding of ways to prevent future PD related complications and PD community resources. Baseline: interest in exercise recommendations, however decreased interest in group exercise programs Goal status: IN PROGRESS   LONG TERM GOALS: Target date: 02/28/23  Pt will demonstrate improved ease with fastening buttons as evidenced by decreasing 3 button/ unbutton time to <35 seconds. Baseline: 3 buttons: 48.22 sec Goal status: IN PROGRESS  2.  Pt will demonstrate improved grip strength by #5 on RUE to allow for increased ease with LB dressing and opening containers. Baseline: R: 46# and L: 62#; Goal status: IN PROGRESS  3.  Pt will write a 10-15 item grocery list with 75% legibility and no significant decrease in letter size Baseline: 50% legibility, min-mod micrographia Goal status: IN PROGRESS  4.  Pt will verbalize and/or demonstrate improved ease with getting out of bed, particularly pulling covers on/off. Baseline:  Goal status: IN PROGRESS  ASSESSMENT:  CLINICAL IMPRESSION: Pt demonstrating mild improvements with focus on full contact of R hand on cup when grasping to reduce tremors as well as modifying grip pressure on cup to keep movements more active in nature.  Pt with no increase in sizing or legibility when trialing weighted pen, however with improvements with pen with pen grip.  Pt continues to benefit  from cues for amplitude during gross and fine motor tasks.   PERFORMANCE DEFICITS: in functional skills including ADLs, IADLs, coordination, tone, ROM, strength, flexibility, Fine motor control, Gross motor control, mobility, body mechanics, endurance, decreased knowledge of precautions, decreased knowledge of use of DME, and UE functional use and psychosocial skills including coping strategies and routines and behaviors.   IMPAIRMENTS: are limiting patient from ADLs, IADLs, and play.      PLAN:  OT FREQUENCY: 1-2x/week  OT DURATION: 6 weeks (asking for 8 to accommodate scheduling)  PLANNED INTERVENTIONS: 16109 OT Re-evaluation, 97535 self care/ADL training, 60454 therapeutic exercise, 97530 therapeutic activity, 97112 neuromuscular re-education, 97140 manual therapy, 97035 ultrasound, 97010 moist heat, 97010 cryotherapy, passive range of motion, functional mobility training, psychosocial skills training, energy conservation, coping strategies training, patient/family education, and DME and/or AE instructions  RECOMMENDED OTHER SERVICES: NA  CONSULTED AND AGREED WITH PLAN OF CARE: Patient  PLAN FOR NEXT SESSION: Add to large amplitude HEP, coordination and strengthening HEP; review purposeful movements and grasp with carrying items, self-feeding, and handwriting.   Rosalio Loud, OTR/L 01/30/2023, 8:48 AM   [facility] at [facility] [address], [address] [place], [place] [address] Phone # [phone] Fax # [phone]

## 2023-02-05 ENCOUNTER — Encounter: Payer: Self-pay | Admitting: Family Medicine

## 2023-02-05 ENCOUNTER — Ambulatory Visit (INDEPENDENT_AMBULATORY_CARE_PROVIDER_SITE_OTHER): Payer: PPO | Admitting: Family Medicine

## 2023-02-05 VITALS — BP 140/70 | HR 64 | Temp 97.7°F | Ht 71.26 in | Wt 180.9 lb

## 2023-02-05 DIAGNOSIS — Z0000 Encounter for general adult medical examination without abnormal findings: Secondary | ICD-10-CM | POA: Diagnosis not present

## 2023-02-05 DIAGNOSIS — E785 Hyperlipidemia, unspecified: Secondary | ICD-10-CM | POA: Diagnosis not present

## 2023-02-05 DIAGNOSIS — I10 Essential (primary) hypertension: Secondary | ICD-10-CM

## 2023-02-05 LAB — LIPID PANEL
Cholesterol: 171 mg/dL (ref 0–200)
HDL: 51.3 mg/dL (ref >39.00)
LDL Cholesterol: 90 mg/dL (ref 0–99)
NonHDL: 120.09
Total CHOL/HDL Ratio: 3
Triglycerides: 151 mg/dL — ABNORMAL HIGH (ref 0.0–149.0)
VLDL: 30.2 mg/dL (ref 0.0–40.0)

## 2023-02-05 LAB — BASIC METABOLIC PANEL
BUN: 13 mg/dL (ref 6–23)
CO2: 29 meq/L (ref 19–32)
Calcium: 10 mg/dL (ref 8.4–10.5)
Chloride: 102 meq/L (ref 96–112)
Creatinine, Ser: 0.99 mg/dL (ref 0.40–1.50)
GFR: 74.16 mL/min (ref >60.00)
Glucose, Bld: 83 mg/dL (ref 70–99)
Potassium: 4.4 meq/L (ref 3.5–5.1)
Sodium: 138 meq/L (ref 135–145)

## 2023-02-05 LAB — HEPATIC FUNCTION PANEL
ALT: 3 U/L (ref 0–53)
AST: 17 U/L (ref 0–37)
Albumin: 4.4 g/dL (ref 3.5–5.2)
Alkaline Phosphatase: 56 U/L (ref 39–117)
Bilirubin, Direct: 0.1 mg/dL (ref 0.0–0.3)
Total Bilirubin: 0.4 mg/dL (ref 0.2–1.2)
Total Protein: 7.5 g/dL (ref 6.0–8.3)

## 2023-02-05 MED ORDER — LISINOPRIL 20 MG PO TABS: 20.0000 mg | ORAL_TABLET | Freq: Every day | ORAL | 3 refills | Status: AC

## 2023-02-05 NOTE — Patient Instructions (Signed)
 Consider RSV vaccine

## 2023-02-05 NOTE — Progress Notes (Signed)
Established Patient Office Visit  Subjective   Patient ID: Bryce Wood, male    DOB: Dec 30, 1945  Age: 78 y.o. MRN: [id number]  Chief Complaint  Patient presents with   Annual Exam    HPI    Bryce Wood is seen for physical exam.  He has history of hypertension, Parkinson disease, osteoarthritis involving multiple joints, hyperlipidemia.  Followed by neurology.  Maintained on Sinemet.  He states his tremor has worsened during the past year.  Currently getting some OT and PT.  Still works at [employer].  He has been trying to exercise more diligently.  Goes to the gym few times per week and also does some exercise bike at home. Remains on lisinopril 20 mg daily for hypertension.  Does have occasional dizziness with standing but not consistently.  Health maintenance reviewed:  Health Maintenance  Topic Date Due   Zoster Vaccines- Shingrix (1 of 2) Never done   Medicare Annual Wellness (AWV)  01/01/2022   COVID-19 Vaccine (6 - 2024-25 season) 09/15/2022   DTaP/Tdap/Td (4 - Td or Tdap) 02/23/2027   Pneumonia Vaccine 90+ Years old  Completed   INFLUENZA VACCINE  Completed   Hepatitis C Screening  Completed   HPV VACCINES  Aged Out   Fecal DNA (Cologuard)  Discontinued   -Has not had RSV vaccine -Last Cologuard was 12-22  Family history and social history reviewed with no significant changes:  Family History  Problem Relation Age of Onset   Heart disease Mother    Heart attack Mother    Hypertension Father    Stroke Father    Leukemia Father    Healthy Daughter    Cancer Paternal Uncle 36       prostate cancer   Hypertension Brother    Social History   Socioeconomic History   Marital status: Married    Spouse name: Not on file   Number of children: 1   Years of education: Not on file   Highest education level: Some college, no degree  Occupational History   Occupation: retired    Comment: Psychologist, occupational  Tobacco Use   Smoking status: Never    Smokeless tobacco: Never  Vaping Use   Vaping status: Never Used  Substance and Sexual Activity   Alcohol use: No   Drug use: No   Sexual activity: Not on file  Other Topics Concern   Not on file  Social History Narrative   RIGHT HANDED   ONE STORY    LIVES WITH SPOUSE   Social Drivers of Health   Financial Resource Strain: Low Risk  (01/01/2021)   Overall Financial Resource Strain (CARDIA)    Difficulty of Paying Living Expenses: Not hard at all  Food Insecurity: No Food Insecurity (01/01/2021)   Hunger Vital Sign    Worried About Running Out of Food in the Last Year: Never true    Ran Out of Food in the Last Year: Never true  Transportation Needs: No Transportation Needs (01/01/2021)   PRAPARE - Administrator, Civil Service (Medical): No    Lack of Transportation (Non-Medical): No  Physical Activity: Sufficiently Active (01/01/2021)   Exercise Vital Sign    Days of Exercise per Week: 5 days    Minutes of Exercise per Session: 150+ min  Stress: No Stress Concern Present (01/01/2021)   [employer] of Occupational Health - Occupational Stress Questionnaire    Feeling of Stress : Not at all  Social Connections: Socially Integrated (12/21/2019)  Social Connection and Isolation Panel [NHANES]    Frequency of Communication with Friends and Family: More than three times a week    Frequency of Social Gatherings with Friends and Family: More than three times a week    Attends Religious Services: More than 4 times per year    Active Member of Clubs or Organizations: Yes    Attends Banker Meetings: More than 4 times per year    Marital Status: Married   Past Medical History:  Diagnosis Date   ARTHRITIS 08/01/2008   Cancer (HCC) 01/2019   basal cell removed from face   HYPERLIPIDEMIA 11/21/2008   HYPERTENSION 08/01/2008   OSTEOARTHRITIS 08/01/2008   Parkinson's disease (HCC)    Past Surgical History:  Procedure Laterality Date   CIRCUMCISION N/A  04/22/2019   Procedure: CIRCUMCISION ADULT;  Surgeon: Bjorn Pippin, MD;  Location: [facility];  Service: Urology;  Laterality: N/A;   circumcison     HERNIA REPAIR  1979   inguinal   SPINE SURGERY  1999   disk l4 to l5    reports that he has never smoked. He has never used smokeless tobacco. He reports that he does not drink alcohol and does not use drugs. family history includes Cancer (age of onset: 36) in his paternal uncle; Healthy in his daughter; Heart attack in his mother; Heart disease in his mother; Hypertension in his brother and father; Leukemia in his father; Stroke in his father. No Known Allergies  Review of Systems  Constitutional:  Negative for malaise/fatigue.  Eyes:  Negative for blurred vision.  Respiratory:  Negative for shortness of breath.   Cardiovascular:  Negative for chest pain.  Gastrointestinal:  Negative for abdominal pain.  Genitourinary:  Negative for dysuria.  Neurological:  Positive for tremors. Negative for dizziness, focal weakness, weakness and headaches.      Objective:     BP (!) 140/70 (BP Location: Left Arm, Patient Position: Sitting, Cuff Size: Normal)   Pulse 64   Temp 97.7 F (36.5 C) (Oral)   Ht 5' 11.26" (1.81 m)   Wt 180 lb 14.4 oz (82.1 kg)   SpO2 98%   BMI 25.05 kg/m  BP Readings from Last 3 Encounters:  02/05/23 (!) 140/70  12/09/22 138/70  06/03/22 128/70   Wt Readings from Last 3 Encounters:  02/05/23 180 lb 14.4 oz (82.1 kg)  12/09/22 186 lb 12.8 oz (84.7 kg)  06/03/22 182 lb (82.6 kg)      Physical Exam Vitals reviewed.  Constitutional:      Appearance: He is well-developed.  HENT:     Head:     Comments: Does have some cerumen right canal but nonobstructing    Right Ear: External ear normal.     Left Ear: External ear normal.  Eyes:     Pupils: Pupils are equal, round, and reactive to light.  Neck:     Thyroid: No thyromegaly.  Cardiovascular:     Rate and Rhythm: Normal rate and regular  rhythm.  Pulmonary:     Effort: Pulmonary effort is normal. No respiratory distress.     Breath sounds: Normal breath sounds. No wheezing or rales.  Musculoskeletal:     Cervical back: Neck supple.  Skin:    Comments: Benign-appearing acrochordon lower lumbar area.  No other significant skin lesions noted.  Neurological:     General: No focal deficit present.     Mental Status: He is alert and oriented to person, place,  and time.     Comments: He has significant tremor consistent with his Parkinson's disease.      No results found for any visits on 02/05/23.  Last CBC Lab Results  Component Value Date   WBC 4.7 01/03/2021   HGB 13.9 01/03/2021   HCT 42.2 01/03/2021   MCV 93.8 01/03/2021   RDW 13.1 01/03/2021   PLT 251.0 01/03/2021   Last metabolic panel Lab Results  Component Value Date   GLUCOSE 88 01/04/2022   NA 140 01/04/2022   K 4.2 01/04/2022   CL 103 01/04/2022   CO2 28 01/04/2022   BUN 20 01/04/2022   CREATININE 1.08 01/04/2022   GFR 67.31 01/04/2022   CALCIUM 9.8 01/04/2022   PROT 7.1 01/04/2022   ALBUMIN 4.4 01/04/2022   BILITOT 0.5 01/04/2022   ALKPHOS 54 01/04/2022   AST 18 01/04/2022   ALT 4 01/04/2022   Last lipids Lab Results  Component Value Date   CHOL 205 (H) 01/04/2022   HDL 51.70 01/04/2022   LDLCALC 128 (H) 01/04/2022   LDLDIRECT 111.0 11/19/2017   TRIG 124.0 01/04/2022   CHOLHDL 4 01/04/2022      The 16-XWRU ASCVD risk score (Arnett DK, et al., 2019) is: 34.2%    Assessment & Plan:   #1 physical exam.  Discussed vaccinations.  He is up-to-date with exception of RSV vaccine.  We have suggested that he consider RSV given his age of 89.  He will consider. -Continue annual flu vaccine -Aged out of further colon cancer screening -Pneumonia vaccines complete  #2 hypertension stable.  Refill lisinopril for 1 year.  Check basic metabolic panel  #3 history of mild hyperlipidemia.  Patient would like to get repeat lipid panel.   Continue low saturated fat diet   No follow-ups on file.    Evelena Peat, MD

## 2023-02-05 NOTE — Therapy (Signed)
OUTPATIENT PHYSICAL THERAPY NEURO TREATMENT   Patient Name: Bryce Wood MRN: [id number] DOB:12/08/1945, 78 y.o., male Today's Date: 02/06/2023   PCP: Kristian Covey, MD REFERRING PROVIDER: Vladimir Faster, DO  END OF SESSION:  PT End of Session - 02/06/23 0926     Visit Number 5    Number of Visits 13    Date for PT Re-Evaluation 02/13/23    Authorization Type [employer]    PT Start Time 0847    PT Stop Time 0926    PT Time Calculation (min) 39 min    Equipment Utilized During Treatment Gait belt    Activity Tolerance Patient tolerated treatment well    Behavior During Therapy [facility] for tasks assessed/performed               Past Medical History:  Diagnosis Date   ARTHRITIS 08/01/2008   Cancer (HCC) 01/2019   basal cell removed from face   HYPERLIPIDEMIA 11/21/2008   HYPERTENSION 08/01/2008   OSTEOARTHRITIS 08/01/2008   Parkinson's disease (HCC)    Past Surgical History:  Procedure Laterality Date   CIRCUMCISION N/A 04/22/2019   Procedure: CIRCUMCISION ADULT;  Surgeon: Bjorn Pippin, MD;  Location: [facility];  Service: Urology;  Laterality: N/A;   circumcison     HERNIA REPAIR  1979   inguinal   SPINE SURGERY  1999   disk l4 to l5   Patient Active Problem List   Diagnosis Date Noted   Right shoulder pain 02/13/2022   Degenerative arthritis of right knee 06/11/2017   Parkinson's disease (HCC) 10/03/2016   Arthritis of left shoulder region 11/28/2014   Pes planus of both feet 01/04/2014   Localized osteoarthrosis, lower leg 10/11/2013   Chronic meniscal tear of knee 10/11/2013   Hyperlipidemia 11/21/2008   Essential hypertension 08/01/2008   Osteoarthritis 08/01/2008   Arthropathy 08/01/2008    ONSET DATE: 2018  REFERRING DIAG: G20.B1 (ICD-10-CM) - Parkinson's disease with dyskinesia without fluctuating manifestations (HCC)  THERAPY DIAG:  Unsteadiness on feet  Other symptoms and signs involving the nervous  system  Muscle weakness (generalized)  Other abnormalities of gait and mobility  Difficulty in walking, not elsewhere classified  Rationale for Evaluation and Treatment: Rehabilitation  SUBJECTIVE:                                                                                                                                                                                             SUBJECTIVE STATEMENT: No new concerns.   Pt accompanied by: self  PERTINENT HISTORY:  From Dr. Don Perking exam: Tone: There is mild increased tone in the RUE  Abnormal movements: There is R>LUE rest tremor;  there is minimal dyskinesia on the L shoulder Coordination:  There is mild decremation with hand opening and closing on the L and finger taps on the R.  All other RAMs are nl Gait and Station: The patient has no difficulty arising out of a deep-seated chair without the use of the hands. The patient is wide based and flexed at knees. Mildly shuffle and drags R leg a little.  He is mildly unstable.  PAIN:  Are you having pain? No  PRECAUTIONS: None  RED FLAGS: None   WEIGHT BEARING RESTRICTIONS: No  FALLS: Has patient fallen in last 6 months? No  LIVING ENVIRONMENT: Lives with: lives with their spouse Lives in: House/apartment Stairs: Yes: External: 1 steps; none Has following equipment at home: None  PLOF: Independent  PATIENT GOALS: would like to work on balance, strength  OBJECTIVE:     TODAY'S TREATMENT: 02/06/23 Activity Comments  recumbent bike HIIT 2 min warm up L1 30 sec L6 30 sec L5 1 min L2 1 min L6 30 sec L4 1 min L2 30 sec fast L6 2 min cooldown L1 Maintaining 40-59 RPM. Total of 9 minutes   Patient reports RPE of 8/10  review of standing PWR moves up rock twist step Performed in front of mirror for feedback; cues for active hands, distinct start and end to motions   standing PWR FLOW 4x Able to follow at slow pace, more difficult to follow at faster pace    standing on foam EC Tendency for retropulsion; improved after practice wt shifting forward   Standing ant/pos wt shift on foam Focus on ant wt shift  backwards walking  Cues on wider BOS, directing gaze forward instead of down  alt backwards step + ball toss Performed with LE movement only first, then added ball toss. Cues to correct crouched posture     HOME EXERCISE PROGRAM: Added standing PWR! Moves, 10-20 reps, once/day Added in standing PWR! FLOW 4x   Access Code: [email or web address] URL: [email or web address] Date: 02/06/2023 Prepared by: [facility] Outpatient  Rehab - [facility]  Exercises - Backward Walking with Counter Support  - 1 x daily - 5 x weekly - 2 sets - 5 reps   PATIENT EDUCATION: Education details: edu on neural priming, HEP update  Person educated: Patient Education method: Explanation, Demonstration, Tactile cues, Verbal cues, and Handouts Education comprehension: verbalized understanding and returned demonstration    ----------------------------------------------------------------------- Note: Objective measures were completed at Evaluation unless otherwise noted.  DIAGNOSTIC FINDINGS: no imaging for current episode  COGNITION: Overall cognitive status: Within functional limits for tasks assessed   SENSATION: Not tested  COORDINATION: Decreased rapid alternating movements initially, improved with repetition  EDEMA:  none  MUSCLE TONE: more appreciable tone to left hamstrings and right quad  MUSCLE LENGTH: Hamstrings: Right 0 deg; Left -5 deg   DTRs:  NT  POSTURE: No Significant postural limitations  LOWER EXTREMITY ROM:     Active  Right Eval Left Eval  Hip flexion    Hip extension    Hip abduction    Hip adduction    Hip internal rotation    Hip external rotation    Knee flexion    Knee extension    Ankle dorsiflexion 15 15  Ankle plantarflexion    Ankle inversion    Ankle eversion     (Blank rows = not  tested)  LOWER EXTREMITY MMT:    MMT Right Eval Left Eval  Hip flexion    Hip extension    Hip abduction    Hip adduction    Hip internal rotation    Hip external rotation    Knee flexion 5 5  Knee extension 5 5  Ankle dorsiflexion 5 5  Ankle plantarflexion    Ankle inversion    Ankle eversion    (Blank rows = not tested)  BED MOBILITY:  Indep  TRANSFERS: Assistive device utilized: None  Sit to stand: Complete Independence Stand to sit: Complete Independence Chair to chair: Complete Independence Floor:  NT    CURB:  Level of Assistance: Complete Independence Assistive device utilized: None Curb Comments:   STAIRS: NT  GAIT: Gait pattern:  somewhat unusual gait patter, difficult to characterize RLE>LLE Distance walked:  Assistive device utilized: None Level of assistance: Complete Independence Comments:   FUNCTIONAL TESTS:  5 times sit to stand: 16 sec Mini-BESTest:  18/28 3 meter backward walk: 9.75 sec = 0.99 ft/sec = 0.3 m/s (A time of 10.31 s was found to best discriminate fallers from non-fallers with PD.) 10 meter walk test: 10.63 sec = 3.0 ft/sec M-CTSIB: condition 1: normal. Condition 2: mild-mod. Condition 3: mild. Condition 4 mod-severe   TODAY'S TREATMENT:                                                                                                                              DATE: 01/02/23    PATIENT EDUCATION: Education details: assessment details, rationale of PT intervention, explanation of HIIT training for PD Person educated: Patient Education method: Explanation Education comprehension: verbalized understanding  HOME EXERCISE PROGRAM: TBD  GOALS: Goals reviewed with patient? Yes  SHORT TERM GOALS: Target date: 01/23/2023    Patient will be independent in HEP to improve functional outcomes Baseline: Goal status: MET  2.  Demo improved balance (lower risk for falls) and BLE strength per time 13 sec 5xSTS  test Baseline: 16 sec; 12 sec Goal status: MET    LONG TERM GOALS: Target date: 02/13/2023    Demo improved motor control and reduced risk for falls per score 24/28 Mini-BESTest to improve safety with community/golf course mobility Baseline: 18/28 Goal status: INITIAL  2.  Demo improved balance and coordination with reduced risk for falls per time of 7.5 sec 3 Meter Backwards Walk Test Baseline: 9.75 sec Goal status: INITIAL  3.  Report/demo teach-back of relevant exercise strategies and interventions relative to treatment of Parkinson's Disease Baseline:  Goal status: INITIAL   ASSESSMENT:  CLINICAL IMPRESSION: Patient arrived to session without complaints. HIIT warmup on recumbent bike was tolerated well and with good effort; able to achieve an RPE of 8/10. Reviewed standing PWR moves, then proceeded to PWR flow for increased coordination challenge, which was more challenging for patient. Patient perform multisensory balance tasks and backwards stepping activities to address tendency for retropulsion. No complaints at end of session.    OBJECTIVE IMPAIRMENTS: Abnormal gait, decreased activity tolerance,  decreased balance, decreased coordination, decreased endurance, decreased knowledge of use of DME, difficulty walking, decreased strength, and impaired tone.   ACTIVITY LIMITATIONS: carrying, bending, squatting, and locomotion level  PARTICIPATION LIMITATIONS: cleaning, laundry, community activity, yard work, and golf  PERSONAL FACTORS: Age, Time since onset of injury/illness/exacerbation, and 1 comorbidity: hx of back and RLE injury  are also affecting patient's functional outcome.   REHAB POTENTIAL: Excellent  CLINICAL DECISION MAKING: Evolving/moderate complexity  EVALUATION COMPLEXITY: Moderate  PLAN:  PT FREQUENCY: 1-2x/week  PT DURATION: 6 weeks  PLANNED INTERVENTIONS: 97110-Therapeutic exercises, 97530- Therapeutic activity, [id number]- Neuromuscular re-education,  97535- Self Care, [id number]- Manual therapy, [phone]- Gait training, [phone]- Aquatic Therapy, Patient/Family education, Balance training, Stair training, Vestibular training, and DME instructions  PLAN FOR NEXT SESSION: review PWR! FLOW; corner balance for multisensory/postural stability, cycling HIIT (referenced [facility] protocol), retrowalking     Baldemar Friday, PT, DPT 02/06/23 9:27 AM  [facility] at [facility] [address] [place], [address] [place], [place] [address] Phone # [phone] Fax # [phone]

## 2023-02-06 ENCOUNTER — Ambulatory Visit: Payer: PPO | Admitting: Physical Therapy

## 2023-02-06 ENCOUNTER — Encounter: Payer: Self-pay | Admitting: Physical Therapy

## 2023-02-06 ENCOUNTER — Ambulatory Visit: Payer: PPO | Admitting: Occupational Therapy

## 2023-02-06 DIAGNOSIS — R29818 Other symptoms and signs involving the nervous system: Secondary | ICD-10-CM

## 2023-02-06 DIAGNOSIS — R2689 Other abnormalities of gait and mobility: Secondary | ICD-10-CM

## 2023-02-06 DIAGNOSIS — R262 Difficulty in walking, not elsewhere classified: Secondary | ICD-10-CM

## 2023-02-06 DIAGNOSIS — R2681 Unsteadiness on feet: Secondary | ICD-10-CM

## 2023-02-06 DIAGNOSIS — R278 Other lack of coordination: Secondary | ICD-10-CM

## 2023-02-06 DIAGNOSIS — M6281 Muscle weakness (generalized): Secondary | ICD-10-CM

## 2023-02-06 NOTE — Patient Instructions (Addendum)
Bag Exercises:  Small trash bag or produce bag works best.  For all exercises, sit with big posture (sit up tall with head up) and use big movements. Perform the following exercises 1 times per day.  Hold bag in one hand. Stretch both arms/hands out to the side as big as you can. Then, pass bag from one hand to the other IN FRONT of you. Stretch arms back out big after each pass. Repeat 10 times. Hold bag in one hand. Stretch both arms/hands out to the side as big as you can. Then, pass bag from one hand to the other BEHIND you. Stretch arms back out big after each pass. Repeat 10 times. Hold bag in one hand. Stretch both arms/hands out to the side in a diagonal as big as you can. Then, pass bag from one hand to the other IN FRONT of you in a diagonal pattern. Stretch arms back out big after each pass. Repeat 10 times. Hold bag in both hands in front of you with hands/arms shoulder length apart. Move bag behind your head. Repeat 10 times. Hold bag in both hands in front of you with hands/arms shoulder length apart. Lift leg and move bag completely under each foot and back. Repeat 10 times on each side. Hold bag in right hand. Move right hand to reach behind shoulder. Then, reach behind back with left hand to pass bag from right hand to left hand. Switch sides. Repeat 10 times on each side. Hold end of bag in one hand. Stretch fingers out big to draw the entire bag into your palm. Repeat 5 times with each hand.

## 2023-02-06 NOTE — Therapy (Addendum)
 OUTPATIENT OCCUPATIONAL THERAPY PARKINSON'S  Treatment Note  Patient Name: Bryce Wood MRN: [id number] DOB:[birth year], 78 y.o., male Today's Date: 02/06/2023  PCP: Kristian Covey, MD REFERRING PROVIDER: Vladimir Faster, DO  END OF SESSION:  OT End of Session - 02/06/23 0848     Visit Number 5    Number of Visits 8    Date for OT Re-Evaluation 02/28/23   asking 8 weeks to accommodate scheduling   Authorization Type [employer]    OT Start Time 0802    OT Stop Time 0846    OT Time Calculation (min) 44 min    Activity Tolerance Patient tolerated treatment well    Behavior During Therapy [facility] for tasks assessed/performed                 Past Medical History:  Diagnosis Date   ARTHRITIS 08/01/2008   Cancer (HCC) 01/2019   basal cell removed from face   HYPERLIPIDEMIA 11/21/2008   HYPERTENSION 08/01/2008   OSTEOARTHRITIS 08/01/2008   Parkinson's disease (HCC)    Past Surgical History:  Procedure Laterality Date   CIRCUMCISION N/A 04/22/2019   Procedure: CIRCUMCISION ADULT;  Surgeon: Bjorn Pippin, MD;  Location: [facility];  Service: Urology;  Laterality: N/A;   circumcison     HERNIA REPAIR  1979   inguinal   SPINE SURGERY  1999   disk l4 to l5   Patient Active Problem List   Diagnosis Date Noted   Right shoulder pain 02/13/2022   Degenerative arthritis of right knee 06/11/2017   Parkinson's disease (HCC) 10/03/2016   Arthritis of left shoulder region 11/28/2014   Pes planus of both feet 01/04/2014   Localized osteoarthrosis, lower leg 10/11/2013   Chronic meniscal tear of knee 10/11/2013   Hyperlipidemia 11/21/2008   Essential hypertension 08/01/2008   Osteoarthritis 08/01/2008   Arthropathy 08/01/2008    ONSET DATE: Diagnosed in 2017 (referral date 12/09/22)  REFERRING DIAG: G20.B1 (ICD-10-CM) - Parkinson's disease with dyskinesia without fluctuating manifestations  THERAPY DIAG:  Other symptoms and signs involving the nervous  system  Muscle weakness (generalized)  Other lack of coordination  Rationale for Evaluation and Treatment: Rehabilitation  SUBJECTIVE:   SUBJECTIVE STATEMENT: Pt reports constipation has cleared up and no longer having issues about his tooth extraction.  Pt states that his dentist recommended a water flosser to aid in ease with flossing due to PD. Pt accompanied by: self  PERTINENT HISTORY: HTN, Parkinson's disease, arthritis, HLD  PRECAUTIONS: Fall  WEIGHT BEARING RESTRICTIONS: No  PAIN:  Are you having pain? No  FALLS: Has patient fallen in last 6 months? 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  HAND DOMINANCE: Right  ADLs: Overall ADLs: Mod I Transfers/ambulation related to ADLs: Mod I Eating: increased spilling when eating cereal in the mornings Grooming: Mod I UB Dressing: Mod I LB Dressing: occasional difficulty and increased time with pulling up certain pants and fastening buttons Toileting: Mod I Bathing: Mod I Tub Shower transfers: Mod I  Equipment: Transfer tub bench, Grab bars, and Walk in shower  IADLs: Shopping: Mod I Light housekeeping: Mod I - reports doing the vacuuming and dusting, as well as yard work Meal Prep: does not cook, wife does the cooking Community mobility: still driving Medication management: Mod I Financial management: Mod I Handwriting: 50% legible; PPT #1 ([place]) in 19.84 sec,  mild-mod micrographia  MOBILITY STATUS: Mildly shuffle and drags R leg a little.  He is mildly unstable.   POSTURE COMMENTS:  No Significant postural limitations  ACTIVITY TOLERANCE: Activity tolerance: WFL for tasks assessed  FUNCTIONAL OUTCOME MEASURES: Fastening/unfastening 3 buttons: 48.22 sec Physical performance test: PPT#2 (simulated eating) 14.66 sec & PPT#4 (donning/doffing jacket): 20.12 sec  COORDINATION: 9 Hole Peg test: Right: 38.34 sec; Left: 32.40 sec Box and Blocks:  Right 47 blocks, Left 51 blocks Tremors: Resting, action, and Right  UE ROM:   RUE: 114*, LUE: 127* shoulder flexion, approx -10* elbow extension bilaterally  UE MMT:    Grossly 4/5 bilaterally   Grip: R: 46# and L: 62#; lateral pinch: R: 13#, L: 19#; 3 point pinch: R: 12#, L: 16#  SENSATION: WFL  MUSCLE TONE: RUE: Mild and Hypertonic  COGNITION: Overall cognitive status: Within functional limits for tasks assessed  OBSERVATIONS: Bradykinesia; Resting and action tremor RUE > LUE   TODAY'S TREATMENT:                                                                                                                              DATE:  02/06/23 Bag exercises: Simulated ADLs for the following activities (using a scarf/plastic bag): donning shirt, drying back, pulling down shirt, pulling up socks and donning pants with focus on large amplitude movements.  Pt with most difficulty with simulated LB dressing and scrunching into hand as needed for donning socks.   LB dressing: engaged in discussion about sitting on lower surface and/or use of step stool to aid in donning socks. Pt reports typically putting socks on while sitting on edge of bed which is a bit taller and has increased difficulty when donning R sock. Bed mobility: educated on continued engagement in large amplitude movements when getting covers off or pulling on covers.  Reiterated intentional movements when rolling to get out of bed. Buttons:1:23  to button/unbutton 3 buttons.  Pt continues to be limited by tremors with [facility] tasks, plan to further address at next session.    01/30/23 Large amplitude: engaged in large amplitude UE with full arm extension with elbow and wrist extension and fingers open in sitting. Progressing to standing with reaching outside BOS and then across midline with weight shift.  OT utilizing vertical dowels for targets to facilitate consistent reach.  OT educated on completion of tasks at home and providing self with target to facilitate carryover of amplitude. Coordination: engaged in picking up coins and placing into coin slot with focus on intentional movements when picking up and  placing into coin slot.  Pt demonstrating increased tremor in R hand while holding cup, therefore OT educating on increased full hand grasp on container and modifying grip with on/off pressure to keep movement more active to decrease tremor. 9 hole peg test: RUE: Trial 1 at 42.69 sec and Trial 2 at 30.78 sec Handwriting: Trialed variety of pens with focus on size and weight of pens to increase legibility.  OT provided suggestions with focus on printing and use of built up handle/pen grip. Pt continues to demonstrate min-mod micrographia and decreased legibility as he fatigued.    01/23/23 Shoulder exercises: Engaged in supine and sidelying exercises with open book stretch with BUE.  OT providing initial support in supine to facilitate increased opening and improved positioning.  In sidelying, OT providing intermittent demonstration and cues for amplitude.  Completed 2 sets of 10 each exercise with focus on large amplitude and intentional movements.   Coordination: engaged in small peg board pattern replication with focus on intentional movements when picking up and manipulating small pegs to place into peg board.  Completed with RUE and LUE with mild tremor noted on R when placing pegs into pegboard, increasing with fatigue.  OT educating on  taking a break and use of finger flicks to focus on opening hands when recognizing movements as getting smaller.  OT challenged pt to remove pegs from pegboard one at a time and translate to palm to hold up to 10 pegs at a time.  Pt dropping pegs ~40% of time but demonstrating good attention to task when removing pegs from pegboard. Cards: engaged in flipping cards with picking up one at a time off a deck with focus on large amplitude forearm rotation and hand opening. OT providing cues to attend to forearm rotation, as pt with decreased supination during flipping and placing of cards.     PATIENT EDUCATION: Education details: ongoing condition specific education Person educated: Patient Education method: Explanation Education comprehension: verbalized understanding and needs further education  HOME EXERCISE PROGRAM: 01/07/23: Large amplitude and coordination HEP (see pt instructions)  Access Code: [email or web address] URL: [email or web address] Date: 01/30/2023 Prepared by: [facility] - Outpatient  Rehab - [facility]  Exercises - Open Book Chest Stretch on Towel Roll  - 1 x daily - 2 sets - 10 reps - Sidelying Open Book Thoracic Lumbar Rotation and Extension  - 1 x daily - 2 sets - 10 reps - Seated Finger Flicks with Elbow Extension  - 1 x daily - 2 sets - 10 reps - Step Sideways with Arms Reaching  - 1 x daily - 2 sets - 10 reps - Standing Reach to Opposite Side with Weight Shift  - 1 x daily - 2 sets - 10 reps  GOALS: Goals reviewed with patient? Yes  SHORT TERM GOALS: Target date: 01/31/23  Pt will be Independent with PD specific HEP. Baseline: Goal status: MET - 01/30/23  2.  Pt will demonstrate improved fine motor coordination for ADLs as evidenced by decreasing 9 hole peg test score for RUE by 5 secs Baseline: Right: 38.34 sec; Left: 32.40 sec Goal status: IN PROGRESS - 36.75 sec on 01/30/23  3.  Pt will verbalize understanding of ways to prevent future PD related  complications and PD community resources. Baseline: interest in exercise recommendations, however decreased interest in group exercise programs Goal status: IN PROGRESS   LONG TERM GOALS: Target date: 02/28/23  Pt will demonstrate improved ease with fastening buttons as evidenced by decreasing 3 button/  unbutton time to <35 seconds. Baseline: 3 buttons: 48.22 sec Goal status: IN PROGRESS  2.  Pt will demonstrate improved grip strength by #5 on RUE to allow for increased ease with LB dressing and opening containers. Baseline: R: 46# and L: 62#; Goal status: IN PROGRESS  3.  Pt will write a 10-15 item grocery list with 75% legibility and no significant decrease in letter size Baseline: 50% legibility, min-mod micrographia Goal status: IN PROGRESS  4.  Pt will verbalize and/or demonstrate improved ease with getting out of bed, particularly pulling covers on/off. Baseline:  Goal status: IN PROGRESS  ASSESSMENT:  CLINICAL IMPRESSION: Pt demonstrating good understanding of large amplitude, intentional movements during simulated ADLs when completing bag exercises.  Pt with most difficulty with simulated LB dressing with advancing scarf over foot and gathering scarf into hand. Pt receptive to education on modifications to setup/task to increase ease with LB dressing.  Plan to engage in [facility] and buttons next session.  Pt continues to benefit from cues for amplitude during gross and fine motor tasks.   PERFORMANCE DEFICITS: in functional skills including ADLs, IADLs, coordination, tone, ROM, strength, flexibility, Fine motor control, Gross motor control, mobility, body mechanics, endurance, decreased knowledge of precautions, decreased knowledge of use of DME, and UE functional use and psychosocial skills including coping strategies and routines and behaviors.   IMPAIRMENTS: are limiting patient from ADLs, IADLs, and play.      PLAN:  OT FREQUENCY: 1-2x/week  OT DURATION: 6 weeks (asking  for 8 to accommodate scheduling)  PLANNED INTERVENTIONS: 16109 OT Re-evaluation, 97535 self care/ADL training, 60454 therapeutic exercise, 97530 therapeutic activity, 97112 neuromuscular re-education, 97140 manual therapy, 97035 ultrasound, 97010 moist heat, 97010 cryotherapy, passive range of motion, functional mobility training, psychosocial skills training, energy conservation, coping strategies training, patient/family education, and DME and/or AE instructions  RECOMMENDED OTHER SERVICES: NA  CONSULTED AND AGREED WITH PLAN OF CARE: Patient  PLAN FOR NEXT SESSION: FMC and buttons; review purposeful movements and grasp with carrying items, self-feeding, and handwriting.   Rosalio Loud, OTR/L 02/06/2023, 8:49 AM   [facility] at [facility] [address] [place], [address] [place], [place] [address] Phone # [phone] Fax # [phone]   OCCUPATIONAL THERAPY DISCHARGE SUMMARY  Visits from Start of Care: 5  Current functional level related to goals / functional outcomes: Unsure as pt did not return after above visit.  Pt canceled next appt stating that he would call back to reschedule but did not.  Pt had met 1 STG and was progressing towards other goals at last visit.    Remaining deficits: Unsure as pt did not return after above visit.  Pt still with decreased coordination, amplitude and difficulty with aspects of dressing at last visit.    Education / Equipment: Shoulder ROM, coordination, and large amplitude HEP, adaptive techniques for dressing and handwriting   Patient agrees to discharge. Patient goals were partially met. Patient is being discharged due to not returning since the last visit.  Rosalio Loud, OT 04/11/23

## 2023-02-12 ENCOUNTER — Ambulatory Visit: Payer: PPO | Admitting: Family Medicine

## 2023-02-12 ENCOUNTER — Other Ambulatory Visit: Payer: Self-pay

## 2023-02-12 ENCOUNTER — Ambulatory Visit (INDEPENDENT_AMBULATORY_CARE_PROVIDER_SITE_OTHER): Payer: PPO

## 2023-02-12 VITALS — BP 128/82 | HR 70 | Ht 71.0 in | Wt 176.0 lb

## 2023-02-12 DIAGNOSIS — M79602 Pain in left arm: Secondary | ICD-10-CM

## 2023-02-12 DIAGNOSIS — M79622 Pain in left upper arm: Secondary | ICD-10-CM | POA: Diagnosis not present

## 2023-02-12 DIAGNOSIS — S51012A Laceration without foreign body of left elbow, initial encounter: Secondary | ICD-10-CM | POA: Diagnosis not present

## 2023-02-12 DIAGNOSIS — M19012 Primary osteoarthritis, left shoulder: Secondary | ICD-10-CM | POA: Diagnosis not present

## 2023-02-12 DIAGNOSIS — M25512 Pain in left shoulder: Secondary | ICD-10-CM | POA: Diagnosis not present

## 2023-02-12 NOTE — Patient Instructions (Addendum)
Thank you for coming in today.   Recheck as needed.   Advance activity as tolerated.   Keep the wound clean and covered with vasoline or similar.   Recheck if you have problems.

## 2023-02-12 NOTE — Progress Notes (Signed)
   Bryce Payor, PhD, LAT, ATC acting as a scribe for Clementeen Graham, MD.  Bryce Wood is a 78 y.o. male who presents to [facility] today for L shoulder/arm pain. Pt was previously seen by Dr. Katrinka Blazing on 02/13/22 for his R shoulder.  Today, pt c/o L shoulder/arm pain ongoing since yesterday. Pt suffered a fall when he was climbing up a hill to get a friend's golf ball and then slipped and fell trying to get back down the hill. He reports falling w/ his L shoulder flexed, landing on the posterior upper arm. Pt locates pain to posterior humerus, distal clavicle, and posterior elbow.   He notes hx of parkinson's.   Treatments tried: tylenol  Pertinent review of systems: No fevers or chills  Relevant historical information: Parkinson's disease.  Tetanus vaccine up-to-date last given 2019   Exam:  BP 128/82   Pulse 70   Ht 5\' 11"  (1.803 m)   Wt 176 lb (79.8 kg)   SpO2 96%   BMI 24.55 kg/m  General: Well Developed, well nourished, and in no acute distress.   MSK: Left shoulder normal-appearing normal motion intact strength negative impingement testing. Left elbow small skin tear/abrasion overlying the olecranon at posterior elbow.  Elbow motion is intact. Strength is intact. Nontender.   Lab and Radiology Results  X-ray images left shoulder and left humerus obtained today personally and independently interpreted. No acute fractures. Await formal radiology review    Assessment and Plan: 78 y.o. male with left elbow and upper arm pain after falling yesterday.  Patient did suffer an abrasion inside of his shirt on the elbow.  Otherwise he seems to be doing just fine.  He has intact strength and motion.  X-rays look okay to me.  The wound looks well.  Plan for bit of watchful waiting and resuming activity as tolerated.  We discussed some basic wound care.  Tdap is up-to-date.  Check back as needed.  Next step if needed would be physical  therapy.   PDMP not reviewed this encounter. Orders Placed This Encounter  Procedures   DG Humerus Left    Standing Status:   Future    Number of Occurrences:   1    Expiration Date:   03/14/2023    Reason for Exam (SYMPTOM  OR DIAGNOSIS REQUIRED):   left arm pain    Preferred imaging location?:   [facility]   DG Shoulder Left    Standing Status:   Future    Number of Occurrences:   1    Expiration Date:   03/14/2023    Reason for Exam (SYMPTOM  OR DIAGNOSIS REQUIRED):   left shoulder pain    Preferred imaging location?:   [facility]   [place] LIMITED JOINT SPACE STRUCTURES UP LEFT(NO LINKED CHARGES)    Reason for Exam (SYMPTOM  OR DIAGNOSIS REQUIRED):   left arm pain    Preferred imaging location?:   [facility]   No orders of the defined types were placed in this encounter.    Discussed warning signs or symptoms. Please see discharge instructions. Patient expresses understanding.   The above documentation has been reviewed and is accurate and complete Clementeen Graham, M.D.

## 2023-02-13 ENCOUNTER — Ambulatory Visit: Payer: PPO

## 2023-02-13 ENCOUNTER — Ambulatory Visit: Payer: PPO | Admitting: Occupational Therapy

## 2023-02-21 ENCOUNTER — Encounter: Payer: Self-pay | Admitting: Family Medicine

## 2023-04-21 ENCOUNTER — Encounter: Payer: Self-pay | Admitting: Family Medicine

## 2023-04-21 ENCOUNTER — Ambulatory Visit (INDEPENDENT_AMBULATORY_CARE_PROVIDER_SITE_OTHER): Admitting: Family Medicine

## 2023-04-21 VITALS — BP 140/80 | HR 59 | Temp 97.6°F | Ht 71.0 in | Wt 179.8 lb

## 2023-04-21 DIAGNOSIS — K1379 Other lesions of oral mucosa: Secondary | ICD-10-CM

## 2023-04-21 MED ORDER — TRIAMCINOLONE ACETONIDE 0.1 % MT PSTE: 1.0000 | PASTE | Freq: Two times a day (BID) | OROMUCOSAL | 0 refills | Status: DC

## 2023-04-21 NOTE — Progress Notes (Signed)
   Acute Office Visit  Subjective:     Patient ID: Bryce Wood, male    DOB: [birth year], 78 y.o.   MRN: [id number]  Chief Complaint  Patient presents with   Mouth Lesions    X1 month, states he tried salt water twice a day with no relief    HPI Patient is in today for lesions in his mouth, states he has had these before, sort of in the same spot as before, one on inside of the left cheek and one on the right. States that they usually go away on their own but this time they are not resolving. Recently had a crown placed on the left side and maybe it is rubbing but this does not explain the right side. Does not smoke or chew tobacco. Has been using warm salt water rinses which helps some but he was concerned because it has lasted so long.  Review of Systems  All other systems reviewed and are negative.       Objective:    BP (!) 140/80   Pulse (!) 59   Temp 97.6 F (36.4 C) (Oral)   Ht 5\' 11"  (1.803 m)   Wt 179 lb 12.8 oz (81.6 kg)   SpO2 99%   BMI 25.08 kg/m    Physical Exam Vitals reviewed.  Constitutional:      Appearance: Normal appearance. He is normal weight.  HENT:     Mouth/Throat:     Lips: Pink.     Mouth: Mucous membranes are moist. Oral lesions (left interior cheek-- there is some white tissue and in the center there is reddish denuded tissue, no surrounding erythema or inflammation,  <0.5 cm in diameter) present.  Neurological:     Mental Status: He is alert.     No results found for any visits on 04/21/23.      Assessment & Plan:   Problem List Items Addressed This Visit   None Visit Diagnoses       Recurrent mouth ulceration    -  Primary   Relevant Medications   triamcinolone (KENALOG) 0.1 % paste     Could be from the crown rubbing on his cheek, the right cheek side looks normal. I recommended trying triamcinolone paste BID for a few days and continuing salt water rinses, if it does not improve pt was advised to go back to his dentist to  have it looked at again.   Meds ordered this encounter  Medications   triamcinolone (KENALOG) 0.1 % paste    Sig: Use as directed 1 Application in the mouth or throat 2 (two) times daily.    Dispense:  5 g    Refill:  0    No follow-ups on file.  Karie Georges, MD

## 2023-05-08 ENCOUNTER — Telehealth: Payer: Self-pay | Admitting: *Deleted

## 2023-05-08 NOTE — Telephone Encounter (Signed)
 Copied from CRM [phone]. Topic: General - Other >> May 08, 2023 11:23 AM Allyne Areola wrote: Reason for CRM: Evalyn Hillier from [facility] is calling because patient advised he was seen with Dr.Michael on 04/21/2023 and an image was taken of his mouth. They would like to know if that can be sent to them. Fax [phone] preferred would be Email [email or web address].

## 2023-05-08 NOTE — Telephone Encounter (Signed)
 I was not able to take an image

## 2023-05-08 NOTE — Telephone Encounter (Signed)
 I called the dental office at [phone] and left a detailed message for Evalyn Hillier with the information below.

## 2023-06-04 NOTE — Progress Notes (Unsigned)
 Assessment/Plan:   1.  Parkinsons Disease  -Continue carbidopa /levodopa  25/100, 2/2/1  -they asked about vyalev.  Discussed that not currently MCR approved  -discussed surgical intervention for tremor  -Follows regularly with dermatology.  -let me know when want PT referral  2.  REM behavior disorder  -Patient does not want any medication.  Understands bedroom safety.  Seems to be doing fairly well in that regard.  3.  Intermittent lightheadedness  -Following with primary care.  It was felt that this may be due to intermittent hypoglycemia.  -Blood pressure was elevated at primary care visit.  He is on lisinopril .  4.  Syncopal episode  -occurred in church while seated in 2024.  Patient thought due to dehydration.  I am not convinced given that he was seated.  Has not recurred  5.  Insomnia  -is actually sleeping pretty well at night and just getting up early and feels well rested  -sleeping spontaneously in [place].  Discussed scheduled naps instead and not after 2pm and no longer than 1 hour  -doesn't want medication right now Subjective:   Bryce Wood was seen today in follow up for Parkinsons disease.  My previous records were reviewed prior to todays visit as well as outside records available to me.  Pt with wife who supplements hx.  Patient did have a fall since last visit.  He was golfing and going up a hill to get a friend's golf ball and he slipped and fell on ice and hurt his shoulder.  He ended up seeing Dr. Alease Hunter.  Shoulder x-rays were negative for fracture.  He has not had other falls.  He has not had syncopal episodes.  No cramping of feet/legs at night.  Wife notes pt sleepy in [place].  He golfs a lot of the [place] and comes home and easily falls asleep.  He does sleep at night until about 4:30 am but then feels well rested and gets up.  Current prescribed movement disorder medications: Carbidopa /levodopa  25/100, 2/2/1  ALLERGIES:  No Known Allergies  CURRENT MEDICATIONS:   Outpatient Encounter Medications as of 06/10/2023  Medication Sig   acetaminophen  (TYLENOL ) 650 MG CR tablet Take 650 mg by mouth 3 (three) times daily.   carbidopa -levodopa  (SINEMET  IR) 25-100 MG tablet TAKE 2 TABLETS AT 7AM, THEN TAKE 2 TABLETS AT 11AM, THEN TAKE 1 TABLET AT 4PM   cholecalciferol (VITAMIN D) 1000 UNITS tablet Take 2,000 Units by mouth daily.   fish oil-omega-3 fatty acids 1000 MG capsule Take 1 g by mouth daily.    lisinopril  (ZESTRIL ) 20 MG tablet Take 1 tablet (20 mg total) by mouth daily.   triamcinolone  (KENALOG ) 0.1 % paste Use as directed 1 Application in the mouth or throat 2 (two) times daily.   TURMERIC PO Take 1 tablet by mouth in the morning and at bedtime.    No facility-administered encounter medications on file as of 06/10/2023.    Objective:   PHYSICAL EXAMINATION:    VITALS:   Vitals:   06/10/23 0823  BP: 124/70  Pulse: (!) 58  SpO2: 96%  Weight: 180 lb 6.4 oz (81.8 kg)  Height: 6' (1.829 m)     GEN:  The patient appears stated age and is in NAD. HEENT:  Normocephalic, atraumatic.  The mucous membranes are moist. The superficial temporal arteries are without ropiness or tenderness. CV:  RRR Lungs:  CTAB Neck/HEME:  There are no carotid bruits bilaterally.  Neurological examination:  Orientation: The patient is  alert and oriented x3. Cranial nerves: There is good facial symmetry with mild facial hypomimia. The speech is fluent and clear. Soft palate rises symmetrically and there is no tongue deviation. Hearing is intact to conversational tone. Sensation: Sensation is intact to light touch throughout Motor: Strength is at least antigravity x4.  Movement examination: Tone: There is mild increased tone in the RUE Abnormal movements: There is RUE rest tremor (mod amplitude);  there is mild head dyskinesia Coordination:  There is mild decremation with hand opening and closing on the R.  All other RAMs are nl Gait and Station: The patient has  no difficulty arising out of a deep-seated chair without the use of the hands. The patient is wide based and flexed at knees. Mildly shuffle and drags R leg a little.  He is somewhat unstable.    I have reviewed and interpreted the following labs independently    Chemistry      Component Value Date/Time   NA 138 02/05/2023 0753   K 4.4 02/05/2023 0753   CL 102 02/05/2023 0753   CO2 29 02/05/2023 0753   BUN 13 02/05/2023 0753   CREATININE 0.99 02/05/2023 0753      Component Value Date/Time   CALCIUM 10.0 02/05/2023 0753   ALKPHOS 56 02/05/2023 0753   AST 17 02/05/2023 0753   ALT 3 02/05/2023 0753   BILITOT 0.4 02/05/2023 0753       Lab Results  Component Value Date   WBC 4.7 01/03/2021   HGB 13.9 01/03/2021   HCT 42.2 01/03/2021   MCV 93.8 01/03/2021   PLT 251.0 01/03/2021    Lab Results  Component Value Date   TSH 1.66 01/03/2021   Total time spent on today's visit was 30 minutes, including both face-to-face time and nonface-to-face time.  Time included that spent on review of records (prior notes available to me/labs/imaging if pertinent), discussing treatment and goals, answering patient's questions and coordinating care.    Cc:  Marquetta Sit, MD

## 2023-06-10 ENCOUNTER — Ambulatory Visit: Payer: PPO | Admitting: Neurology

## 2023-06-10 ENCOUNTER — Encounter: Payer: Self-pay | Admitting: Neurology

## 2023-06-10 VITALS — BP 124/70 | HR 58 | Ht 72.0 in | Wt 180.4 lb

## 2023-06-10 DIAGNOSIS — G20A1 Parkinson's disease without dyskinesia, without mention of fluctuations: Secondary | ICD-10-CM | POA: Diagnosis not present

## 2023-06-10 DIAGNOSIS — G4709 Other insomnia: Secondary | ICD-10-CM | POA: Diagnosis not present

## 2023-06-10 MED ORDER — CARBIDOPA-LEVODOPA 25-100 MG PO TABS: ORAL_TABLET | ORAL | 1 refills | Status: DC

## 2023-06-10 NOTE — Patient Instructions (Signed)
 We will do a Parkinsons Picnic on 6/18 from 1-4.  RSVP at [email or web address]

## 2023-07-08 ENCOUNTER — Encounter: Payer: Self-pay | Admitting: Family Medicine

## 2023-07-08 ENCOUNTER — Ambulatory Visit (INDEPENDENT_AMBULATORY_CARE_PROVIDER_SITE_OTHER): Admitting: Family Medicine

## 2023-07-08 VITALS — BP 116/60 | HR 60 | Temp 97.7°F | Wt 175.6 lb

## 2023-07-08 DIAGNOSIS — M19072 Primary osteoarthritis, left ankle and foot: Secondary | ICD-10-CM | POA: Diagnosis not present

## 2023-07-08 DIAGNOSIS — L84 Corns and callosities: Secondary | ICD-10-CM

## 2023-07-08 DIAGNOSIS — L738 Other specified follicular disorders: Secondary | ICD-10-CM

## 2023-07-08 NOTE — Patient Instructions (Signed)
 Suspect intraoral sebaceous hyperplasia which is benign

## 2023-07-08 NOTE — Progress Notes (Signed)
 Established Patient Office Visit  Subjective   Patient ID: Bryce Wood, male    DOB: Mar 04, 1945  Age: 78 y.o. MRN: [id number]  Chief Complaint  Patient presents with   Foot Pain   Mouth Lesions    HPI   Garnet has history of Parkinson's disease, hypertension, osteoarthritis involving multiple joints.  Still very active.  Played 18 holes of golf yesterday.  He seen today with mouth sores for at least 6 months.  He states that for years he has had intermittent mouth ulcers.  Back in January had 2 teeth pulled.  He started using some salt water .  Lesions did not resolve.  Was seen here April 7 and given prescription of Kenalog  paste with no change in appearance of intraoral lesions.  Went back to his dentist and was told to use salt water  again.  He had some bilateral whitish to yellowish mucosal discoloration.  No lymphadenopathy.  No appetite or weight changes.  Occasional soreness in her buccal mucosa which she thinks is due to his crown rubbing against this region.  He is considering change of dentist.  Second issue is left foot pain.  He has some deformity of the midfoot which she states have been like this for years.  Was diagnosed as arthritis previously.  Still able to stay very active.  Was advised years ago not have surgery.  Also has hard callused area left second toe.  Would like to see podiatrist.  Denies any history of trauma to the left foot  Past Medical History:  Diagnosis Date   ARTHRITIS 08/01/2008   Cancer (HCC) 01/2019   basal cell removed from face   HYPERLIPIDEMIA 11/21/2008   HYPERTENSION 08/01/2008   OSTEOARTHRITIS 08/01/2008   Parkinson's disease (HCC)    Past Surgical History:  Procedure Laterality Date   CIRCUMCISION N/A 04/22/2019   Procedure: CIRCUMCISION ADULT;  Surgeon: Watt Rush, MD;  Location: [facility];  Service: Urology;  Laterality: N/A;   circumcison     HERNIA REPAIR  1979   inguinal   SPINE SURGERY  1999   disk l4 to l5     reports that he has never smoked. He has never used smokeless tobacco. He reports that he does not drink alcohol and does not use drugs. family history includes Cancer (age of onset: 54) in his paternal uncle; Healthy in his daughter; Heart attack in his mother; Heart disease in his mother; Hypertension in his brother and father; Leukemia in his father; Stroke in his father. No Known Allergies  Review of Systems  Constitutional:  Negative for chills, fever and weight loss.  Respiratory:  Negative for shortness of breath.   Cardiovascular:  Negative for chest pain.      Objective:     BP 116/60 (BP Location: Left Arm, Patient Position: Sitting, Cuff Size: Normal)   Pulse 60   Temp 97.7 F (36.5 C) (Oral)   Wt 175 lb 9.6 oz (79.7 kg)   SpO2 97%   BMI 23.82 kg/m  BP Readings from Last 3 Encounters:  07/08/23 116/60  06/10/23 124/70  04/21/23 (!) 140/80   Wt Readings from Last 3 Encounters:  07/08/23 175 lb 9.6 oz (79.7 kg)  06/10/23 180 lb 6.4 oz (81.8 kg)  04/21/23 179 lb 12.8 oz (81.6 kg)      Physical Exam Vitals reviewed.  Constitutional:      General: He is not in acute distress.    Appearance: He is not ill-appearing.  HENT:  Mouth/Throat:     Comments: Intraoral exam reveals no visible ulcers at this time.  He has some submucosal yellowish discoloration and patch on the right and left inner buccal mucosa consistent with intraoral sebaceous hyperplasia.  No tongue lesions evident.  Cardiovascular:     Rate and Rhythm: Normal rate and regular rhythm.  Pulmonary:     Effort: Pulmonary effort is normal.     Breath sounds: Normal breath sounds.   Musculoskeletal:     Cervical back: Neck supple.     Comments: Left foot reveals hypertrophic bony changes left mid medial foot.  No ulcer.  Nontender to palpation.  Thickened hard callus distal left second toe.  No visible ulcer.  Dystrophic nail changes  Lymphadenopathy:     Cervical: No cervical adenopathy.    Neurological:     Mental Status: He is alert.      No results found for any visits on 07/08/23.    The 10-year ASCVD risk score (Arnett DK, et al., 2019) is: 24.6%    Assessment & Plan:   #1 intraoral sebaceous hyperplasia inner buccal mucosa bilaterally.  Reassured this appears benign.  We did not see any evidence for ulcer today.  He does not have any other worrisome features such as neck adenopathy or any appetite or weight changes.  #2 callus left second toe.  Patient requesting podiatry referral.  This is placed.  #3 deformity left foot.  He has hypertrophic bony changes mid foot.  Set up podiatry referral.   No follow-ups on file.    Wolm Scarlet, MD

## 2023-07-17 DIAGNOSIS — Z86007 Personal history of in-situ neoplasm of skin: Secondary | ICD-10-CM | POA: Diagnosis not present

## 2023-07-17 DIAGNOSIS — L570 Actinic keratosis: Secondary | ICD-10-CM | POA: Diagnosis not present

## 2023-07-17 DIAGNOSIS — Z08 Encounter for follow-up examination after completed treatment for malignant neoplasm: Secondary | ICD-10-CM | POA: Diagnosis not present

## 2023-07-17 DIAGNOSIS — L814 Other melanin hyperpigmentation: Secondary | ICD-10-CM | POA: Diagnosis not present

## 2023-07-17 DIAGNOSIS — D225 Melanocytic nevi of trunk: Secondary | ICD-10-CM | POA: Diagnosis not present

## 2023-07-17 DIAGNOSIS — L918 Other hypertrophic disorders of the skin: Secondary | ICD-10-CM | POA: Diagnosis not present

## 2023-07-17 DIAGNOSIS — Z85828 Personal history of other malignant neoplasm of skin: Secondary | ICD-10-CM | POA: Diagnosis not present

## 2023-07-17 DIAGNOSIS — L821 Other seborrheic keratosis: Secondary | ICD-10-CM | POA: Diagnosis not present

## 2023-07-24 ENCOUNTER — Ambulatory Visit: Admitting: Podiatry

## 2023-07-24 ENCOUNTER — Ambulatory Visit (INDEPENDENT_AMBULATORY_CARE_PROVIDER_SITE_OTHER)

## 2023-07-24 ENCOUNTER — Encounter: Payer: Self-pay | Admitting: Podiatry

## 2023-07-24 DIAGNOSIS — M2042 Other hammer toe(s) (acquired), left foot: Secondary | ICD-10-CM | POA: Diagnosis not present

## 2023-07-24 DIAGNOSIS — M19072 Primary osteoarthritis, left ankle and foot: Secondary | ICD-10-CM

## 2023-07-24 NOTE — Progress Notes (Signed)
 Chief Complaint  Patient presents with   Foot Pain    Medial foot and 2nd toe left - toe getting callused at the tip and curling slightly, medial foot-arthritis for 20 years, always trouble wearing boots when working, redness, gait changes from back surgery, numbness right leg   New Patient (Initial Visit)    HPI: 78 y.o. male presents today to discuss midfoot arthritis.  Mostly affecting the left foot.  He is been dealing with this for nearly 20 years.  He does not report significant pain with this but would like more information on his condition including possible treatment and what to expect progression.  He is also complaining of callus to the left second toe distal aspect.  He does have history of Parkinson's and presents using a walker.  Past Medical History:  Diagnosis Date   ARTHRITIS 08/01/2008   Cancer (HCC) 01/2019   basal cell removed from face   HYPERLIPIDEMIA 11/21/2008   HYPERTENSION 08/01/2008   OSTEOARTHRITIS 08/01/2008   Parkinson's disease (HCC)     Past Surgical History:  Procedure Laterality Date   CIRCUMCISION N/A 04/22/2019   Procedure: CIRCUMCISION ADULT;  Surgeon: Watt Rush, MD;  Location: [facility];  Service: Urology;  Laterality: N/A;   circumcison     HERNIA REPAIR  1979   inguinal   SPINE SURGERY  1999   disk l4 to l5    No Known Allergies  ROS    Physical Exam: There were no vitals filed for this visit.  General: The patient is alert and oriented x3 in no acute distress.  Dermatology: Pedal skin is warm and dry, atrophic.  Hyperkeratotic callus present to distal left second toe.  Nails x 10 are dystrophic.  Vascular: Palpable pedal pulses bilaterally. Capillary refill within normal limits.  Decreased pedal hair growth.  Telangiectasias noted.  Neurological: Light touch sensation grossly intact bilateral feet.   Musculoskeletal Exam: Left foot rigid pes planovalgus present.  Significant spurring dorsal midfoot to the  medial tarsometatarsal joints.  No significant pain on palpation or with range of motion.  Hammertoe contractures of lesser digits noted.  Radiographic Exam: Left foot radiographs 3 views weightbearing 07/24/2023 Normal osseous mineralization.  No fractures or acute osseous irregularities noted.  Abducted forefoot noted.  Pes planovalgus foot type present.  Severe midfoot arthritis present with periarticular spurring.  Arthritis of the [place] joint present as well.  Assessment/Plan of Care: 1. Hammertoe of second toe of left foot   2. Arthritis of left midfoot      No orders of the defined types were placed in this encounter.  None  Discussed clinical findings with patient today.  # Arthritis of left midfoot with rigid pes planovalgus - Radiographs reviewed with patient - Does not cause patient significant pain at this point - Did discuss shoe gear modification, use of custom functional versus accommodative orthotics, use of functional bracing if this does become symptomatic.  Did discuss use of oral and topical NSAIDs as well as corticosteroid injection if there is inflammation about the dorsal spurring. -Did briefly discuss surgery, do not think patient would be a very good surgical candidate due to likely difficulty for limited weightbearing. - May follow-up as needed for this if this becomes symptomatic.  # Semirigid hammertoes of lesser digits causing callus to distal second toe - Preulcerative calluses was debrided as a courtesy today using 302 scalpel without incident.  Did discuss use of callus softening creams and  lotions - Discussed use of over-the-counter padding and gel toe caps  He is requesting hours footcare due to inability to trim his nails due to his Parkinson's, he may schedule appointment as needed for this.    Riyan Haile L. Lamount MAUL, AACFAS [facility]     [address] [place], [place] [address]                 Office [phone]  Fax [phone]

## 2023-08-12 ENCOUNTER — Ambulatory Visit: Admitting: Podiatry

## 2023-08-13 ENCOUNTER — Ambulatory Visit: Admitting: Podiatry

## 2023-08-13 ENCOUNTER — Encounter: Payer: Self-pay | Admitting: Podiatry

## 2023-08-13 DIAGNOSIS — M79675 Pain in left toe(s): Secondary | ICD-10-CM

## 2023-08-13 DIAGNOSIS — M2042 Other hammer toe(s) (acquired), left foot: Secondary | ICD-10-CM

## 2023-08-13 DIAGNOSIS — M19072 Primary osteoarthritis, left ankle and foot: Secondary | ICD-10-CM

## 2023-08-13 DIAGNOSIS — M79674 Pain in right toe(s): Secondary | ICD-10-CM | POA: Diagnosis not present

## 2023-08-13 DIAGNOSIS — B351 Tinea unguium: Secondary | ICD-10-CM

## 2023-08-13 NOTE — Progress Notes (Signed)
 This patient returns to my office for at risk foot care.  This patient requires this care by a professional since this patient will be at risk due to having Parkinsons.  This patient is unable to cut nails himself since the patient cannot reach his nails.These nails are painful walking and wearing shoes.  This patient presents for at risk foot care today.  General Appearance  Alert, conversant and in no acute stress.  Vascular  Dorsalis pedis and posterior tibial  pulses are palpable  bilaterally.  Capillary return is within normal limits  bilaterally. Temperature is within normal limits  bilaterally.  Neurologic  Senn-Weinstein monofilament wire test within normal limits  bilaterally. Muscle power within normal limits bilaterally.  Nails Thick disfigured discolored nails with subungual debris  hallux nails bilaterally. No evidence of bacterial infection or drainage bilaterally.  Orthopedic  No limitations of motion  feet .  No crepitus or effusions noted. DJD 1st MCJ left foot.  Mild HAV L.  Hammer toe with arthritis PIPJ second toe left foot.  Skin  normotropic skin with no porokeratosis noted bilaterally.  No signs of infections or ulcers noted.     Onychomycosis  Pain in right toes  Pain in left toes  Consent was obtained for treatment procedures.  ROV.  Mechanical debridement of nails 1-5  bilaterally performed with a nail nipper.  Filed with dremel without incident. Discussed second toe with patient.  Gave patient toe cap.   Return office visit   3 months                   Told patient to return for periodic foot care and evaluation due to potential at risk complications.   Cordella Bold DPM

## 2023-11-18 ENCOUNTER — Encounter: Payer: Self-pay | Admitting: Podiatry

## 2023-11-18 ENCOUNTER — Ambulatory Visit: Admitting: Podiatry

## 2023-11-18 DIAGNOSIS — B351 Tinea unguium: Secondary | ICD-10-CM | POA: Diagnosis not present

## 2023-11-18 DIAGNOSIS — M79674 Pain in right toe(s): Secondary | ICD-10-CM

## 2023-11-18 DIAGNOSIS — M79675 Pain in left toe(s): Secondary | ICD-10-CM | POA: Diagnosis not present

## 2023-11-18 DIAGNOSIS — M2042 Other hammer toe(s) (acquired), left foot: Secondary | ICD-10-CM

## 2023-11-18 NOTE — Progress Notes (Signed)
 This patient returns to my office for at risk foot care.  This patient requires this care by a professional since this patient will be at risk due to having Parkinsons.  This patient is unable to cut nails himself since the patient cannot reach his nails.These nails are painful walking and wearing shoes.  This patient presents for at risk foot care today.  General Appearance  Alert, conversant and in no acute stress.  Vascular  Dorsalis pedis and posterior tibial  pulses are palpable  bilaterally.  Capillary return is within normal limits  bilaterally. Temperature is within normal limits  bilaterally.  Neurologic  Senn-Weinstein monofilament wire test within normal limits  bilaterally. Muscle power within normal limits bilaterally.  Nails Thick disfigured discolored nails with subungual debris  hallux nails bilaterally. No evidence of bacterial infection or drainage bilaterally.  Orthopedic  No limitations of motion  feet .  No crepitus or effusions noted. DJD 1st MCJ left foot.  Mild HAV L.  Hammer toe with arthritis PIPJ second toe left foot.  Skin  normotropic skin with no porokeratosis noted bilaterally.  No signs of infections or ulcers noted.     Onychomycosis  Pain in right toes  Pain in left toes  Consent was obtained for treatment procedures.  ROV.  Mechanical debridement of nails 1-5  bilaterally performed with a nail nipper.  Filed with dremel without incident. Gave patient toe cap.   Return office visit   3 months                   Told patient to return for periodic foot care and evaluation due to potential at risk complications.   Cordella Bold DPM

## 2023-12-15 ENCOUNTER — Other Ambulatory Visit: Payer: Self-pay | Admitting: Neurology

## 2023-12-15 DIAGNOSIS — G20A1 Parkinson's disease without dyskinesia, without mention of fluctuations: Secondary | ICD-10-CM

## 2023-12-16 NOTE — Progress Notes (Unsigned)
 Assessment/Plan:   1.  Parkinsons Disease  -Increase carbidopa /levodopa  25/100, 2/2/2  -start carbidopa /levodopa  50/200 at bed  -discussed vyalev but he is not interested  -he asks about ivermectin and I told him that this is not a medication for Parkinsons Disease.  -discussed surgical intervention for tremor  -Follows regularly with dermatology.  -let me know when want PT referral  2.  REM behavior disorder  -Patient does not want any medication.  Understands bedroom safety.  Seems to be doing fairly well in that regard.  3.  Intermittent lightheadedness  -Following with primary care.  It was felt that this may be due to intermittent hypoglycemia.  -He is on lisinopril .  4.  Syncopal episode  -occurred in church while seated in 2024.  Patient thought due to dehydration.  I am not convinced given that he was seated.  Has not recurred  5.  Insomnia  -is actually sleeping pretty well at night and just getting up early and feels well rested  -sleeping spontaneously in day.  Discussed scheduled naps instead and not after 2pm and no longer than 1 hour.  Is currently taking several hour nap after dinner and needs to avoid that  -doesn't want medication right now Subjective:  Discussed the use of AI scribe software for clinical note transcription with the patient, who gave verbal consent to proceed.  History of Present Illness Bryce Wood is a 78 year old male with Parkinson's disease who presents for follow-up.  He is currently taking carbidopa /levodopa  25/100 mg, two tablets in the morning, two in the afternoon, and one in the evening. The medication takes about 30 minutes to take effect and wears off approximately an hour before the next dose, leading to several hours per day where he feels the medication is not effective, particularly in the evening. He experiences hand tremors, more pronounced in the right hand, and occasional shaking in the right leg. He also has difficulty  with sleep, often waking up due to hand tremors and feeling cold, which requires him to sit up in bed to calm his hands.  He has most of the off time in the evening and mostly at bedtime.   He has a history of insomnia but currently sleeps well at night, waking up early and feeling well-rested. He tends to fall asleep spontaneously in the evening after dinner and sleeps several hours after dinner  No recent passing out spells or hallucinations. His right and left hands shake, and his right leg occasionally shakes at night. He had surgery on his right side, which has left some numbness, but he does not experience significant shaking there.   Current prescribed movement disorder medications: Carbidopa /levodopa  25/100, 2/2/1  ALLERGIES:  No Known Allergies  CURRENT MEDICATIONS:  Outpatient Encounter Medications as of 12/18/2023  Medication Sig   acetaminophen  (TYLENOL ) 650 MG CR tablet Take 650 mg by mouth 3 (three) times daily.   carbidopa -levodopa  (SINEMET  IR) 25-100 MG tablet TAKE 2 TABLETS AT 7AM, THEN TAKE 2 TABLETS AT 11AM, THEN TAKE 1 TABLET AT 4PM   cholecalciferol (VITAMIN D) 1000 UNITS tablet Take 2,000 Units by mouth daily.   fish oil-omega-3 fatty acids 1000 MG capsule Take 1 g by mouth daily.    lisinopril  (ZESTRIL ) 20 MG tablet Take 1 tablet (20 mg total) by mouth daily.   TURMERIC PO Take 1 tablet by mouth in the morning and at bedtime.    No facility-administered encounter medications on file as of 12/18/2023.  Objective:   PHYSICAL EXAMINATION:    VITALS:   Vitals:   12/18/23 0807  BP: 130/60  Pulse: 88  Resp: 20  SpO2: 99%  Weight: 180 lb (81.6 kg)      GEN:  The patient appears stated age and is in NAD. HEENT:  Normocephalic, atraumatic.  The mucous membranes are moist. The superficial temporal arteries are without ropiness or tenderness. CV:  RRR Lungs:  CTAB Neck/HEME:  There are no carotid bruits bilaterally.  Neurological  examination:  Orientation: The patient is alert and oriented x3. Cranial nerves: There is good facial symmetry with mild facial hypomimia. The speech is fluent and clear. Soft palate rises symmetrically and there is no tongue deviation. Hearing is intact to conversational tone. Sensation: Sensation is intact to light touch throughout Motor: Strength is at least antigravity x4.  Movement examination: Tone: There is mild to mod increased tone in the RUE Abnormal movements: There is RUE rest tremor (mod amplitude)>LUE rest tremor (mild to mod);  there is mild head dyskinesia with distraction Coordination:  There is mild decremation with hand opening and closing on the R.  All other RAMs are nl Gait and Station: The patient has no difficulty arising out of a deep-seated chair without the use of the hands. The patient is wide based and flexed at knees. moderate shuffle and drags R leg a little.  He is somewhat unstable.    I have reviewed and interpreted the following labs independently    Chemistry      Component Value Date/Time   NA 138 02/05/2023 0753   K 4.4 02/05/2023 0753   CL 102 02/05/2023 0753   CO2 29 02/05/2023 0753   BUN 13 02/05/2023 0753   CREATININE 0.99 02/05/2023 0753      Component Value Date/Time   CALCIUM 10.0 02/05/2023 0753   ALKPHOS 56 02/05/2023 0753   AST 17 02/05/2023 0753   ALT 3 02/05/2023 0753   BILITOT 0.4 02/05/2023 0753       Lab Results  Component Value Date   WBC 4.7 01/03/2021   HGB 13.9 01/03/2021   HCT 42.2 01/03/2021   MCV 93.8 01/03/2021   PLT 251.0 01/03/2021    Lab Results  Component Value Date   TSH 1.66 01/03/2021   Total time spent on today's visit was 30 minutes, including both face-to-face time and nonface-to-face time.  Time included that spent on review of records (prior notes available to me/labs/imaging if pertinent), discussing treatment and goals, answering patient's questions and coordinating care.    Cc:  Micheal Wolm ORN, MD

## 2023-12-18 ENCOUNTER — Ambulatory Visit: Admitting: Neurology

## 2023-12-18 ENCOUNTER — Encounter: Payer: Self-pay | Admitting: Neurology

## 2023-12-18 VITALS — BP 130/60 | HR 88 | Resp 20 | Wt 180.0 lb

## 2023-12-18 DIAGNOSIS — G20B2 Parkinson's disease with dyskinesia, with fluctuations: Secondary | ICD-10-CM

## 2023-12-18 DIAGNOSIS — G4700 Insomnia, unspecified: Secondary | ICD-10-CM | POA: Diagnosis not present

## 2023-12-18 MED ORDER — CARBIDOPA-LEVODOPA ER 50-200 MG PO TBCR: 1.0000 | EXTENDED_RELEASE_TABLET | Freq: Every day | ORAL | 1 refills | Status: AC

## 2023-12-18 NOTE — Patient Instructions (Signed)
  VISIT SUMMARY: Today, you had a follow-up appointment to discuss your Parkinson's disease and related symptoms. We reviewed your current medications and made some adjustments to help manage your symptoms more effectively, especially in the evening. We also discussed your sleep issues and lightheadedness.  YOUR PLAN: -PARKINSON'S DISEASE: Parkinson's disease is a disorder of the central nervous system that affects movement, often including tremors. To help manage your symptoms, we have increased your evening dose of carbidopa -levodopa  to two tablets and added a controlled-release tablet at bedtime. Please take your medication away from protein intake for better absorption. We also discussed the potential future use of a carbidopa -levodopa  pump, although you noted you are not interested right now  -REM SLEEP BEHAVIOR DISORDER: REM sleep behavior disorder involves acting out dreams during sleep. You are aware of the necessary bedroom safety measures and have chosen not to start medication at this time. We will continue with the current management plan.  -INTERMITTENT LIGHTHEADEDNESS: Intermittent lightheadedness can be caused by various factors, including low blood sugar. You will continue to follow up with your primary care provider to manage this issue.  -HISTORY OF INSOMNIA: Insomnia is difficulty falling or staying asleep. Although you sleep well at night, you tend to nap excessively during the day. To improve your nighttime sleep quality, avoid napping after 2 PM.  INSTRUCTIONS: Continue with the new medication regimen and avoid napping after 2 PM to improve your nighttime sleep quality.                       Contains text generated by [employer]bridge.                                 Contains text generated by [employer]bridge.

## 2023-12-19 ENCOUNTER — Telehealth: Payer: Self-pay | Admitting: Neurology

## 2023-12-19 NOTE — Telephone Encounter (Signed)
 Pt said he was at office yesterday 12/18/23 and Tat was going to send in a controlled released medication for bedtime but they have not received that yet. He would like a call  back

## 2023-12-19 NOTE — Telephone Encounter (Signed)
 Telephone call to pharmacy, Per [facility] received. Message states not in inventory. Will order.   Patient advised.

## 2024-01-27 ENCOUNTER — Telehealth: Payer: Self-pay | Admitting: Neurology

## 2024-01-27 NOTE — Telephone Encounter (Signed)
 Pt still is not sleeping good after Rx change, would like to discuss next steps with nurse or Doctor

## 2024-01-27 NOTE — Telephone Encounter (Signed)
"   Insomnia             -is actually sleeping pretty well at night and just getting up early and feels well rested             -sleeping spontaneously in day.  Discussed scheduled naps instead and not after 2pm and no longer than 1 hour.  Is currently taking several hour nap after dinner and needs to avoid that             -doesn't want medication right now "

## 2024-01-28 NOTE — Telephone Encounter (Signed)
 Patient he said he would like to D/C the CL 50/200 and go back to 25/100 2 / 2 / 1. He also said he is not taking naps during the day and would love to have help sleeping

## 2024-01-28 NOTE — Telephone Encounter (Signed)
 He previously told us  he was waking from hand tremors at night and that is why we have him on the 50/200 CR at night.  If he no longer has that, then its probably working.  It was never to help him sleep.  You can send in RX for trazodone , 50 mg q hs, #90, RF 1

## 2024-01-29 ENCOUNTER — Other Ambulatory Visit: Payer: Self-pay

## 2024-01-29 DIAGNOSIS — G20B2 Parkinson's disease with dyskinesia, with fluctuations: Secondary | ICD-10-CM

## 2024-01-29 MED ORDER — TRAZODONE HCL 50 MG PO TABS: 50.0000 mg | ORAL_TABLET | Freq: Every day | ORAL | 1 refills | Status: AC

## 2024-01-29 NOTE — Telephone Encounter (Signed)
 Tauheed called back stating that he spoke  with [facility] earlier, and was suppose to get a follow up call. He is requesting a call back. He stated that he is heading to an appt that's from 4 to 4:30. He would like a call back.

## 2024-01-29 NOTE — Telephone Encounter (Signed)
Called patient and sent in RX

## 2024-01-30 ENCOUNTER — Telehealth: Payer: Self-pay | Admitting: Neurology

## 2024-01-30 NOTE — Telephone Encounter (Signed)
 Left a message with the after hour service on 01-30-24 at 12:15 pm   Caller is waiting a call back from the nurse that works with Dr Tat

## 2024-01-30 NOTE — Telephone Encounter (Signed)
Called pateint and left message  

## 2024-02-06 ENCOUNTER — Ambulatory Visit (INDEPENDENT_AMBULATORY_CARE_PROVIDER_SITE_OTHER): Payer: PPO | Admitting: Family Medicine

## 2024-02-06 ENCOUNTER — Encounter: Payer: Self-pay | Admitting: Family Medicine

## 2024-02-06 VITALS — BP 110/60 | HR 90 | Temp 97.5°F | Ht 71.26 in | Wt 177.0 lb

## 2024-02-06 DIAGNOSIS — M6281 Muscle weakness (generalized): Secondary | ICD-10-CM

## 2024-02-06 DIAGNOSIS — Z0000 Encounter for general adult medical examination without abnormal findings: Secondary | ICD-10-CM

## 2024-02-06 LAB — LIPID PANEL
Cholesterol: 192 mg/dL (ref 28–200)
HDL: 58.1 mg/dL
LDL Cholesterol: 112 mg/dL — ABNORMAL HIGH (ref 10–99)
NonHDL: 134.04
Total CHOL/HDL Ratio: 3
Triglycerides: 110 mg/dL (ref 10.0–149.0)
VLDL: 22 mg/dL (ref 0.0–40.0)

## 2024-02-06 LAB — HEPATIC FUNCTION PANEL
ALT: 2 U/L — ABNORMAL LOW (ref 3–53)
AST: 16 U/L (ref 5–37)
Albumin: 4.6 g/dL (ref 3.5–5.2)
Alkaline Phosphatase: 54 U/L (ref 39–117)
Bilirubin, Direct: 0.1 mg/dL (ref 0.1–0.3)
Total Bilirubin: 0.8 mg/dL (ref 0.2–1.2)
Total Protein: 7.4 g/dL (ref 6.0–8.3)

## 2024-02-06 LAB — CBC WITH DIFFERENTIAL/PLATELET
Basophils Absolute: 0 K/uL (ref 0.0–0.1)
Basophils Relative: 0.9 % (ref 0.0–3.0)
Eosinophils Absolute: 0.1 K/uL (ref 0.0–0.7)
Eosinophils Relative: 1.5 % (ref 0.0–5.0)
HCT: 40.7 % (ref 39.0–52.0)
Hemoglobin: 13.7 g/dL (ref 13.0–17.0)
Lymphocytes Relative: 38.4 % (ref 12.0–46.0)
Lymphs Abs: 1.8 K/uL (ref 0.7–4.0)
MCHC: 33.6 g/dL (ref 30.0–36.0)
MCV: 93 fl (ref 78.0–100.0)
Monocytes Absolute: 0.6 K/uL (ref 0.1–1.0)
Monocytes Relative: 13.1 % — ABNORMAL HIGH (ref 3.0–12.0)
Neutro Abs: 2.2 K/uL (ref 1.4–7.7)
Neutrophils Relative %: 46.1 % (ref 43.0–77.0)
Platelets: 252 K/uL (ref 150.0–400.0)
RBC: 4.37 Mil/uL (ref 4.22–5.81)
RDW: 12.8 % (ref 11.5–15.5)
WBC: 4.7 K/uL (ref 4.0–10.5)

## 2024-02-06 LAB — BASIC METABOLIC PANEL WITH GFR
BUN: 28 mg/dL — ABNORMAL HIGH (ref 6–23)
CO2: 30 meq/L (ref 19–32)
Calcium: 9.9 mg/dL (ref 8.4–10.5)
Chloride: 102 meq/L (ref 96–112)
Creatinine, Ser: 1.28 mg/dL (ref 0.40–1.50)
GFR: 54.1 mL/min — ABNORMAL LOW
Glucose, Bld: 93 mg/dL (ref 70–99)
Potassium: 4.6 meq/L (ref 3.5–5.1)
Sodium: 138 meq/L (ref 135–145)

## 2024-02-06 NOTE — Progress Notes (Signed)
 "  Established Patient Office Visit  Subjective   Patient ID: Bryce Wood, male    DOB: 1945-09-01  Age: 79 y.o. MRN: [id number]  Chief Complaint  Patient presents with   Annual Exam    HPI    [name] is here for annual physical exam.  He has history of hypertension, Parkinson's disease, osteoarthritis involving multiple joints, hyperlipidemia.  He continues to battle Parkinson's.  He still play some golf.  Has had some progressive tremor specially right upper extremity.  Followed closely by neurology.  He has some chronic intermittent insomnia.  Was prescribed trazodone  but taking infrequently.  He has noticed some decrease strength right upper extremity and would like to get back into physical therapy.  Has benefited from therapy in the past.  Current medications include Sinemet , lisinopril , and trazodone  50 mg as needed at bedtime for insomnia.  Health maintenance reviewed:  Health Maintenance  Topic Date Due   Medicare Annual Wellness (AWV)  01/01/2022   COVID-19 Vaccine (6 - 2025-26 season) 09/15/2023   Zoster Vaccines- Shingrix (1 of 2) 05/06/2024 (Originally 11/22/1996)   DTaP/Tdap/Td (4 - Td or Tdap) 02/23/2027   Pneumococcal Vaccine: 50+ Years  Completed   Influenza Vaccine  Completed   Hepatitis C Screening  Completed   Meningococcal B Vaccine  Aged Out   Fecal DNA (Cologuard)  Discontinued   - Aged out of further screening colonoscopy - Flu vaccine already given - Declines shingles vaccine - Pneumonia vaccine complete  Social History   Socioeconomic History   Marital status: Married    Spouse name: Not on file   Number of children: 1   Years of education: Not on file   Highest education level: Some college, no degree  Occupational History   Occupation: retired    Comment: psychologist, occupational  Tobacco Use   Smoking status: Never   Smokeless tobacco: Never  Vaping Use   Vaping status: Never Used  Substance and Sexual Activity   Alcohol use: No   Drug use:  No   Sexual activity: Not on file  Other Topics Concern   Not on file  Social History Narrative   RIGHT HANDED   ONE STORY    LIVES WITH SPOUSE   Social Drivers of Health   Tobacco Use: Low Risk (02/06/2024)   Patient History    Smoking Tobacco Use: Never    Smokeless Tobacco Use: Never    Passive Exposure: Not on file  Financial Resource Strain: Not on file  Food Insecurity: Not on file  Transportation Needs: Not on file  Physical Activity: Not on file  Stress: Not on file  Social Connections: Not on file  Intimate Partner Violence: Not on file  Depression (PHQ2-9): Low Risk (02/06/2024)   Depression (PHQ2-9)    PHQ-2 Score: 0  Alcohol Screen: Not on file  Housing: Not on file  Utilities: Not on file  Health Literacy: Not on file   Family History  Problem Relation Age of Onset   Heart disease Mother    Heart attack Mother    Hypertension Father    Stroke Father    Leukemia Father    Healthy Daughter    Cancer Paternal Uncle 30       prostate cancer   Hypertension Brother    Past Medical History:  Diagnosis Date   ARTHRITIS 08/01/2008   Cancer (HCC) 01/2019   basal cell removed from face   HYPERLIPIDEMIA 11/21/2008   HYPERTENSION 08/01/2008   OSTEOARTHRITIS 08/01/2008  Parkinson's disease [facility])    Past Surgical History:  Procedure Laterality Date   CIRCUMCISION N/A 04/22/2019   Procedure: CIRCUMCISION ADULT;  Surgeon: Watt Rush, MD;  Location: [facility];  Service: Urology;  Laterality: N/A;   circumcison     HERNIA REPAIR  1979   inguinal   SPINE SURGERY  1999   disk l4 to l5    reports that he has never smoked. He has never used smokeless tobacco. He reports that he does not drink alcohol and does not use drugs. family history includes Cancer (age of onset: 32) in his paternal uncle; Healthy in his daughter; Heart attack in his mother; Heart disease in his mother; Hypertension in his brother and father; Leukemia in his father; Stroke in his  father. Allergies[1]    Review of Systems  Constitutional:  Negative for malaise/fatigue.  Eyes:  Negative for blurred vision.  Respiratory:  Negative for shortness of breath.   Cardiovascular:  Negative for chest pain.  Gastrointestinal:  Negative for abdominal pain.  Neurological:  Positive for tremors. Negative for dizziness, speech change, seizures, loss of consciousness and headaches.      Objective:     BP 110/60   Pulse 90   Temp (!) 97.5 F (36.4 C) (Oral)   Ht 5' 11.26 (1.81 m)   Wt 177 lb (80.3 kg)   SpO2 96%   BMI 24.51 kg/m  BP Readings from Last 3 Encounters:  02/06/24 110/60  12/18/23 130/60  07/08/23 116/60   Wt Readings from Last 3 Encounters:  02/06/24 177 lb (80.3 kg)  12/18/23 180 lb (81.6 kg)  07/08/23 175 lb 9.6 oz (79.7 kg)      Physical Exam Vitals reviewed.  Constitutional:      General: He is not in acute distress.    Appearance: He is well-developed. He is not ill-appearing.  HENT:     Right Ear: External ear normal.     Left Ear: External ear normal.  Eyes:     Pupils: Pupils are equal, round, and reactive to light.  Neck:     Thyroid : No thyromegaly.  Cardiovascular:     Rate and Rhythm: Normal rate and regular rhythm.  Pulmonary:     Effort: Pulmonary effort is normal. No respiratory distress.     Breath sounds: Normal breath sounds. No wheezing or rales.  Musculoskeletal:     Cervical back: Neck supple.  Neurological:     General: No focal deficit present.     Mental Status: He is alert and oriented to person, place, and time.     Cranial Nerves: No cranial nerve deficit.     Comments: Prominent tremor consistent with his Parkinson's disease      No results found for any visits on 02/06/24.  Last CBC Lab Results  Component Value Date   WBC 4.7 01/03/2021   HGB 13.9 01/03/2021   HCT 42.2 01/03/2021   MCV 93.8 01/03/2021   RDW 13.1 01/03/2021   PLT 251.0 01/03/2021   Last metabolic panel Lab Results   Component Value Date   GLUCOSE 83 02/05/2023   NA 138 02/05/2023   K 4.4 02/05/2023   CL 102 02/05/2023   CO2 29 02/05/2023   BUN 13 02/05/2023   CREATININE 0.99 02/05/2023   GFR 74.16 02/05/2023   CALCIUM 10.0 02/05/2023   PROT 7.5 02/05/2023   ALBUMIN 4.4 02/05/2023   BILITOT 0.4 02/05/2023   ALKPHOS 56 02/05/2023   AST 17 02/05/2023  ALT 3 02/05/2023   Last lipids Lab Results  Component Value Date   CHOL 171 02/05/2023   HDL 51.30 02/05/2023   LDLCALC 90 02/05/2023   LDLDIRECT 111.0 11/19/2017   TRIG 151.0 (H) 02/05/2023   CHOLHDL 3 02/05/2023      The 89-bzjm ASCVD risk score (Arnett DK, et al., 2019) is: 24.2%    Assessment & Plan:   Physical exam.  79 year old male with Parkinson disease.  He has hypertension treated with lisinopril .  No recent orthostasis.  He had some intermittent insomnia and is using trazodone  for that.  He had some generalized weakness especially right upper extremity would like to get back into see physical therapist.  Order placed.  We discussed several health maintenance issues as follows  - Flu vaccine already given -Discussed Shingrix and he declines -Aged out of further colonoscopy -Obtain follow-up labs.  Discussed pros and cons of PSA screening and he declines -Consider RSV vaccine at local pharmacy No follow-ups on file.    Wolm Scarlet, MD     [1] No Known Allergies  "

## 2024-02-09 ENCOUNTER — Ambulatory Visit: Payer: Self-pay | Admitting: Family Medicine

## 2024-02-17 ENCOUNTER — Ambulatory Visit: Admitting: Podiatry

## 2024-02-17 ENCOUNTER — Ambulatory Visit: Admitting: Physical Therapy

## 2024-02-25 ENCOUNTER — Ambulatory Visit: Admitting: Physical Therapy

## 2024-03-02 ENCOUNTER — Ambulatory Visit: Admitting: Physical Therapy

## 2024-06-24 ENCOUNTER — Ambulatory Visit: Admitting: Neurology
